# Patient Record
Sex: Male | Born: 1950
Health system: Southern US, Community
[De-identification: ages and names within clinical notes are randomized; demographics above are authoritative.]

## PROBLEM LIST (undated history)

## (undated) DIAGNOSIS — N200 Calculus of kidney: Secondary | ICD-10-CM

## (undated) DIAGNOSIS — E785 Hyperlipidemia, unspecified: Secondary | ICD-10-CM

## (undated) DIAGNOSIS — I2510 Atherosclerotic heart disease of native coronary artery without angina pectoris: Secondary | ICD-10-CM

## (undated) DIAGNOSIS — M109 Gout, unspecified: Secondary | ICD-10-CM

## (undated) DIAGNOSIS — C801 Malignant (primary) neoplasm, unspecified: Secondary | ICD-10-CM

## (undated) DIAGNOSIS — K219 Gastro-esophageal reflux disease without esophagitis: Secondary | ICD-10-CM

## (undated) DIAGNOSIS — K635 Polyp of colon: Secondary | ICD-10-CM

## (undated) DIAGNOSIS — H8102 Meniere's disease, left ear: Secondary | ICD-10-CM

## (undated) DIAGNOSIS — M1990 Unspecified osteoarthritis, unspecified site: Secondary | ICD-10-CM

## (undated) DIAGNOSIS — J9601 Acute respiratory failure with hypoxia: Secondary | ICD-10-CM

## (undated) DIAGNOSIS — I10 Essential (primary) hypertension: Secondary | ICD-10-CM

## (undated) HISTORY — DX: Calculus of kidney: N20.0

## (undated) HISTORY — PX: TONSILLECTOMY: SUR1361

## (undated) HISTORY — DX: Hyperlipidemia, unspecified: E78.5

## (undated) HISTORY — DX: Polyp of colon: K63.5

## (undated) HISTORY — DX: Meniere's disease, left ear: H81.02

## (undated) HISTORY — DX: Essential (primary) hypertension: I10

## (undated) HISTORY — DX: Gout, unspecified: M10.9

## (undated) HISTORY — DX: Atherosclerotic heart disease of native coronary artery without angina pectoris: I25.10

## (undated) HISTORY — DX: Gastro-esophageal reflux disease without esophagitis: K21.9

## (undated) HISTORY — DX: Malignant (primary) neoplasm, unspecified: C80.1

## (undated) HISTORY — DX: Unspecified osteoarthritis, unspecified site: M19.90

## (undated) HISTORY — DX: Acute respiratory failure with hypoxia: J96.01

## (undated) HISTORY — PX: ROTATOR CUFF REPAIR: SHX139

---

## 2000-09-26 ENCOUNTER — Encounter: Admission: RE | Admit: 2000-09-26 | Discharge: 2000-12-25 | Payer: Self-pay | Admitting: *Deleted

## 2003-10-18 ENCOUNTER — Encounter (INDEPENDENT_AMBULATORY_CARE_PROVIDER_SITE_OTHER): Payer: Self-pay | Admitting: Specialist

## 2003-10-18 ENCOUNTER — Ambulatory Visit (HOSPITAL_COMMUNITY): Admission: RE | Admit: 2003-10-18 | Discharge: 2003-10-18 | Payer: Self-pay | Admitting: Gastroenterology

## 2009-03-22 ENCOUNTER — Ambulatory Visit: Payer: Self-pay | Admitting: Sports Medicine

## 2009-03-22 DIAGNOSIS — M7520 Bicipital tendinitis, unspecified shoulder: Secondary | ICD-10-CM | POA: Insufficient documentation

## 2009-03-22 DIAGNOSIS — M25519 Pain in unspecified shoulder: Secondary | ICD-10-CM | POA: Insufficient documentation

## 2009-04-12 ENCOUNTER — Ambulatory Visit: Payer: Self-pay | Admitting: Sports Medicine

## 2009-04-20 ENCOUNTER — Emergency Department (HOSPITAL_COMMUNITY): Admission: EM | Admit: 2009-04-20 | Discharge: 2009-04-20 | Payer: Self-pay | Admitting: Emergency Medicine

## 2009-05-11 ENCOUNTER — Ambulatory Visit: Payer: Self-pay | Admitting: Sports Medicine

## 2010-02-20 NOTE — Assessment & Plan Note (Signed)
Summary: L SHOULDER STRAIN,MC   Vital Signs:  Patient profile:   60 year old male Height:      67 inches Weight:      165 pounds BMI:     25.94 BP sitting:   136 / 88  Vitals Entered By: Lillia Pauls CMA (March 22, 2009 9:31 AM)  CC:  left shoulder and bicep pain.  History of Present Illness: Pt has had left shoulder and bicep pain for about 3 weeks.  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  He notices that putting on shirts, reaching behind him, and certain rotations of the arm makes the pain worse.  Right now he has a constant mild ache but those motions bring on a sharp worsening of it.  Pain is located in the area of the bicipital groove and comes down into the bicep some. He has tried some advil with little relief.  Problems Prior to Update: 1)  Biceps Tendinitis, Left  (ICD-726.12)  Medications Prior to Update: 1)  None  Allergies (verified): No Known Drug Allergies  Review of Systems MS:  Complains of joint pain, muscle aches, and muscle weakness. Neuro:  Denies numbness and tingling.  Physical Exam  General:  Well-developed,well-nourished,in no acute distress; alert,appropriate and cooperative throughout examination Msk:  Shoulder: Inspection reveals no abnormalities, atrophy or asymmetry. Palpation reveals mild TTP at the bicipital groove. ROM is mostly normal but limited some with apley's scratch test on the left. Rotator cuff strength weak on the left especially external rotation, internal rotation strength normal. Speeds and Yergason's tests are both positive with significant pain at the bicipital groove and some weakness. No labral pathology noted with negative Obrien's, negative clunk and good stability. Normal scapular function observed. No painful arc and no drop arm sign. No apprehension sign No signs of impingement with negative Neer and Hawkin's tests, equivocal empty can.   U/S demonstrates  significant amount of edema within the left biceps tendon and partial tearing with minute calcifications  There is a partial tear on longitudinal scanning that looks to involve 40% of tendon thickness and has an associated fluid collection  Scanning of AC joint, Rotator cuff mm revealed all to appear normal  images saved   Neurologic:  sensation is intact int he upper extremities b/l Psych:  Cognition and judgment appear intact. Alert and cooperative with normal attention span and concentration.    Impression & Recommendations:  Problem # 1:  BICEPS TENDINITIS, LEFT (ICD-726.12)  partial tearing, large amount of swelling and edema within the tendon ice massage several times daily topical NSAID QID   - will use ketoprofen gel 20% light exercises prescribed to maintain ROM and strength return in 3 weeks to reevaluate  will need to rescan at that point  Orders: [place] LIMITED ([id number])  Problem # 2:  SHOULDER PAIN, LEFT (ICD-719.41)  prob 2/2 BT as noted above poain tolerable will allow OTC NSAIDs and topicals as above  Orders: [place] LIMITED ([id number])

## 2010-02-20 NOTE — Assessment & Plan Note (Signed)
Summary: f/u,mc   Vital Signs:  Patient profile:   60 year old male BP sitting:   129 / 87  Vitals Entered By: Lillia Pauls CMA (May 11, 2009 1:47 PM)  History of Present Illness: Nathaniel Bender is F/u of left Biciptial tendon partial tear state that pain is 80% less started lessening after NTG use fairly quickly still cautious w lifting pain only on heavier lift or on ER of arm though no real sideeffects w NTG p first few days of mild HA  Allergies: No Known Drug Allergies  Physical Exam  General:  Well-developed,well-nourished,in no acute distress; alert,appropriate and cooperative throughout examination Msk:  no deformity of left upper arm full ROM of shoulder speeds and yergason tests are not painful on mild to moderate resistance however, stronger resistance does cause some pain  ER w arm extension is still painful Additional Exam:  MSK [place] Scan is repeated today to track tendon healing now there is an area of fluid collection that surrounds BT at area of injury that is dec compared to other scans tendon is now 80% restored to norm structure with gap and tearing resolving comparison w 2 prior scnas show steady dec in fluid and steady inc in tendon fibers  images saved   Impression & Recommendations:  Problem # 1:  SHOULDER PAIN, LEFT (ICD-719.41)  pain is much less not using any meds most days  Orders: [place] LIMITED ([id number])  Problem # 2:  BICEPS TENDINITIS, LEFT (ICD-726.12)  this appears to be healign very well The high grade partial tear > 60% tendon width now appears down about 20% tendon width will cont NTG for at least 1 more mo if this is improved on RTC in 5 wks will prob be able to stop NTG  Orders: [place] LIMITED ([id number])  Complete Medication List: 1)  Nitroglycerin 0.2 Mg/hr Pt24 (Nitroglycerin) .... Use 1/4 patch once daily as directed 2)  Simvastatin 40 Mg Tabs (Simvastatin) .... [place] 1 qd 3)  Omeprazole 20 Mg Cpdr (Omeprazole) .Marland Kitchen.. 1 by mouth qd 4)   Amlodipine Besylate 5 Mg Tabs (Amlodipine besylate) .Marland Kitchen.. 1 by mouth qd Prescriptions: NITROGLYCERIN 0.2 MG/HR PT24 (NITROGLYCERIN) use 1/4 patch once daily as directed  #30 x 2   Entered by:   Lillia Pauls CMA   Authorized by:   Enid Baas MD   Signed by:   Lillia Pauls CMA on 05/11/2009   Method used:   [facility] to        [facility]  [place] [address] [facility]retail[facility]       [address]       [place], [place]  [address]       Ph: [phone] or [phone]       Fax: [phone]   RxID:   [id number]

## 2010-02-20 NOTE — Assessment & Plan Note (Signed)
Summary: f/u,mc   Vital Signs:  Patient profile:   60 year old male BP sitting:   138 / 80  Vitals Entered By: Lillia Pauls CMA (April 12, 2009 3:25 PM)  History of Present Illness: Nathaniel Bender returns follow of partial biceps tendon tear on left He has noted some mild improvement but still painful avoiding lifting anything heavy orig injury prob usign pitch fork  volt cream caused rash and so he could not use it but for 8 days after that pain has not continued to improve  Allergies: No Known Drug Allergies  Physical Exam  General:  Well-developed,well-nourished,in no acute distress; alert,appropriate and cooperative throughout examination Msk:  less pain on testing today Inspection reveals no abnormalities or assymetry; no atrophy noted; palpation is unremarkable;  ROM is full in all planes. specific strength testing of Rotator cuff mm reveals good strength throughout; no signs of impingement;  speeds and yergason's tests he can do with moderate resistance today which is better however on supination of forearm or on ER he gets pain over upper humerus/ biciptial groove .  negative painful arc and no drop arm sign.  Additional Exam:  MSK [place] There is no real change from 3 weeks ago w exception that there may be a few more fibers noted in long view tear is 2 cm distal to interval seen well on long and trans view   Impression & Recommendations:  Problem # 1:  SHOULDER PAIN, LEFT (ICD-719.41) only modest change  begin some easy motion exercises with light weight  no lifting  Problem # 2:  BICEPS TENDINITIS, LEFT (ICD-726.12) considering deg of tear will try on trial of ntg use daily as directed reck 1 mo  Complete Medication List: 1)  Nitroglycerin 0.2 Mg/hr Pt24 (Nitroglycerin) .... Use 1/4 patch once daily as directed 2)  Simvastatin 40 Mg Tabs (Simvastatin) .... [place] 1 qd 3)  Omeprazole 20 Mg Cpdr (Omeprazole) .Marland Kitchen.. 1 by mouth qd 4)  Amlodipine Besylate 5 Mg Tabs  (Amlodipine besylate) .Marland Kitchen.. 1 by mouth qd Prescriptions: NITROGLYCERIN 0.2 MG/HR PT24 (NITROGLYCERIN) use 1/4 patch once daily as directed  #30 x 1   Entered and Authorized by:   Enid Baas MD   Signed by:   Enid Baas MD on 04/12/2009   Method used:   [facility]        [facility]  [place] [address] [facility]       [address]       [place], [place]  [address]       Ph: [phone] or [phone]       Fax: [phone]   RxID:   [id number]

## 2010-06-08 NOTE — Op Note (Signed)
NAME:  Nathaniel Bender, Nathaniel Bender NO.:  [id number]   MEDICAL RECORD NO.:  [id number]          PATIENT TYPE:  AMB   LOCATION:  ENDO                         FACILITY:  [facility]   PHYSICIAN:  Graylin Shiver, M.D.   DATE OF BIRTH:  October 11, 1950   DATE OF PROCEDURE:  10/18/2003  DATE OF DISCHARGE:                                 OPERATIVE REPORT   PROCEDURE:  Colonoscopy with polypectomy.   SURGEON:   INDICATIONS FOR PROCEDURE:  Screening.   Informed consent was obtained after explanation of the risks of bleeding,  infection and perforation.   PREMEDICATION:  Fentanyl 75 mcg IV and Versed 7.5 mg IV.   DESCRIPTION OF PROCEDURE:  With the patient in the left lateral decubitus  position, a rectal examination was performed.  No masses were felt.  The  Olympus colonoscope was inserted into the rectum and advanced around the  colon to the cecum.  Cecal landmarks were identified.  The cecum and  ascending colon were normal.  The transverse colon was normal.  In the mid  descending colon there was a 5 mm sessile polyp snared and removed by snare  cautery technique.  The cautery site looked good and the polyp was  retrieved.  The sigmoid and rectum were normal.  He tolerated the procedure  well without complications.   IMPRESSION:  Small descending colon polyp.   PLAN:  The pathology will be checked.       SFG/MEDQ  D:  10/18/2003  T:  10/18/2003  Job:  [id number]   cc:   [facility] [employer]

## 2011-11-10 ENCOUNTER — Ambulatory Visit (INDEPENDENT_AMBULATORY_CARE_PROVIDER_SITE_OTHER): Payer: Managed Care, Other (non HMO) | Admitting: Family Medicine

## 2011-11-10 VITALS — BP 130/80 | HR 56 | Temp 98.0°F | Resp 17 | Ht 66.5 in | Wt 166.0 lb

## 2011-11-10 DIAGNOSIS — Z1159 Encounter for screening for other viral diseases: Secondary | ICD-10-CM

## 2011-11-10 DIAGNOSIS — Z23 Encounter for immunization: Secondary | ICD-10-CM

## 2011-11-10 DIAGNOSIS — H9319 Tinnitus, unspecified ear: Secondary | ICD-10-CM

## 2011-11-10 DIAGNOSIS — H9209 Otalgia, unspecified ear: Secondary | ICD-10-CM

## 2011-11-10 LAB — HEPATITIS C ANTIBODY: HCV Ab: NEGATIVE

## 2011-11-10 MED ORDER — PREDNISONE 20 MG PO TABS
ORAL_TABLET | ORAL | Status: DC
Start: 2011-11-10 — End: 2013-10-21

## 2011-11-10 NOTE — Progress Notes (Signed)
[facility] [address], [place] [address] [phone]  Date:  11/10/2011   Name:  Nathaniel Bender   DOB:  Aug 26, 1950   MRN:  [id number]  PCP:  Elie Confer, MD    Chief Complaint: Otalgia and Tinnitus   History of Present Illness:  Nathaniel Bender is a 61 y.o. very pleasant male patient who presents with the following:  3 or 4 days ago he noted onset of pain in his left ear which was fairly intense for a few hours.  The pain the became better.  He drove back home from vacation in [place] yesterday.  Last night he noted that he could not hear as well out of his left ear, and he heard a whistling in the left ear.  He actually thought that a friend was whistling, but it turned out to just be tinnitus.  He was then trying to practice his guitar but he could not hear well enough to do so.  No drainage from the ear.  He feels that he cannot hear sounds as clearly OR as loudly as usual.   No fever.  No cough, no runny nose, no ST.  Otherwise he feels well and is generally healthy.  He has not been swimming recently.    He would like to have his seasonal flu vaccine today, and would like to be screened for hep C as he has heard that this is now recommended for people in his age group.    Patient Active Problem List  Diagnosis  . SHOULDER PAIN, LEFT  . BICEPS TENDINITIS, LEFT    No past medical history on file.  No past surgical history on file.  History  Substance Use Topics  . Smoking status: Never Smoker   . Smokeless tobacco: Not on file  . Alcohol Use: Not on file    No family history on file.  No Known Allergies  Medication list has been reviewed and updated.  Current Outpatient Prescriptions on File Prior to Visit  Medication Sig Dispense Refill  . amLODipine-benazepril (LOTREL) 10-20 MG per capsule Take 1 capsule by mouth daily.      Marland Kitchen atorvastatin (LIPITOR) 10 MG tablet Take 10 mg by mouth daily.      Marland Kitchen omeprazole (PRILOSEC) 20 MG  capsule Take 20 mg by mouth daily.      . simvastatin (ZOCOR) 20 MG tablet Take 20 mg by mouth every evening.        Review of Systems:  As per HPI- otherwise negative.   Physical Examination: Filed Vitals:   11/10/11 1202  BP: 130/80  Pulse: 56  Temp: 98 F (36.7 C)  Resp: 17   Filed Vitals:   11/10/11 1202  Height: 5' 6.5" (1.689 m)  Weight: 166 lb (75.297 kg)   Body mass index is 26.39 kg/(m^2). Ideal Body Weight: Weight in (lb) to have BMI = 25: 156.9   GEN: WDWN, NAD, Non-toxic, A & O x 3 HEENT: Atraumatic, Normocephalic. Neck supple. No masses, No LAD.  Right TM and ear canal normal. Left ear canal is clear, no redness or debris.  The TM may have a tiny defect in the superior part, vs fluid behind the drum. Otherwise the drum is normal, shiny, no redness.  Oropharynx normal.  PEERL,EOMI.  Nasal cavity normal Ears and Nose: No external deformity. CV: RRR, No M/G/R. No JVD. No thrill. No extra heart sounds. PULM: CTA B, no wheezes, crackles, rhonchi. No retractions. No  resp. distress. No accessory muscle use. EXTR: No c/c/e NEURO Normal gait.  PSYCH: Normally interactive. Conversant. Not depressed or anxious appearing.  Calm demeanor.    Assessment and Plan: 1. Tinnitus  predniSONE (DELTASONE) 20 MG tablet  2. Need for hepatitis C screening test  Hepatitis C antibody  3. Ear pain  predniSONE (DELTASONE) 20 MG tablet   Leandre is having tinnitus of undetermined etiology.  Will try a short course of prednisone to see if it may resolve this problem.  If he is not better in the next couple of days we will have him see ENT for further evaluation and to look at his TM.  In the meantime he will let me know if he gets worse.  Also did Hep C screen and flu shot today.  He is aware that concomitant use of prednisone and the flu shot could decrease the efficacy of his vaccine, but he would like to go ahead and do the shot today for convenience sake.    Await hep C result and will  follow- up  Abbe Amsterdam, MD

## 2011-11-10 NOTE — Patient Instructions (Addendum)
We are going to try prednisone for your tinnitus (ringing in ears).  However, if you are not feeling better within the next couple of days please do give me a call and I will arrange for you to see ENT.  Let me know sooner if you get worse.

## 2011-11-11 NOTE — Progress Notes (Signed)
LMOM that Hep C was negative

## 2011-11-13 ENCOUNTER — Telehealth: Payer: Self-pay

## 2011-11-13 DIAGNOSIS — H9319 Tinnitus, unspecified ear: Secondary | ICD-10-CM

## 2011-11-13 DIAGNOSIS — H9209 Otalgia, unspecified ear: Secondary | ICD-10-CM

## 2011-11-13 NOTE — Telephone Encounter (Signed)
Pt called and wants referral because condition is not improving. He would like an ENT referral. He is available tomorrow or Friday until noon. Please call pt with referral. Is this ok Dr Patsy Lager?

## 2011-11-13 NOTE — Telephone Encounter (Signed)
Yes, please refer to ENT.  Can we please call tomorrow am and try to get him an appt for 10/24 or 10/25?  thanks

## 2011-11-14 NOTE — Telephone Encounter (Signed)
Pt CB and I gave him referral appt info. Pt agreed that he can make it to the appt. Faxed over referral info to [facility] at [facility]

## 2011-11-14 NOTE — Telephone Encounter (Signed)
Put order for referral in EPIC and called [facility] to set up appt. Was able to get appt today at 2:00 (pt arrive at 1:45) w/Dr Pollyann Kennedy. Tried to call pt at H, C and W #s and LM for him at each to [facility] ASAP about appt. [facility] [address], pt to bring ins card, med list and co-pay.

## 2011-11-20 ENCOUNTER — Encounter: Payer: Self-pay | Admitting: Family Medicine

## 2011-11-20 DIAGNOSIS — H9120 Sudden idiopathic hearing loss, unspecified ear: Secondary | ICD-10-CM

## 2011-11-20 HISTORY — DX: Sudden idiopathic hearing loss, unspecified ear: H91.20

## 2011-12-18 ENCOUNTER — Other Ambulatory Visit: Payer: Self-pay | Admitting: Otolaryngology

## 2011-12-18 DIAGNOSIS — H9190 Unspecified hearing loss, unspecified ear: Secondary | ICD-10-CM

## 2011-12-24 ENCOUNTER — Other Ambulatory Visit: Payer: Self-pay

## 2013-03-30 ENCOUNTER — Telehealth: Payer: Self-pay | Admitting: Internal Medicine

## 2013-03-30 MED ORDER — PREDNISONE 20 MG PO TABS
ORAL_TABLET | ORAL | Status: DC
Start: 2013-03-30 — End: 2013-10-21

## 2013-03-30 NOTE — Telephone Encounter (Signed)
Meds ordered this encounter  Medications  . predniSONE (DELTASONE) 20 MG tablet    Sig: 3/3/2/2/1/1 single daily dose for 6 days    Dispense:  12 tablet    Refill:  0   F/u if not well

## 2013-05-04 ENCOUNTER — Other Ambulatory Visit: Payer: Self-pay | Admitting: Dermatology

## 2013-05-04 DIAGNOSIS — D229 Melanocytic nevi, unspecified: Secondary | ICD-10-CM

## 2013-05-04 HISTORY — DX: Melanocytic nevi, unspecified: D22.9

## 2013-06-03 ENCOUNTER — Other Ambulatory Visit: Payer: Self-pay | Admitting: Dermatology

## 2013-07-21 DIAGNOSIS — C801 Malignant (primary) neoplasm, unspecified: Secondary | ICD-10-CM

## 2013-07-21 HISTORY — DX: Malignant (primary) neoplasm, unspecified: C80.1

## 2013-10-21 ENCOUNTER — Other Ambulatory Visit: Payer: Self-pay | Admitting: Internal Medicine

## 2013-10-21 MED ORDER — BENZONATATE 100 MG PO CAPS: 100.0000 mg | ORAL_CAPSULE | Freq: Two times a day (BID) | ORAL | Status: DC | PRN

## 2013-10-21 MED ORDER — PREDNISONE 20 MG PO TABS: ORAL_TABLET | ORAL | Status: DC

## 2013-10-21 NOTE — Progress Notes (Signed)
Cough/wheeze/fall reactive airways as is pattern resp well to pred in past Meds ordered this encounter  Medications  . predniSONE (DELTASONE) 20 MG tablet    Sig: 3/3/2/2/1/1 single daily dose for 6 days    Dispense:  12 tablet    Refill:  0  . benzonatate (TESSALON) 100 MG capsule    Sig: Take 1 capsule (100 mg total) by mouth 2 (two) times daily as needed for cough.    Dispense:  20 capsule    Refill:  0

## 2013-12-29 ENCOUNTER — Ambulatory Visit (INDEPENDENT_AMBULATORY_CARE_PROVIDER_SITE_OTHER): Payer: BC Managed Care – PPO | Admitting: Family Medicine

## 2013-12-29 ENCOUNTER — Encounter: Payer: Self-pay | Admitting: Family Medicine

## 2013-12-29 VITALS — BP 120/82 | HR 45 | Temp 98.2°F | Resp 16 | Ht 66.25 in | Wt 167.6 lb

## 2013-12-29 DIAGNOSIS — N521 Erectile dysfunction due to diseases classified elsewhere: Secondary | ICD-10-CM | POA: Diagnosis not present

## 2013-12-29 DIAGNOSIS — K219 Gastro-esophageal reflux disease without esophagitis: Secondary | ICD-10-CM

## 2013-12-29 DIAGNOSIS — E785 Hyperlipidemia, unspecified: Secondary | ICD-10-CM | POA: Diagnosis not present

## 2013-12-29 DIAGNOSIS — I10 Essential (primary) hypertension: Secondary | ICD-10-CM

## 2013-12-29 DIAGNOSIS — J9809 Other diseases of bronchus, not elsewhere classified: Secondary | ICD-10-CM

## 2013-12-29 DIAGNOSIS — Z23 Encounter for immunization: Secondary | ICD-10-CM

## 2013-12-29 DIAGNOSIS — R6882 Decreased libido: Secondary | ICD-10-CM | POA: Diagnosis not present

## 2013-12-29 MED ORDER — PREDNISONE 20 MG PO TABS: ORAL_TABLET | ORAL | Status: DC

## 2013-12-29 MED ORDER — SILDENAFIL CITRATE 100 MG PO TABS: 100.0000 mg | ORAL_TABLET | Freq: Every day | ORAL | Status: DC | PRN

## 2013-12-29 NOTE — Progress Notes (Addendum)
Subjective:    Patient ID: Nathaniel Bender, male    DOB: 12/24/1950, 63 y.o.   MRN: [id number]  12/29/2013  new pt; would like to discuss colonoscopy; and flu injection   HPI This 63 y.o. male presents to establish care.  Last physical: within past year. Colonoscopy:   2005; two polyps.   TDAP:  4 years ago.   Pneumovax: never Zostavax:  2013 Influenza:  Flu vaccine today. Eye exam:  New glasses yesterday; 2015; no g/c. Dental exam:  Every six months.  Recurrent bronchitis/bronchospasm.  Allergic induced; yearly.  Works with horses.  Prednisone works the best.  Coughing fits that last weeks that require multiple medications. Late fall or January.    [place] [facility]: too much sun; one skin cancer on R forearm six months ago.  Mother with skin cancer as well.    2013 acute hearing loss L:  Presented to [facility]; Prednisone rx; referred to ENT; Meniere's disease.  Recovered hearing completely 100% other than tinnitus.  Six weeks recovery.    Prostate screening:  Agreeable in future.  Libido/ED: onset in six months.  Admits to stressors; work is very busy. No marital issues or strain.    Hyperlipidemia: Patient reports good compliance with medication, good tolerance to medication, and good symptom control.  Maintained on Pravastatin 20mg  daily.  Denies HA/dizziness/focal weakness/paresthesias.  Hypertension: Patient reports good compliance with medication, good tolerance to medication, and good symptom control.  Maintained on Lisinopril 10mg  daily and Amlodipine 5mg  daily.  Denies CP/palp/SOB/leg swelling.  GERD: well controlled with Omeprazole 20mg  daily.  Denies n/v/d/c; denies bloody stools or melena.   Review of Systems  Constitutional: Negative for fever, chills, diaphoresis, activity change, appetite change and fatigue.  HENT: Positive for tinnitus. Negative for hearing loss.   Eyes: Negative for visual disturbance.  Respiratory: Negative for cough and shortness of breath.    Cardiovascular: Negative for chest pain, palpitations and leg swelling.  Endocrine: Negative for cold intolerance, heat intolerance, polydipsia, polyphagia and polyuria.  Neurological: Negative for dizziness, tremors, seizures, syncope, facial asymmetry, speech difficulty, weakness, light-headedness, numbness and headaches.    Past Medical History  Diagnosis Date  . Hyperlipidemia   . Hypertension   . GERD (gastroesophageal reflux disease)   . Gout   . Colon polyps   . Cancer 07/21/2013    Skin cancer unknown type  . Meniere's disease of left ear     s/p ENT evaluation; acute hearing loss; chronic tinnitus L now.  Recovered hearing completely.   Past Surgical History  Procedure Laterality Date  . Tonsillectomy     No Known Allergies History   Social History  . Marital Status: Married    Spouse Name: N/A  . Number of Children: N/A  . Years of Education: N/A   Occupational History  . Sales    Social History Main Topics  . Smoking status: Never Smoker   . Smokeless tobacco: Not on file  . Alcohol Use: 12.0 oz/week    10 Standard drinks or equivalent, 10 Glasses of wine per week  . Drug Use: No  . Sexual Activity: Yes   Other Topics Concern  . Not on file   Social History Narrative   Married. X 25 years.        Children: none      Lives: with wife       Education: College/Other. Exercise: Yes.      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   Family History  Problem Relation Age of Onset  . Hyperlipidemia Mother   . Hypertension Mother   . Diabetes Father   . Heart disease Father 33    CAD/CABG  . Hyperlipidemia Father   . Hypertension Father   . Hyperlipidemia Sister   . Hypertension Sister   . Heart disease Maternal Grandfather   . Mental illness Paternal Grandmother   . Stroke Paternal Grandfather         Objective:    BP 120/82 mmHg  Pulse 45  Temp(Src) 98.2 F (36.8 C) (Oral)  Resp 16  Ht 5' 6.25" (1.683 m)  Wt 167 lb 9.6 oz (76.023 kg)   BMI 26.84 kg/m2  SpO2 98% Physical Exam  Constitutional: He is oriented to person, place, and time. He appears well-developed and well-nourished. No distress.  HENT:  Head: Normocephalic and atraumatic.  Right Ear: External ear normal.  Left Ear: External ear normal.  Nose: Nose normal.  Mouth/Throat: Oropharynx is clear and moist.  Eyes: Conjunctivae and EOM are normal. Pupils are equal, round, and reactive to light.  Neck: Normal range of motion. Neck supple. Carotid bruit is not present. No thyromegaly present.  Cardiovascular: Normal rate, regular rhythm, normal heart sounds and intact distal pulses.  Exam reveals no gallop and no friction rub.   No murmur heard. Pulmonary/Chest: Effort normal and breath sounds normal. He has no wheezes. He has no rales.  Abdominal: Soft. Bowel sounds are normal. He exhibits no distension and no mass. There is no tenderness. There is no rebound and no guarding.  Lymphadenopathy:    He has no cervical adenopathy.  Neurological: He is alert and oriented to person, place, and time. No cranial nerve deficit.  Skin: Skin is warm and dry. No rash noted. He is not diaphoretic.  Psychiatric: He has a normal mood and affect. His behavior is normal.  Nursing note and vitals reviewed.  INFLUENZA VACCINE ADMINISTERED.     Assessment & Plan:   1. Essential hypertension, benign   2. Hyperlipidemia   3. Erectile dysfunction due to diseases classified elsewhere   4. Decreased libido   5. Flu vaccine need   6. Gastroesophageal reflux disease without esophagitis   7. Recurrent bronchospasm      1 HTN: Controlled; no changes to management; obtain labs at return visit. 2. Hyperlipidemia: controlled; no changes to management; obtain labs at return visit. 3.  GERD: controlled; no changes to management. 4.  ED:  New.  Secondary to stressors. Warrants TSH and testosterone level at next visit.  Rx for Viagra provided.  Side effects reviewed. 5. Decreased  libido: New.  Likely secondary to stressors; warrants testosterone level and TSH at next visit. 6.  S/p flu vaccine. 7. Bronchospasm recurrent: chronic; rx for Prednisone provided to use PRN.   Meds ordered this encounter  Medications  . PRESCRIPTION MEDICATION    Sig: Pravastatin 20 mg taking daily  . PRESCRIPTION MEDICATION    Sig: Lisinopril 10 mg taking daily  . OVER THE COUNTER MEDICATION    Sig: Aleve taking 3-5 tabs a week.  . sildenafil (VIAGRA) 100 MG tablet    Sig: Take 1 tablet (100 mg total) by mouth daily as needed for erectile dysfunction.    Dispense:  8 tablet    Refill:  5  . predniSONE (DELTASONE) 20 MG tablet    Sig: 3/3/2/2/1/1 single daily dose for 6 days    Dispense:  12 tablet    Refill:  0    Return  in about 6 months (around 06/30/2014) for complete physical examiniation.    Reginia Forts, M.D.  [facility] [address] [place], [place]  [address] [phone] phone [phone] fax

## 2013-12-29 NOTE — Progress Notes (Signed)
   Subjective:    Patient ID: Nathaniel Bender, male    DOB: 08/29/1950, 63 y.o.   MRN: [id number]  HPI    Review of Systems  Constitutional: Negative.   HENT: Negative.   Eyes: Negative.   Respiratory: Negative.   Cardiovascular: Negative.   Gastrointestinal: Negative.   Endocrine: Negative.   Genitourinary: Negative.   Musculoskeletal: Negative.   Skin: Negative.   Allergic/Immunologic: Negative.   Neurological: Negative.   Hematological: Negative.   Psychiatric/Behavioral: Negative.        Objective:   Physical Exam        Assessment & Plan:

## 2013-12-30 ENCOUNTER — Telehealth: Payer: Self-pay

## 2013-12-30 NOTE — Telephone Encounter (Signed)
PA needed for viagra for ED. Completed on covermymeds. Pending.

## 2013-12-31 NOTE — Telephone Encounter (Signed)
PA approved through 12/30/14. Notified pharm.

## 2014-01-19 ENCOUNTER — Encounter: Payer: Self-pay | Admitting: Family Medicine

## 2014-01-19 DIAGNOSIS — E785 Hyperlipidemia, unspecified: Secondary | ICD-10-CM | POA: Insufficient documentation

## 2014-01-19 DIAGNOSIS — I10 Essential (primary) hypertension: Secondary | ICD-10-CM | POA: Insufficient documentation

## 2014-03-29 ENCOUNTER — Encounter: Payer: Self-pay | Admitting: Family Medicine

## 2014-03-29 DIAGNOSIS — R6882 Decreased libido: Secondary | ICD-10-CM | POA: Insufficient documentation

## 2014-03-29 DIAGNOSIS — K219 Gastro-esophageal reflux disease without esophagitis: Secondary | ICD-10-CM | POA: Insufficient documentation

## 2014-03-29 DIAGNOSIS — N521 Erectile dysfunction due to diseases classified elsewhere: Secondary | ICD-10-CM | POA: Insufficient documentation

## 2014-04-18 LAB — HM COLONOSCOPY

## 2014-05-03 ENCOUNTER — Encounter: Payer: Self-pay | Admitting: *Deleted

## 2014-07-06 ENCOUNTER — Ambulatory Visit (INDEPENDENT_AMBULATORY_CARE_PROVIDER_SITE_OTHER): Payer: BLUE CROSS/BLUE SHIELD | Admitting: Family Medicine

## 2014-07-06 ENCOUNTER — Encounter: Payer: BC Managed Care – PPO | Admitting: Family Medicine

## 2014-07-06 ENCOUNTER — Other Ambulatory Visit: Payer: Self-pay | Admitting: Family Medicine

## 2014-07-06 VITALS — BP 123/75 | HR 58 | Temp 98.5°F | Resp 17 | Ht 66.5 in | Wt 169.0 lb

## 2014-07-06 DIAGNOSIS — E785 Hyperlipidemia, unspecified: Secondary | ICD-10-CM

## 2014-07-06 DIAGNOSIS — K219 Gastro-esophageal reflux disease without esophagitis: Secondary | ICD-10-CM

## 2014-07-06 DIAGNOSIS — R6882 Decreased libido: Secondary | ICD-10-CM | POA: Diagnosis not present

## 2014-07-06 DIAGNOSIS — N529 Male erectile dysfunction, unspecified: Secondary | ICD-10-CM | POA: Diagnosis not present

## 2014-07-06 DIAGNOSIS — Z131 Encounter for screening for diabetes mellitus: Secondary | ICD-10-CM

## 2014-07-06 DIAGNOSIS — Z125 Encounter for screening for malignant neoplasm of prostate: Secondary | ICD-10-CM

## 2014-07-06 DIAGNOSIS — Z0000 Encounter for general adult medical examination without abnormal findings: Secondary | ICD-10-CM

## 2014-07-06 DIAGNOSIS — I10 Essential (primary) hypertension: Secondary | ICD-10-CM

## 2014-07-06 LAB — POCT URINALYSIS DIPSTICK
Bilirubin, UA: NEGATIVE
Blood, UA: NEGATIVE
GLUCOSE UA: NEGATIVE
KETONES UA: NEGATIVE
Leukocytes, UA: NEGATIVE
Nitrite, UA: NEGATIVE
Protein, UA: NEGATIVE
SPEC GRAV UA: 1.015
Urobilinogen, UA: 0.2
pH, UA: 6.5

## 2014-07-06 LAB — CBC WITH DIFFERENTIAL/PLATELET
BASOS ABS: 0.1 10*3/uL (ref 0.0–0.1)
BASOS PCT: 1 % (ref 0–1)
EOS ABS: 0.2 10*3/uL (ref 0.0–0.7)
EOS PCT: 4 % (ref 0–5)
HCT: 44.9 % (ref 39.0–52.0)
Hemoglobin: 15.7 g/dL (ref 13.0–17.0)
Lymphocytes Relative: 40 % (ref 12–46)
Lymphs Abs: 2.3 10*3/uL (ref 0.7–4.0)
MCH: 30.7 pg (ref 26.0–34.0)
MCHC: 35 g/dL (ref 30.0–36.0)
MCV: 87.9 fL (ref 78.0–100.0)
MPV: 11.7 fL (ref 8.6–12.4)
Monocytes Absolute: 0.3 10*3/uL (ref 0.1–1.0)
Monocytes Relative: 6 % (ref 3–12)
Neutro Abs: 2.8 10*3/uL (ref 1.7–7.7)
Neutrophils Relative %: 49 % (ref 43–77)
PLATELETS: 156 10*3/uL (ref 150–400)
RBC: 5.11 MIL/uL (ref 4.22–5.81)
RDW: 13.5 % (ref 11.5–15.5)
WBC: 5.8 10*3/uL (ref 4.0–10.5)

## 2014-07-06 LAB — LIPID PANEL
CHOL/HDL RATIO: 5.5 ratio
Cholesterol: 220 mg/dL — ABNORMAL HIGH (ref 0–200)
HDL: 40 mg/dL (ref >=40)
LDL CALC: 125 mg/dL — AB (ref 0–99)
TRIGLYCERIDES: 275 mg/dL — AB (ref <150)
VLDL: 55 mg/dL — ABNORMAL HIGH (ref 0–40)

## 2014-07-06 LAB — COMPREHENSIVE METABOLIC PANEL
ALBUMIN: 5 g/dL (ref 3.5–5.2)
ALK PHOS: 59 U/L (ref 39–117)
ALT: 21 U/L (ref 0–53)
AST: 20 U/L (ref 0–37)
BILIRUBIN TOTAL: 0.8 mg/dL (ref 0.2–1.2)
BUN: 18 mg/dL (ref 6–23)
CO2: 28 mEq/L (ref 19–32)
CREATININE: 0.99 mg/dL (ref 0.50–1.35)
Calcium: 9.8 mg/dL (ref 8.4–10.5)
Chloride: 100 mEq/L (ref 96–112)
GLUCOSE: 97 mg/dL (ref 70–99)
Potassium: 4.2 mEq/L (ref 3.5–5.3)
Sodium: 138 mEq/L (ref 135–145)
Total Protein: 7.8 g/dL (ref 6.0–8.3)

## 2014-07-06 LAB — HEMOGLOBIN A1C
HEMOGLOBIN A1C: 5.9 % — AB (ref <5.7)
Mean Plasma Glucose: 123 mg/dL — ABNORMAL HIGH (ref <117)

## 2014-07-06 LAB — TESTOSTERONE: Testosterone: 242 ng/dL — ABNORMAL LOW (ref 300–890)

## 2014-07-06 LAB — TSH: TSH: 1.842 u[IU]/mL (ref 0.350–4.500)

## 2014-07-06 MED ORDER — SILDENAFIL CITRATE 100 MG PO TABS: 100.0000 mg | ORAL_TABLET | Freq: Every day | ORAL | Status: DC | PRN

## 2014-07-06 NOTE — Patient Instructions (Signed)

## 2014-07-06 NOTE — Progress Notes (Signed)
Subjective:    Patient ID: Nathaniel Bender, male    DOB: [birth year], 64 y.o.   MRN: [id number]  07/06/2014  Annual Exam   HPI This 64 y.o. male presents for Complete Physical Examination.  Last physical:  One year ago atleast. Colonoscopy:  2005; two polyps.  S/p repeat 2015; no polyps; [facility]; repeat in 10 years. TDAP:  4 years ago at [facility].  2010. Pneumovax:  never Zostavax:  2012 Influenza:  12/29/2013 Eye exam:  +glasses; 2015; no glaucoma or cataracts. Dental exam:  Every six months.    Review of Systems  Constitutional: Negative for fever, chills, diaphoresis, activity change, appetite change, fatigue and unexpected weight change.  HENT: Negative for congestion, dental problem, drooling, ear discharge, ear pain, facial swelling, hearing loss, mouth sores, nosebleeds, postnasal drip, rhinorrhea, sinus pressure, sneezing, sore throat, tinnitus, trouble swallowing and voice change.   Eyes: Negative for photophobia, pain, discharge, redness, itching and visual disturbance.  Respiratory: Negative for apnea, cough, choking, chest tightness, shortness of breath, wheezing and stridor.   Cardiovascular: Negative for chest pain, palpitations and leg swelling.  Gastrointestinal: Negative for nausea, vomiting, abdominal pain, diarrhea, constipation and blood in stool.  Endocrine: Negative for cold intolerance, heat intolerance, polydipsia, polyphagia and polyuria.  Genitourinary: Negative for dysuria, urgency, frequency, hematuria, flank pain, decreased urine volume, discharge, penile swelling, scrotal swelling, enuresis, difficulty urinating, genital sores, penile pain and testicular pain.  Musculoskeletal: Negative for myalgias, back pain, joint swelling, arthralgias, gait problem, neck pain and neck stiffness.  Skin: Positive for color change. Negative for pallor, rash and wound.  Allergic/Immunologic: Negative for environmental allergies, food allergies and immunocompromised state.    Neurological: Negative for dizziness, tremors, seizures, syncope, facial asymmetry, speech difficulty, weakness, light-headedness, numbness and headaches.  Hematological: Negative for adenopathy. Does not bruise/bleed easily.  Psychiatric/Behavioral: Negative for suicidal ideas, hallucinations, behavioral problems, confusion, sleep disturbance, self-injury, dysphoric mood, decreased concentration and agitation. The patient is not nervous/anxious and is not hyperactive.     Past Medical History  Diagnosis Date  . Hyperlipidemia   . Hypertension   . GERD (gastroesophageal reflux disease)   . Gout   . Colon polyps   . Cancer 07/21/2013    Skin cancer unknown type  . Meniere's disease of left ear     s/p ENT evaluation; acute hearing loss; chronic tinnitus L now.  Recovered hearing completely.  . Arthritis     Degenerative Disc Disease Cervical; s/p injection [facility].   Past Surgical History  Procedure Laterality Date  . Tonsillectomy     No Known Allergies Current Outpatient Prescriptions  Medication Sig Dispense Refill  . amLODipine-benazepril (LOTREL) 10-20 MG per capsule Take 1 capsule by mouth daily.    Marland Kitchen atorvastatin (LIPITOR) 10 MG tablet Take 10 mg by mouth daily.    Marland Kitchen omeprazole (PRILOSEC) 20 MG capsule Take 20 mg by mouth daily.    Marland Kitchen OVER THE COUNTER MEDICATION Aleve taking 3-5 tabs a week.    Marland Kitchen PRESCRIPTION MEDICATION Pravastatin 20 mg taking daily    . PRESCRIPTION MEDICATION Lisinopril 10 mg taking daily    . sildenafil (VIAGRA) 100 MG tablet Take 1 tablet (100 mg total) by mouth daily as needed for erectile dysfunction. 8 tablet 5  . simvastatin (ZOCOR) 20 MG tablet Take 20 mg by mouth every evening.     No current facility-administered medications for this visit.   History   Social History  . Marital Status: Married    Spouse Name:  N/A  . Number of Children: N/A  . Years of Education: N/A   Occupational History  . Sales    Social History Main Topics   . Smoking status: Never Smoker   . Smokeless tobacco: Not on file  . Alcohol Use: 12.0 oz/week    10 Glasses of wine, 10 Standard drinks or equivalent per week  . Drug Use: No  . Sexual Activity: Yes   Other Topics Concern  . Not on file   Social History Narrative   Married. X 25 years.        Children: none      Lives: with wife       Education: College/Other. Exercise: Yes.      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      Tobacco: never      Alcohol:  2 per day; more on weekends; wine      Exercise:  Walking 4 days per week; 2-4 miles.      Seatbelt:  100%      Guns: 12 gauge shot gun.   Family History  Problem Relation Age of Onset  . Hyperlipidemia Mother   . Hypertension Mother   . Arthritis Mother 70    DDD lumbar spine  . Diabetes Father   . Heart disease Father 65    CAD/CABG  . Hyperlipidemia Father   . Hypertension Father   . Hyperlipidemia Sister   . Hypertension Sister   . Heart disease Maternal Grandfather   . Mental illness Paternal Grandmother   . Stroke Paternal Grandfather        Objective:    BP 123/75 mmHg  Pulse 58  Temp(Src) 98.5 F (36.9 C) (Oral)  Resp 17  Ht 5' 6.5" (1.689 m)  Wt 169 lb (76.658 kg)  BMI 26.87 kg/m2  SpO2 98% Physical Exam  Constitutional: He is oriented to person, place, and time. He appears well-developed and well-nourished. No distress.  HENT:  Head: Normocephalic and atraumatic.  Right Ear: External ear normal.  Left Ear: External ear normal.  Nose: Nose normal.  Mouth/Throat: Oropharynx is clear and moist.  Eyes: Conjunctivae and EOM are normal. Pupils are equal, round, and reactive to light.  Neck: Normal range of motion. Neck supple. Carotid bruit is not present. No thyromegaly present.  Cardiovascular: Normal rate, regular rhythm, normal heart sounds and intact distal pulses.  Exam reveals no gallop and no friction rub.   No murmur heard. Pulmonary/Chest: Effort normal and breath sounds normal. He  has no wheezes. He has no rales.  Abdominal: Soft. Bowel sounds are normal. He exhibits no distension and no mass. There is no tenderness. There is no rebound and no guarding. Hernia confirmed negative in the right inguinal area and confirmed negative in the left inguinal area.  Genitourinary: Rectum normal, prostate normal, testes normal and penis normal. Prostate is not enlarged and not tender. Right testis shows no mass, no swelling and no tenderness. Left testis shows no mass, no swelling and no tenderness. Circumcised.  Musculoskeletal:       Right shoulder: Normal.       Left shoulder: Normal.       Cervical back: Normal.  Lymphadenopathy:    He has no cervical adenopathy.       Right: No inguinal adenopathy present.       Left: No inguinal adenopathy present.  Neurological: He is alert and oriented to person, place, and time. He has normal reflexes. No cranial nerve deficit.  He exhibits normal muscle tone. Coordination normal.  Skin: Skin is warm and dry. No rash noted. He is not diaphoretic.  Scattered sun related changes present on face.  Psychiatric: He has a normal mood and affect. His behavior is normal. Judgment and thought content normal.   Results for orders placed or performed in visit on 05/03/14  HM COLONOSCOPY  Result Value Ref Range   HM Colonoscopy      Impression: the entire examined colon is normal. No specimes collected.       Assessment & Plan:   1. Routine physical examination   2. Essential hypertension, benign   3. Hyperlipidemia   4. Screening for diabetes mellitus   5. Screening for prostate cancer   6. Impotence   7. Decreased libido     1.  Complete Physical Examination: anticipatory guidance -- start ASA 81mg  daily; moderate alcohol intake, weight loss.  Colonoscopy UTD.  Immunizations UTD.   2.  HTN: conrolled; obtain EKG that reveals marked sinus bradycardia.  Obtain labs.  3.  Hyperlipidemia: controlled; obtain labs; continue current  medications. 4.  Screening DMII: obtain glucose, HgbA1c. 5.  Screening prostate cancer; s/p DRE; obtain PSA. 6.  Impotence: improved with Viagra; refill provided; obtain testosterone and TSH. 7.  Decreased libido: New. Obtain TSH and testosterone level. 8.GERD: controlled; discussed long term complications of PPI use.  Meds ordered this encounter  Medications  . sildenafil (VIAGRA) 100 MG tablet    Sig: Take 1 tablet (100 mg total) by mouth daily as needed for erectile dysfunction.    Dispense:  8 tablet    Refill:  5    Return in about 6 months (around 01/05/2015) for recheck high blood pressure, high cholesterol.    Kristi Elayne Guerin, M.D. [facility] [address] [place], [place]  [address] [phone] phone [phone] fax

## 2014-07-07 ENCOUNTER — Encounter: Payer: Self-pay | Admitting: Family Medicine

## 2014-07-07 LAB — PSA: PSA: 1.59 ng/mL (ref <=4.00)

## 2014-07-08 LAB — PROLACTIN: PROLACTIN: 5.8 ng/mL (ref 2.1–17.1)

## 2014-07-08 LAB — FOLLICLE STIMULATING HORMONE: FSH: 7.9 m[IU]/mL (ref 1.4–18.1)

## 2014-08-04 ENCOUNTER — Other Ambulatory Visit: Payer: Self-pay | Admitting: Dermatology

## 2014-08-08 DIAGNOSIS — C4492 Squamous cell carcinoma of skin, unspecified: Secondary | ICD-10-CM

## 2014-08-08 HISTORY — DX: Squamous cell carcinoma of skin, unspecified: C44.92

## 2015-01-11 ENCOUNTER — Ambulatory Visit: Payer: Self-pay | Admitting: Family Medicine

## 2015-01-13 ENCOUNTER — Ambulatory Visit (INDEPENDENT_AMBULATORY_CARE_PROVIDER_SITE_OTHER): Payer: BLUE CROSS/BLUE SHIELD | Admitting: Family Medicine

## 2015-01-13 ENCOUNTER — Encounter: Payer: Self-pay | Admitting: Family Medicine

## 2015-01-13 VITALS — BP 120/82 | HR 51 | Temp 97.6°F | Resp 16 | Ht 66.75 in | Wt 169.0 lb

## 2015-01-13 DIAGNOSIS — R7303 Prediabetes: Secondary | ICD-10-CM

## 2015-01-13 DIAGNOSIS — K219 Gastro-esophageal reflux disease without esophagitis: Secondary | ICD-10-CM | POA: Diagnosis not present

## 2015-01-13 DIAGNOSIS — E291 Testicular hypofunction: Secondary | ICD-10-CM | POA: Diagnosis not present

## 2015-01-13 DIAGNOSIS — R7302 Impaired glucose tolerance (oral): Secondary | ICD-10-CM | POA: Diagnosis not present

## 2015-01-13 DIAGNOSIS — J9801 Acute bronchospasm: Secondary | ICD-10-CM | POA: Diagnosis not present

## 2015-01-13 DIAGNOSIS — Z114 Encounter for screening for human immunodeficiency virus [HIV]: Secondary | ICD-10-CM

## 2015-01-13 DIAGNOSIS — E785 Hyperlipidemia, unspecified: Secondary | ICD-10-CM | POA: Diagnosis not present

## 2015-01-13 DIAGNOSIS — I10 Essential (primary) hypertension: Secondary | ICD-10-CM

## 2015-01-13 DIAGNOSIS — R6882 Decreased libido: Secondary | ICD-10-CM

## 2015-01-13 DIAGNOSIS — N521 Erectile dysfunction due to diseases classified elsewhere: Secondary | ICD-10-CM | POA: Diagnosis not present

## 2015-01-13 DIAGNOSIS — Z23 Encounter for immunization: Secondary | ICD-10-CM | POA: Diagnosis not present

## 2015-01-13 LAB — COMPREHENSIVE METABOLIC PANEL
ALBUMIN: 4.6 g/dL (ref 3.6–5.1)
ALT: 23 U/L (ref 9–46)
AST: 22 U/L (ref 10–35)
Alkaline Phosphatase: 51 U/L (ref 40–115)
BUN: 19 mg/dL (ref 7–25)
CHLORIDE: 102 mmol/L (ref 98–110)
CO2: 27 mmol/L (ref 20–31)
CREATININE: 1.05 mg/dL (ref 0.70–1.25)
Calcium: 9.4 mg/dL (ref 8.6–10.3)
GLUCOSE: 103 mg/dL — AB (ref 65–99)
Potassium: 3.9 mmol/L (ref 3.5–5.3)
SODIUM: 138 mmol/L (ref 135–146)
Total Bilirubin: 0.8 mg/dL (ref 0.2–1.2)
Total Protein: 7 g/dL (ref 6.1–8.1)

## 2015-01-13 LAB — LUTEINIZING HORMONE: LH: 2.1 m[IU]/mL (ref 1.5–9.3)

## 2015-01-13 LAB — LIPID PANEL
Cholesterol: 200 mg/dL (ref 125–200)
HDL: 36 mg/dL — AB (ref >=40)
LDL CALC: 106 mg/dL (ref <130)
TRIGLYCERIDES: 291 mg/dL — AB (ref <150)
Total CHOL/HDL Ratio: 5.6 Ratio — ABNORMAL HIGH (ref <=5.0)
VLDL: 58 mg/dL — AB (ref <30)

## 2015-01-13 LAB — TESTOSTERONE: Testosterone: 210 ng/dL — ABNORMAL LOW (ref 300–890)

## 2015-01-13 LAB — PROLACTIN: Prolactin: 6.2 ng/mL (ref 2.1–17.1)

## 2015-01-13 LAB — HIV ANTIBODY (ROUTINE TESTING W REFLEX): HIV: NONREACTIVE

## 2015-01-13 LAB — FOLLICLE STIMULATING HORMONE: FSH: 6.6 m[IU]/mL (ref 1.4–18.1)

## 2015-01-13 MED ORDER — PREDNISONE 20 MG PO TABS: ORAL_TABLET | ORAL | Status: DC

## 2015-01-13 MED ORDER — TADALAFIL 20 MG PO TABS: 10.0000 mg | ORAL_TABLET | ORAL | Status: DC | PRN

## 2015-01-13 MED ORDER — LOSARTAN POTASSIUM 50 MG PO TABS: 50.0000 mg | ORAL_TABLET | Freq: Every day | ORAL | Status: DC

## 2015-01-13 NOTE — Progress Notes (Signed)
Subjective:    Patient ID: Nathaniel Bender, male    DOB: [birth year], 64 y.o.   MRN: [id number]  01/13/2015  Follow-up   HPI This 64 y.o. male presents for six month follow-up:  1. HTN:   Patient reports good compliance with medication, good tolerance to medication, and good symptom control.   Rarely checks BP at home; felt weird two weeks ago; BP 150s.  Repeat in 130s twenty minutes later. Dizziness at that time; passed quickly; duration 20 minutes. No skipped meals. Taking Lisinopril and Amlodipine which are not on patient's medication lists.  Suffering with cough after taking Lisinopril every night.  2.  Hyperlipidemia: taking Pravastatin therapy which is not on medication list.  Colonoscopy negative since last visit.  3.  GERD: Omeprazole daily x several years.  If does not take Omeprazole, has GI upset in afternoon.  4.  Hypogonadism:  Testosterone level low at CPE six months ago; suffering with decreased libido and ED. Decreased sex drive.  ED is persistent.  Viagra works well.  Interested in trial of Cialis. Suffers with flushing with Viagra.  No other side effects.    Basal cell carcinoma: facial.  Tafeen.  Flu vaccine today.  Requesting refill of Prednisone; usually suffers once per year with bronchospasm associated with acute illness. Used last rx in 01/2014.  Review of Systems  Constitutional: Negative for fever, chills, diaphoresis, activity change, appetite change and fatigue.  Respiratory: Negative for cough and shortness of breath.   Cardiovascular: Negative for chest pain, palpitations and leg swelling.  Gastrointestinal: Negative for nausea, vomiting, abdominal pain and diarrhea.  Endocrine: Negative for cold intolerance, heat intolerance, polydipsia, polyphagia and polyuria.  Skin: Negative for color change, rash and wound.  Neurological: Negative for dizziness, tremors, seizures, syncope, facial asymmetry, speech difficulty, weakness, light-headedness, numbness and  headaches.  Psychiatric/Behavioral: Negative for sleep disturbance and dysphoric mood. The patient is not nervous/anxious.     Past Medical History  Diagnosis Date  . Hyperlipidemia   . Hypertension   . GERD (gastroesophageal reflux disease)   . Gout   . Colon polyps   . Cancer ([place]) 07/21/2013    Skin cancer unknown type  . Meniere's disease of left ear     s/p ENT evaluation; acute hearing loss; chronic tinnitus L now.  Recovered hearing completely.  . Arthritis     Degenerative Disc Disease Cervical; s/p injection [facility].   Past Surgical History  Procedure Laterality Date  . Tonsillectomy     No Known Allergies  Social History   Social History  . Marital Status: Married    Spouse Name: N/A  . Number of Children: N/A  . Years of Education: N/A   Occupational History  . Sales    Social History Main Topics  . Smoking status: Never Smoker   . Smokeless tobacco: Not on file  . Alcohol Use: 12.0 oz/week    10 Glasses of wine, 10 Standard drinks or equivalent per week  . Drug Use: No  . Sexual Activity: Yes   Other Topics Concern  . Not on file   Social History Narrative   Married. X 25 years.        Children: none      Lives: with wife       Education: College/Other. Exercise: Yes.      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      Tobacco: never      Alcohol:  2 per day; more on  weekends; wine      Exercise:  Walking 4 days per week; 2-4 miles.      Seatbelt:  100%      Guns: 12 gauge shot gun.   Family History  Problem Relation Age of Onset  . Hyperlipidemia Mother   . Hypertension Mother   . Arthritis Mother 39    DDD lumbar spine  . Diabetes Father   . Heart disease Father 14    CAD/CABG  . Hyperlipidemia Father   . Hypertension Father   . Hyperlipidemia Sister   . Hypertension Sister   . Heart disease Maternal Grandfather   . Mental illness Paternal Grandmother   . Stroke Paternal Grandfather        Objective:    BP 120/82  mmHg  Pulse 51  Temp(Src) 97.6 F (36.4 C) (Oral)  Resp 16  Ht 5' 6.75" (1.695 m)  Wt 169 lb (76.658 kg)  BMI 26.68 kg/m2  SpO2 98% Physical Exam  Constitutional: He is oriented to person, place, and time. He appears well-developed and well-nourished. No distress.  HENT:  Head: Normocephalic and atraumatic.  Right Ear: External ear normal.  Left Ear: External ear normal.  Nose: Nose normal.  Mouth/Throat: Oropharynx is clear and moist.  Eyes: Conjunctivae and EOM are normal. Pupils are equal, round, and reactive to light.  Neck: Normal range of motion. Neck supple. Carotid bruit is not present. No thyromegaly present.  Cardiovascular: Normal rate, regular rhythm, normal heart sounds and intact distal pulses.  Exam reveals no gallop and no friction rub.   No murmur heard. Pulmonary/Chest: Effort normal and breath sounds normal. He has no wheezes. He has no rales.  Abdominal: Soft. Bowel sounds are normal. He exhibits no distension and no mass. There is no tenderness. There is no rebound and no guarding.  Lymphadenopathy:    He has no cervical adenopathy.  Neurological: He is alert and oriented to person, place, and time. No cranial nerve deficit.  Skin: Skin is warm and dry. No rash noted. He is not diaphoretic.  Psychiatric: He has a normal mood and affect. His behavior is normal.  Nursing note and vitals reviewed.  Results for orders placed or performed in visit on 07/06/14  CBC with Differential/Platelet  Result Value Ref Range   WBC 5.8 4.0 - 10.5 K/uL   RBC 5.11 4.22 - 5.81 MIL/uL   Hemoglobin 15.7 13.0 - 17.0 g/dL   HCT 44.9 39.0 - 52.0 %   MCV 87.9 78.0 - 100.0 fL   MCH 30.7 26.0 - 34.0 pg   MCHC 35.0 30.0 - 36.0 g/dL   RDW 13.5 11.5 - 15.5 %   Platelets 156 150 - 400 K/uL   MPV 11.7 8.6 - 12.4 fL   Neutrophils Relative % 49 43 - 77 %   Neutro Abs 2.8 1.7 - 7.7 K/uL   Lymphocytes Relative 40 12 - 46 %   Lymphs Abs 2.3 0.7 - 4.0 K/uL   Monocytes Relative 6 3 - 12 %    Monocytes Absolute 0.3 0.1 - 1.0 K/uL   Eosinophils Relative 4 0 - 5 %   Eosinophils Absolute 0.2 0.0 - 0.7 K/uL   Basophils Relative 1 0 - 1 %   Basophils Absolute 0.1 0.0 - 0.1 K/uL   Smear Review Criteria for review not met   Comprehensive metabolic panel  Result Value Ref Range   Sodium 138 135 - 145 mEq/L   Potassium 4.2 3.5 - 5.3 mEq/L  Chloride 100 96 - 112 mEq/L   CO2 28 19 - 32 mEq/L   Glucose, Bld 97 70 - 99 mg/dL   BUN 18 6 - 23 mg/dL   Creat 0.99 0.50 - 1.35 mg/dL   Total Bilirubin 0.8 0.2 - 1.2 mg/dL   Alkaline Phosphatase 59 39 - 117 U/L   AST 20 0 - 37 U/L   ALT 21 0 - 53 U/L   Total Protein 7.8 6.0 - 8.3 g/dL   Albumin 5.0 3.5 - 5.2 g/dL   Calcium 9.8 8.4 - 10.5 mg/dL  Hemoglobin A1c  Result Value Ref Range   Hgb A1c MFr Bld 5.9 (H) <5.7 %   Mean Plasma Glucose 123 (H) <117 mg/dL  Lipid panel  Result Value Ref Range   Cholesterol 220 (H) 0 - 200 mg/dL   Triglycerides 275 (H) <150 mg/dL   HDL 40 >=40 mg/dL   Total CHOL/HDL Ratio 5.5 Ratio   VLDL 55 (H) 0 - 40 mg/dL   LDL Cholesterol 125 (H) 0 - 99 mg/dL  TSH  Result Value Ref Range   TSH 1.842 0.350 - 4.500 uIU/mL  PSA  Result Value Ref Range   PSA 1.59 <=4.00 ng/mL  Testosterone  Result Value Ref Range   Testosterone 242 (L) 300 - 890 ng/dL  POCT urinalysis dipstick  Result Value Ref Range   Color, UA yellow    Clarity, UA clear    Glucose, UA neg    Bilirubin, UA neg    Ketones, UA neg    Spec Grav, UA 1.015    Blood, UA neg    pH, UA 6.5    Protein, UA neg    Urobilinogen, UA 0.2    Nitrite, UA neg    Leukocytes, UA Negative Negative       Assessment & Plan:   1. Hyperlipidemia   2. Essential hypertension, benign   3. Gastroesophageal reflux disease without esophagitis   4. Erectile dysfunction due to diseases classified elsewhere   5. Decreased libido   6. Screening for HIV (human immunodeficiency virus)   7. Hypogonadism in male   8. Need for influenza vaccination    1.  Hyperlipidemia: controlled; obtain labs; refills provided. 2.  HTN; controlled; obtain labs; change Lisinopril to Losartan '50mg'$  daily. 3.  GERD: controlled; no changes to therapy. 4.  ED: controlled; rx for Cialis provided. 5.  Screening HIV; obtain HIV per CDC guidelines. 6.  Hypogonadism: New; repeat today with FSH, LH, prolactin. 7. S/p flu vaccine. 8. Glucose intolerance:  New.  Repeat labs today; dietary modification recommended with weight loss and exercise. 9.  Bronchospasm: Chronic; intermittent; trigger is URI; refill of Prednisone provided.   Orders Placed This Encounter  Procedures  . Flu Vaccine QUAD 36+ mos IM  . Comprehensive metabolic panel    Order Specific Question:  Has the patient fasted?    Answer:  Yes  . Lipid panel    Order Specific Question:  Has the patient fasted?    Answer:  Yes  . HIV antibody  . Testosterone  . Follicle Stimulating Hormone  . Luteinizing hormone  . Prolactin   Meds ordered this encounter  Medications  . losartan (COZAAR) 50 MG tablet    Sig: Take 1 tablet (50 mg total) by mouth daily.    Dispense:  90 tablet    Refill:  3  . tadalafil (CIALIS) 20 MG tablet    Sig: Take 0.5-1 tablets (10-20 mg total) by mouth  every other day as needed for erectile dysfunction.    Dispense:  24 tablet    Refill:  3  . predniSONE (DELTASONE) 20 MG tablet    Sig: Three tablets daily x 2 days then two tablets daily x 5 days then one tablet daily x 5 days    Dispense:  21 tablet    Refill:  0    Return in about 6 months (around 07/14/2015) for complete physical examiniation.    Annick Dimaio Elayne Guerin, M.D. [facility] [address] [place], [place]  [address] [phone] phone [phone] fax

## 2015-01-13 NOTE — Patient Instructions (Signed)

## 2015-01-18 ENCOUNTER — Telehealth: Payer: Self-pay

## 2015-01-18 NOTE — Telephone Encounter (Signed)
PA completed for Cialis and form faxed back to [facility] w/confirmation. Pending.

## 2015-01-23 NOTE — Telephone Encounter (Signed)
PA approved through 01/20/16. Notified [facility].

## 2015-01-31 ENCOUNTER — Encounter: Payer: Self-pay | Admitting: Family Medicine

## 2015-02-10 ENCOUNTER — Telehealth: Payer: Self-pay

## 2015-02-10 DIAGNOSIS — N521 Erectile dysfunction due to diseases classified elsewhere: Secondary | ICD-10-CM

## 2015-02-10 NOTE — Telephone Encounter (Signed)
Patient never received his prescription for Cialis (tadalafil (CIALIS) 20 MG tablet) that was sent through the mail order service on 01/13/15. He received his other prescriptions, but not that one.  He would like the prescription resubmitted to the [facility] in [place] instead of the mail order.  CB#: [phone]

## 2015-02-13 MED ORDER — TADALAFIL 20 MG PO TABS: 10.0000 mg | ORAL_TABLET | ORAL | Status: DC | PRN

## 2015-02-13 NOTE — Telephone Encounter (Signed)
Rx sent to [facility] in [place] 

## 2015-05-04 ENCOUNTER — Other Ambulatory Visit: Payer: Self-pay

## 2015-05-04 MED ORDER — LOSARTAN POTASSIUM 50 MG PO TABS: 50.0000 mg | ORAL_TABLET | Freq: Every day | ORAL | Status: DC

## 2015-05-04 MED ORDER — PRAVASTATIN SODIUM 20 MG PO TABS: 20.0000 mg | ORAL_TABLET | Freq: Every day | ORAL | Status: DC

## 2015-05-04 MED ORDER — OMEPRAZOLE 20 MG PO CPDR: 20.0000 mg | DELAYED_RELEASE_CAPSULE | Freq: Every day | ORAL | Status: DC

## 2015-05-04 MED ORDER — AMLODIPINE BESYLATE 5 MG PO TABS: 5.0000 mg | ORAL_TABLET | Freq: Every day | ORAL | Status: DC

## 2015-05-04 NOTE — Telephone Encounter (Signed)
Refills approved.

## 2015-05-04 NOTE — Telephone Encounter (Signed)
[facility] sent RF reqs for losartan (I re-sent remaining RFs from Dec Rx), and also omeprazole, amlodipine and pravastatin. Dr Tamala Julian, I don't see that you have Rxd these three for pt before. You did see pt for check up in Dec. Pended for review.

## 2015-06-30 ENCOUNTER — Ambulatory Visit: Payer: BLUE CROSS/BLUE SHIELD | Admitting: Family Medicine

## 2015-08-01 ENCOUNTER — Encounter: Payer: Self-pay | Admitting: Family Medicine

## 2015-08-01 ENCOUNTER — Ambulatory Visit (INDEPENDENT_AMBULATORY_CARE_PROVIDER_SITE_OTHER): Payer: PPO | Admitting: Family Medicine

## 2015-08-01 VITALS — BP 128/80 | HR 54 | Temp 97.7°F | Resp 16 | Ht 66.5 in | Wt 170.6 lb

## 2015-08-01 DIAGNOSIS — Z125 Encounter for screening for malignant neoplasm of prostate: Secondary | ICD-10-CM

## 2015-08-01 DIAGNOSIS — K219 Gastro-esophageal reflux disease without esophagitis: Secondary | ICD-10-CM | POA: Diagnosis not present

## 2015-08-01 DIAGNOSIS — Z0000 Encounter for general adult medical examination without abnormal findings: Secondary | ICD-10-CM

## 2015-08-01 DIAGNOSIS — E291 Testicular hypofunction: Secondary | ICD-10-CM

## 2015-08-01 DIAGNOSIS — R7302 Impaired glucose tolerance (oral): Secondary | ICD-10-CM

## 2015-08-01 DIAGNOSIS — N521 Erectile dysfunction due to diseases classified elsewhere: Secondary | ICD-10-CM

## 2015-08-01 DIAGNOSIS — I10 Essential (primary) hypertension: Secondary | ICD-10-CM

## 2015-08-01 DIAGNOSIS — R6882 Decreased libido: Secondary | ICD-10-CM | POA: Diagnosis not present

## 2015-08-01 DIAGNOSIS — E785 Hyperlipidemia, unspecified: Secondary | ICD-10-CM

## 2015-08-01 LAB — CBC WITH DIFFERENTIAL/PLATELET
BASOS PCT: 2 %
Basophils Absolute: 82 cells/uL (ref 0–200)
Eosinophils Absolute: 246 cells/uL (ref 15–500)
Eosinophils Relative: 6 %
HEMATOCRIT: 46.5 % (ref 38.5–50.0)
HEMOGLOBIN: 15.6 g/dL (ref 13.2–17.1)
LYMPHS ABS: 1558 {cells}/uL (ref 850–3900)
Lymphocytes Relative: 38 %
MCH: 30.4 pg (ref 27.0–33.0)
MCHC: 33.5 g/dL (ref 32.0–36.0)
MCV: 90.6 fL (ref 80.0–100.0)
MONO ABS: 369 {cells}/uL (ref 200–950)
MPV: 11.6 fL (ref 7.5–12.5)
Monocytes Relative: 9 %
NEUTROS ABS: 1845 {cells}/uL (ref 1500–7800)
Neutrophils Relative %: 45 %
Platelets: 149 10*3/uL (ref 140–400)
RBC: 5.13 MIL/uL (ref 4.20–5.80)
RDW: 13.8 % (ref 11.0–15.0)
WBC: 4.1 10*3/uL (ref 3.8–10.8)

## 2015-08-01 LAB — POCT URINALYSIS DIP (MANUAL ENTRY)
BILIRUBIN UA: NEGATIVE
BILIRUBIN UA: NEGATIVE
Glucose, UA: NEGATIVE
Leukocytes, UA: NEGATIVE
Nitrite, UA: NEGATIVE
PH UA: 6.5
Protein Ur, POC: NEGATIVE
RBC UA: NEGATIVE
Spec Grav, UA: 1.015
Urobilinogen, UA: 0.2

## 2015-08-01 LAB — POC MICROSCOPIC URINALYSIS (UMFC): Mucus: ABSENT

## 2015-08-01 NOTE — Progress Notes (Signed)
Subjective:    Patient ID: Nathaniel Bender, male    DOB: November 29, 1950, 65 y.o.   MRN: [id number]  08/01/2015  Annual Exam   HPI This 65 y.o. male presents for Complete Physical Examination.  Last physical:  07-06-14 Colonoscopy:  04-18-14 TDAP:  2010 Pneumovax: never Zostavax:  2012; sister with horrible shingles. Influenza:  12/2014 Eye exam:  +glasses; Marica Otter; g/c/md Dental exam: every six months  HTN: Patient reports good compliance with medication, good tolerance to medication, and good symptom control.    Hyperlipidemia: Patient reports good compliance with medication, good tolerance to medication, and good symptom control.    Foot fracture: healed now.  GERD: Patient reports good compliance with medication, good tolerance to medication, and good symptom control.    Review of Systems  Constitutional: Negative for fever, chills, diaphoresis, activity change, appetite change, fatigue and unexpected weight change.  HENT: Negative for congestion, dental problem, drooling, ear discharge, ear pain, facial swelling, hearing loss, mouth sores, nosebleeds, postnasal drip, rhinorrhea, sinus pressure, sneezing, sore throat, tinnitus, trouble swallowing and voice change.   Eyes: Negative for photophobia, pain, discharge, redness, itching and visual disturbance.  Respiratory: Negative for apnea, cough, choking, chest tightness, shortness of breath, wheezing and stridor.   Cardiovascular: Negative for chest pain, palpitations and leg swelling.  Gastrointestinal: Negative for nausea, vomiting, abdominal pain, diarrhea, constipation and blood in stool.  Endocrine: Negative for cold intolerance, heat intolerance, polydipsia, polyphagia and polyuria.  Genitourinary: Negative for dysuria, urgency, frequency, hematuria, flank pain, decreased urine volume, discharge, penile swelling, scrotal swelling, enuresis, difficulty urinating, genital sores, penile pain and testicular pain.    Musculoskeletal: Negative for myalgias, back pain, joint swelling, arthralgias, gait problem, neck pain and neck stiffness.  Skin: Negative for color change, pallor, rash and wound.  Allergic/Immunologic: Negative for environmental allergies, food allergies and immunocompromised state.  Neurological: Negative for dizziness, tremors, seizures, syncope, facial asymmetry, speech difficulty, weakness, light-headedness, numbness and headaches.  Hematological: Negative for adenopathy. Does not bruise/bleed easily.  Psychiatric/Behavioral: Negative for suicidal ideas, hallucinations, behavioral problems, confusion, sleep disturbance, self-injury, dysphoric mood, decreased concentration and agitation. The patient is not nervous/anxious and is not hyperactive.     Past Medical History:  Diagnosis Date  . Arthritis    Degenerative Disc Disease Cervical; s/p injection [facility].  . Cancer ([place]) 07/21/2013   Skin cancer unknown type  . Colon polyps   . GERD (gastroesophageal reflux disease)   . Gout   . Hyperlipidemia   . Hypertension   . Meniere's disease of left ear    s/p ENT evaluation; acute hearing loss; chronic tinnitus L now.  Recovered hearing completely.   Past Surgical History:  Procedure Laterality Date  . TONSILLECTOMY     Not on File Current Outpatient Prescriptions  Medication Sig Dispense Refill  . amLODipine (NORVASC) 5 MG tablet Take 1 tablet (5 mg total) by mouth daily. 90 tablet 3  . losartan (COZAAR) 50 MG tablet Take 1 tablet (50 mg total) by mouth daily. 90 tablet 2  . omeprazole (PRILOSEC) 20 MG capsule Take 1 capsule (20 mg total) by mouth daily. 90 capsule 3  . OVER THE COUNTER MEDICATION Aleve taking 3-5 tabs a week.    . pravastatin (PRAVACHOL) 20 MG tablet Take 1 tablet (20 mg total) by mouth daily. 90 tablet 3  . sildenafil (VIAGRA) 100 MG tablet Take 1 tablet (100 mg total) by mouth daily as needed for erectile dysfunction. 8 tablet 5  . tadalafil (CIALIS)  20 MG tablet Take 0.5-1 tablets (10-20 mg total) by mouth every other day as needed for erectile dysfunction. 24 tablet 3   No current facility-administered medications for this visit.    Social History   Social History  . Marital status: Married    Spouse name: N/A  . Number of children: N/A  . Years of education: N/A   Occupational History  . Sales    Social History Main Topics  . Smoking status: Never Smoker  . Smokeless tobacco: Not on file  . Alcohol use 12.0 oz/week    10 Glasses of wine, 10 Standard drinks or equivalent per week  . Drug use: No  . Sexual activity: Yes   Other Topics Concern  . Not on file   Social History Narrative   Marital:  Married. X 26 years.        Children: none      Lives: with wife       Education: College/Other. Exercise: Yes.      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      Tobacco: never      Alcohol:  2-3 per day; more on weekends; wine.      Exercise:  Walking 4 days per week; 2-4 miles.      Seatbelt:  100%      Guns: 12 gauge shot gun.      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     Family History  Problem Relation Age of Onset  . Hyperlipidemia Mother   . Hypertension Mother   . Arthritis Mother 73    DDD lumbar spine  . Diabetes Father   . Heart disease Father 8    CAD/CABG  . Hyperlipidemia Father   . Hypertension Father   . Hyperlipidemia Sister   . Hypertension Sister   . Heart disease Maternal Grandfather   . Mental illness Paternal Grandmother   . Stroke Paternal Grandfather        Objective:    BP 128/80   Pulse (!) 54   Temp 97.7 F (36.5 C) (Oral)   Resp 16   Ht 5' 6.5" (1.689 m)   Wt 170 lb 9.6 oz (77.4 kg)   SpO2 97%   BMI 27.12 kg/m  Physical Exam  Constitutional: He is oriented to person, place, and time. He appears well-developed and well-nourished. No distress.  HENT:  Head: Normocephalic and  atraumatic.  Right Ear: External ear normal.  Left Ear: External ear normal.  Nose: Nose normal.  Mouth/Throat: Oropharynx is clear and moist.  Eyes: Conjunctivae and EOM are normal. Pupils are equal, round, and reactive to light.  Neck: Normal range of motion. Neck supple. Carotid bruit is not present. No thyromegaly present.  Cardiovascular: Normal rate, regular rhythm, normal heart sounds and intact distal pulses.  Exam reveals no gallop and no friction rub.   No murmur heard. Pulmonary/Chest: Effort normal and breath sounds normal. He has no wheezes. He has no rales.  Abdominal: Soft. Bowel sounds are normal. He exhibits no distension and no mass. There is no tenderness. There is no rebound and no guarding. Hernia confirmed negative in the right inguinal area and confirmed negative in the left inguinal area.  Genitourinary: Rectum normal, testes normal and penis normal. Rectal exam shows no mass. Prostate is enlarged. Circumcised.  Musculoskeletal:       Right shoulder:  Normal.       Left shoulder: Normal.       Cervical back: Normal.  Lymphadenopathy:    He has no cervical adenopathy.       Right: No inguinal adenopathy present.       Left: No inguinal adenopathy present.  Neurological: He is alert and oriented to person, place, and time. He has normal reflexes. No cranial nerve deficit. He exhibits normal muscle tone. Coordination normal.  Skin: Skin is warm and dry. No rash noted. He is not diaphoretic.  Psychiatric: He has a normal mood and affect. His behavior is normal. Judgment and thought content normal.        Assessment & Plan:   1. Routine physical examination   2. Glucose intolerance (impaired glucose tolerance)   3. Hypogonadism in male   4. Decreased libido   5. Erectile dysfunction due to diseases classified elsewhere   6. Gastroesophageal reflux disease without esophagitis   7. Essential hypertension, benign   8. Hyperlipidemia   9. Screening for prostate  cancer     Orders Placed This Encounter  Procedures  . CBC with Differential/Platelet  . Comprehensive metabolic panel    Order Specific Question:   Has the patient fasted?    Answer:   Yes  . Hemoglobin A1c  . Lipid panel    Order Specific Question:   Has the patient fasted?    Answer:   Yes  . PSA  . POCT urinalysis dipstick  . POCT Microscopic Urinalysis ([facility])   No orders of the defined types were placed in this encounter.   Return in about 6 months (around 02/01/2016) for recheck high blood pressure, high cholesterol.    Kalayah Leske Elayne Guerin, M.D. [facility] [address] [place], [place]  [address] [phone] phone [phone] fax

## 2015-08-01 NOTE — Patient Instructions (Addendum)
   IF you received an x-ray today, you will receive an invoice from [facility]. Please contact [facility] at [phone] with questions or concerns regarding your invoice.   IF you received labwork today, you will receive an invoice from [facility]. Please contact [facility] at [phone] with questions or concerns regarding your invoice.   Our billing staff will not be able to assist you with questions regarding bills from these companies.  You will be contacted with the lab results as soon as they are available. The fastest way to get your results is to activate your My Chart account. Instructions are located on the last page of this paperwork. If you have not heard from us regarding the results in 2 weeks, please contact this office.    Keeping you healthy  Get these tests  Blood pressure- Have your blood pressure checked once a year by your healthcare provider.  Normal blood pressure is 120/80  Weight- Have your body mass index (BMI) calculated to screen for obesity.  BMI is a measure of body fat based on height and weight. You can also calculate your own BMI at www.nhlbisuport.com/bmi/.  Cholesterol- Have your cholesterol checked every year.  Diabetes- Have your blood sugar checked regularly if you have high blood pressure, high cholesterol, have a family history of diabetes or if you are overweight.  Screening for Colon Cancer- Colonoscopy starting at age 50.  Screening may begin sooner depending on your family history and other health conditions. Follow up colonoscopy as directed by your Gastroenterologist.  Screening for Prostate Cancer- Both blood work (PSA) and a rectal exam help screen for Prostate Cancer.  Screening begins at age 40 with African-American men and at age 50 with Caucasian men.  Screening may begin sooner depending on your family history.  Take these medicines  Aspirin- One aspirin daily can help prevent Heart  disease and Stroke.  Flu shot- Every fall.  Tetanus- Every 10 years.  Zostavax- Once after the age of 60 to prevent Shingles.  Pneumonia shot- Once after the age of 65; if you are younger than 65, ask your healthcare provider if you need a Pneumonia shot.  Take these steps  Don't smoke- If you do smoke, talk to your doctor about quitting.  For tips on how to quit, go to www.smokefree.gov or call 1-800-QUIT-NOW.  Be physically active- Exercise 5 days a week for at least 30 minutes.  If you are not already physically active start slow and gradually work up to 30 minutes of moderate physical activity.  Examples of moderate activity include walking briskly, mowing the yard, dancing, swimming, bicycling, etc.  Eat a healthy diet- Eat a variety of healthy food such as fruits, vegetables, low fat milk, low fat cheese, yogurt, lean meant, poultry, fish, beans, tofu, etc. For more information go to www.thenutritionsource.org  Drink alcohol in moderation- Limit alcohol intake to less than two drinks a day. Never drink and drive.  Dentist- Brush and floss twice daily; visit your dentist twice a year.  Depression- Your emotional health is as important as your physical health. If you're feeling down, or losing interest in things you would normally enjoy please talk to your healthcare provider.  Eye exam- Visit your eye doctor every year.  Safe sex- If you may be exposed to a sexually transmitted infection, use a condom.  Seat belts- Seat belts can save your life; always wear one.  Smoke/Carbon Monoxide detectors- These detectors need to be installed on   the appropriate level of your home.  Replace batteries at least once a year.  Skin cancer- When out in the sun, cover up and use sunscreen 15 SPF or higher.  Violence- If anyone is threatening you, please tell your healthcare provider.  Living Will/ Health care power of attorney- Speak with your healthcare provider and family. 

## 2015-08-02 LAB — LIPID PANEL
CHOLESTEROL: 192 mg/dL (ref 125–200)
HDL: 37 mg/dL — ABNORMAL LOW (ref >=40)
LDL CALC: 111 mg/dL (ref <130)
TRIGLYCERIDES: 221 mg/dL — AB (ref <150)
Total CHOL/HDL Ratio: 5.2 Ratio — ABNORMAL HIGH (ref <=5.0)
VLDL: 44 mg/dL — ABNORMAL HIGH (ref <30)

## 2015-08-02 LAB — COMPREHENSIVE METABOLIC PANEL
ALBUMIN: 4.6 g/dL (ref 3.6–5.1)
ALK PHOS: 55 U/L (ref 40–115)
ALT: 20 U/L (ref 9–46)
AST: 20 U/L (ref 10–35)
BILIRUBIN TOTAL: 0.7 mg/dL (ref 0.2–1.2)
BUN: 17 mg/dL (ref 7–25)
CALCIUM: 9.3 mg/dL (ref 8.6–10.3)
CO2: 22 mmol/L (ref 20–31)
Chloride: 106 mmol/L (ref 98–110)
Creat: 1.03 mg/dL (ref 0.70–1.25)
GLUCOSE: 103 mg/dL — AB (ref 65–99)
Potassium: 4.1 mmol/L (ref 3.5–5.3)
Sodium: 140 mmol/L (ref 135–146)
Total Protein: 7.1 g/dL (ref 6.1–8.1)

## 2015-08-02 LAB — HEMOGLOBIN A1C
Hgb A1c MFr Bld: 5.7 % — ABNORMAL HIGH (ref <5.7)
Mean Plasma Glucose: 117 mg/dL

## 2015-08-02 LAB — PSA: PSA: 1.33 ng/mL (ref <=4.00)

## 2015-08-20 MED ORDER — OMEPRAZOLE 20 MG PO CPDR: 20.0000 mg | DELAYED_RELEASE_CAPSULE | Freq: Every day | ORAL | 3 refills | Status: DC

## 2015-08-20 MED ORDER — PRAVASTATIN SODIUM 20 MG PO TABS: 20.0000 mg | ORAL_TABLET | Freq: Every day | ORAL | 3 refills | Status: DC

## 2015-08-20 MED ORDER — LOSARTAN POTASSIUM 50 MG PO TABS: 50.0000 mg | ORAL_TABLET | Freq: Every day | ORAL | 3 refills | Status: DC

## 2015-08-20 MED ORDER — AMLODIPINE BESYLATE 5 MG PO TABS: 5.0000 mg | ORAL_TABLET | Freq: Every day | ORAL | 3 refills | Status: DC

## 2015-09-28 ENCOUNTER — Encounter: Payer: Self-pay | Admitting: Family Medicine

## 2016-01-02 ENCOUNTER — Other Ambulatory Visit: Payer: Self-pay | Admitting: Dermatology

## 2016-03-17 ENCOUNTER — Other Ambulatory Visit: Payer: Self-pay | Admitting: Family Medicine

## 2016-03-17 DIAGNOSIS — N521 Erectile dysfunction due to diseases classified elsewhere: Secondary | ICD-10-CM

## 2016-03-18 NOTE — Telephone Encounter (Signed)
Please contact patient to schedule follow-up visit with Dr. Tamala Julian re: HTN

## 2016-03-27 ENCOUNTER — Telehealth: Payer: Self-pay | Admitting: Family Medicine

## 2016-03-27 DIAGNOSIS — N521 Erectile dysfunction due to diseases classified elsewhere: Secondary | ICD-10-CM

## 2016-03-27 NOTE — Telephone Encounter (Signed)
Pt was refused a refill on Cialis so he has set up an apt with Dr Tamala Julian. Her earliest apt was 3/14 so he has scheduled this but would like to know if he can get a temp supply to cover between now and the visit.

## 2016-03-28 ENCOUNTER — Telehealth: Payer: Self-pay | Admitting: Family Medicine

## 2016-03-28 NOTE — Telephone Encounter (Signed)
Please advise 

## 2016-03-28 NOTE — Telephone Encounter (Signed)
Pt is needing a refill on prednizone -he states that it has been about two years since he had the medication but that he does have an appt with dr Tamala Julian next Wednesday and that him and her have an aggreement about this medication and that he did not want to go into detail  Best number [phone]

## 2016-03-29 MED ORDER — TADALAFIL 20 MG PO TABS: 10.0000 mg | ORAL_TABLET | ORAL | 0 refills | Status: DC | PRN

## 2016-03-29 MED ORDER — PREDNISONE 20 MG PO TABS: ORAL_TABLET | ORAL | 0 refills | Status: DC

## 2016-03-29 NOTE — Telephone Encounter (Signed)
Pt advised.

## 2016-03-29 NOTE — Telephone Encounter (Signed)
I approved Prednisone and sent to [facility] in [place]. Please advise pt.

## 2016-03-29 NOTE — Telephone Encounter (Signed)
Call --- Cialis rx sent to [facility].

## 2016-04-03 ENCOUNTER — Ambulatory Visit (INDEPENDENT_AMBULATORY_CARE_PROVIDER_SITE_OTHER): Payer: 59 | Admitting: Family Medicine

## 2016-04-03 ENCOUNTER — Encounter: Payer: Self-pay | Admitting: Family Medicine

## 2016-04-03 VITALS — BP 138/77 | HR 57 | Temp 98.0°F | Ht 66.5 in | Wt 174.4 lb

## 2016-04-03 DIAGNOSIS — E7800 Pure hypercholesterolemia, unspecified: Secondary | ICD-10-CM | POA: Diagnosis not present

## 2016-04-03 DIAGNOSIS — E291 Testicular hypofunction: Secondary | ICD-10-CM

## 2016-04-03 DIAGNOSIS — I10 Essential (primary) hypertension: Secondary | ICD-10-CM

## 2016-04-03 DIAGNOSIS — J9809 Other diseases of bronchus, not elsewhere classified: Secondary | ICD-10-CM | POA: Diagnosis not present

## 2016-04-03 DIAGNOSIS — Z6827 Body mass index (BMI) 27.0-27.9, adult: Secondary | ICD-10-CM

## 2016-04-03 DIAGNOSIS — N521 Erectile dysfunction due to diseases classified elsewhere: Secondary | ICD-10-CM

## 2016-04-03 DIAGNOSIS — K219 Gastro-esophageal reflux disease without esophagitis: Secondary | ICD-10-CM

## 2016-04-03 DIAGNOSIS — R7302 Impaired glucose tolerance (oral): Secondary | ICD-10-CM | POA: Diagnosis not present

## 2016-04-03 MED ORDER — LOSARTAN POTASSIUM 50 MG PO TABS: 50.0000 mg | ORAL_TABLET | Freq: Every day | ORAL | 1 refills | Status: DC

## 2016-04-03 MED ORDER — PRAVASTATIN SODIUM 20 MG PO TABS: 20.0000 mg | ORAL_TABLET | Freq: Every day | ORAL | 1 refills | Status: DC

## 2016-04-03 MED ORDER — AMLODIPINE BESYLATE 5 MG PO TABS: 5.0000 mg | ORAL_TABLET | Freq: Every day | ORAL | 1 refills | Status: DC

## 2016-04-03 MED ORDER — TADALAFIL 20 MG PO TABS: 10.0000 mg | ORAL_TABLET | ORAL | 0 refills | Status: DC | PRN

## 2016-04-03 MED ORDER — OMEPRAZOLE 20 MG PO CPDR: 20.0000 mg | DELAYED_RELEASE_CAPSULE | Freq: Every day | ORAL | 1 refills | Status: DC

## 2016-04-03 MED ORDER — PREDNISONE 20 MG PO TABS: ORAL_TABLET | ORAL | 0 refills | Status: DC

## 2016-04-03 NOTE — Patient Instructions (Addendum)
   IF you received an x-ray today, you will receive an invoice from [facility]. Please contact [facility] at [phone] with questions or concerns regarding your invoice.   IF you received labwork today, you will receive an invoice from [facility]. Please contact [facility] at [phone] with questions or concerns regarding your invoice.   Our billing staff will not be able to assist you with questions regarding bills from these companies.  You will be contacted with the lab results as soon as they are available. The fastest way to get your results is to activate your My Chart account. Instructions are located on the last page of this paperwork. If you have not heard from us regarding the results in 2 weeks, please contact this office.     DASH Eating Plan DASH stands for "Dietary Approaches to Stop Hypertension." The DASH eating plan is a healthy eating plan that has been shown to reduce high blood pressure (hypertension). It may also reduce your risk for type 2 diabetes, heart disease, and stroke. The DASH eating plan may also help with weight loss. What are tips for following this plan? General guidelines   Avoid eating more than 2,300 mg (milligrams) of salt (sodium) a day. If you have hypertension, you may need to reduce your sodium intake to 1,500 mg a day.  Limit alcohol intake to no more than 1 drink a day for nonpregnant women and 2 drinks a day for men. One drink equals 12 oz of beer, 5 oz of wine, or 1 oz of hard liquor.  Work with your health care provider to maintain a healthy body weight or to lose weight. Ask what an ideal weight is for you.  Get at least 30 minutes of exercise that causes your heart to beat faster (aerobic exercise) most days of the week. Activities may include walking, swimming, or biking.  Work with your health care provider or diet and nutrition specialist (dietitian) to adjust your eating plan to your individual calorie  needs. Reading food labels   Check food labels for the amount of sodium per serving. Choose foods with less than 5 percent of the Daily Value of sodium. Generally, foods with less than 300 mg of sodium per serving fit into this eating plan.  To find whole grains, look for the word "whole" as the first word in the ingredient list. Shopping   Buy products labeled as "low-sodium" or "no salt added."  Buy fresh foods. Avoid canned foods and premade or frozen meals. Cooking   Avoid adding salt when cooking. Use salt-free seasonings or herbs instead of table salt or sea salt. Check with your health care provider or pharmacist before using salt substitutes.  Do not fry foods. Cook foods using healthy methods such as baking, boiling, grilling, and broiling instead.  Cook with heart-healthy oils, such as olive, canola, soybean, or sunflower oil. Meal planning    Eat a balanced diet that includes:  5 or more servings of fruits and vegetables each day. At each meal, try to fill half of your plate with fruits and vegetables.  Up to 6-8 servings of whole grains each day.  Less than 6 oz of lean meat, poultry, or fish each day. A 3-oz serving of meat is about the same size as a deck of cards. One egg equals 1 oz.  2 servings of low-fat dairy each day.  A serving of nuts, seeds, or beans 5 times each week.  Heart-healthy fats. Healthy fats called   Omega-3 fatty acids are found in foods such as flaxseeds and coldwater fish, like sardines, salmon, and mackerel.  Limit how much you eat of the following:  Canned or prepackaged foods.  Food that is high in trans fat, such as fried foods.  Food that is high in saturated fat, such as fatty meat.  Sweets, desserts, sugary drinks, and other foods with added sugar.  Full-fat dairy products.  Do not salt foods before eating.  Try to eat at least 2 vegetarian meals each week.  Eat more home-cooked food and less restaurant, buffet, and fast  food.  When eating at a restaurant, ask that your food be prepared with less salt or no salt, if possible. What foods are recommended? The items listed may not be a complete list. Talk with your dietitian about what dietary choices are best for you. Grains  Whole-grain or whole-wheat bread. Whole-grain or whole-wheat pasta. Brown rice. Oatmeal. Quinoa. Bulgur. Whole-grain and low-sodium cereals. Pita bread. Low-fat, low-sodium crackers. Whole-wheat flour tortillas. Vegetables  Fresh or frozen vegetables (raw, steamed, roasted, or grilled). Low-sodium or reduced-sodium tomato and vegetable juice. Low-sodium or reduced-sodium tomato sauce and tomato paste. Low-sodium or reduced-sodium canned vegetables. Fruits  All fresh, dried, or frozen fruit. Canned fruit in natural juice (without added sugar). Meat and other protein foods  Skinless chicken or turkey. Ground chicken or turkey. Pork with fat trimmed off. Fish and seafood. Egg whites. Dried beans, peas, or lentils. Unsalted nuts, nut butters, and seeds. Unsalted canned beans. Lean cuts of beef with fat trimmed off. Low-sodium, lean deli meat. Dairy  Low-fat (1%) or fat-free (skim) milk. Fat-free, low-fat, or reduced-fat cheeses. Nonfat, low-sodium ricotta or cottage cheese. Low-fat or nonfat yogurt. Low-fat, low-sodium cheese. Fats and oils  Soft margarine without trans fats. Vegetable oil. Low-fat, reduced-fat, or light mayonnaise and salad dressings (reduced-sodium). Canola, safflower, olive, soybean, and sunflower oils. Avocado. Seasoning and other foods  Herbs. Spices. Seasoning mixes without salt. Unsalted popcorn and pretzels. Fat-free sweets. What foods are not recommended? The items listed may not be a complete list. Talk with your dietitian about what dietary choices are best for you. Grains  Baked goods made with fat, such as croissants, muffins, or some breads. Dry pasta or rice meal packs. Vegetables  Creamed or fried vegetables.  Vegetables in a cheese sauce. Regular canned vegetables (not low-sodium or reduced-sodium). Regular canned tomato sauce and paste (not low-sodium or reduced-sodium). Regular tomato and vegetable juice (not low-sodium or reduced-sodium). Pickles. Olives. Fruits  Canned fruit in a light or heavy syrup. Fried fruit. Fruit in cream or butter sauce. Meat and other protein foods  Fatty cuts of meat. Ribs. Fried meat. Bacon. Sausage. Bologna and other processed lunch meats. Salami. Fatback. Hotdogs. Bratwurst. Salted nuts and seeds. Canned beans with added salt. Canned or smoked fish. Whole eggs or egg yolks. Chicken or turkey with skin. Dairy  Whole or 2% milk, cream, and half-and-half. Whole or full-fat cream cheese. Whole-fat or sweetened yogurt. Full-fat cheese. Nondairy creamers. Whipped toppings. Processed cheese and cheese spreads. Fats and oils  Butter. Stick margarine. Lard. Shortening. Ghee. Bacon fat. Tropical oils, such as coconut, palm kernel, or palm oil. Seasoning and other foods  Salted popcorn and pretzels. Onion salt, garlic salt, seasoned salt, table salt, and sea salt. Worcestershire sauce. Tartar sauce. Barbecue sauce. Teriyaki sauce. Soy sauce, including reduced-sodium. Steak sauce. Canned and packaged gravies. Fish sauce. Oyster sauce. Cocktail sauce. Horseradish that you find on the shelf. Ketchup. Mustard. Meat flavorings   and tenderizers. Bouillon cubes. Hot sauce and Tabasco sauce. Premade or packaged marinades. Premade or packaged taco seasonings. Relishes. Regular salad dressings. Where to find more information:  National Heart, Lung, and Blood Institute: www.nhlbi.nih.gov  American Heart Association: www.heart.org Summary  The DASH eating plan is a healthy eating plan that has been shown to reduce high blood pressure (hypertension). It may also reduce your risk for type 2 diabetes, heart disease, and stroke.  With the DASH eating plan, you should limit salt (sodium) intake  to 2,300 mg a day. If you have hypertension, you may need to reduce your sodium intake to 1,500 mg a day.  When on the DASH eating plan, aim to eat more fresh fruits and vegetables, whole grains, lean proteins, low-fat dairy, and heart-healthy fats.  Work with your health care provider or diet and nutrition specialist (dietitian) to adjust your eating plan to your individual calorie needs. This information is not intended to replace advice given to you by your health care provider. Make sure you discuss any questions you have with your health care provider. Document Released: 12/27/2010 Document Revised: 01/01/2016 Document Reviewed: 01/01/2016 Elsevier Interactive Patient Education  2017 [employer]  
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
## 2016-05-18 DIAGNOSIS — J9809 Other diseases of bronchus, not elsewhere classified: Secondary | ICD-10-CM | POA: Insufficient documentation

## 2016-06-04 ENCOUNTER — Encounter: Payer: Self-pay | Admitting: Family Medicine

## 2016-06-04 ENCOUNTER — Ambulatory Visit (INDEPENDENT_AMBULATORY_CARE_PROVIDER_SITE_OTHER): Payer: 59 | Admitting: Family Medicine

## 2016-06-04 VITALS — BP 130/84 | HR 53 | Temp 97.3°F | Resp 16 | Ht 66.5 in | Wt 173.0 lb

## 2016-06-04 DIAGNOSIS — S238XXA Sprain of other specified parts of thorax, initial encounter: Secondary | ICD-10-CM | POA: Diagnosis not present

## 2016-06-04 DIAGNOSIS — I10 Essential (primary) hypertension: Secondary | ICD-10-CM | POA: Diagnosis not present

## 2016-06-04 DIAGNOSIS — R0789 Other chest pain: Secondary | ICD-10-CM | POA: Diagnosis not present

## 2016-06-04 DIAGNOSIS — E7800 Pure hypercholesterolemia, unspecified: Secondary | ICD-10-CM

## 2016-06-04 DIAGNOSIS — Z23 Encounter for immunization: Secondary | ICD-10-CM | POA: Diagnosis not present

## 2016-06-04 MED ORDER — ZOSTER VAC RECOMB ADJUVANTED 50 MCG/0.5ML IM SUSR: 0.5000 mL | Freq: Once | INTRAMUSCULAR | 1 refills | Status: AC
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
## 2016-06-04 NOTE — Patient Instructions (Addendum)
     IF you received an x-ray today, you will receive an invoice from [facility]. Please contact [facility] at [phone] with questions or concerns regarding your invoice.   IF you received labwork today, you will receive an invoice from [facility]. Please contact [facility] at [phone] with questions or concerns regarding your invoice.   Our billing staff will not be able to assist you with questions regarding bills from these companies.  You will be contacted with the lab results as soon as they are available. The fastest way to get your results is to activate your My Chart account. Instructions are located on the last page of this paperwork. If you have not heard from us regarding the results in 2 weeks, please contact this office.     Chest Wall Pain Chest wall pain is pain in or around the bones and muscles of your chest. Sometimes, an injury causes this pain. Sometimes, the cause may not be known. This pain may take several weeks or longer to get better. Follow these instructions at home: Pay attention to any changes in your symptoms. Take these actions to help with your pain:  Rest as told by your health care provider.  Avoid activities that cause pain. These include any activities that use your chest muscles or your abdominal and side muscles to lift heavy items.  If directed, apply ice to the painful area: ? Put ice in a plastic bag. ? Place a towel between your skin and the bag. ? Leave the ice on for 20 minutes, 2-3 times per day.  Take over-the-counter and prescription medicines only as told by your health care provider.  Do not use tobacco products, including cigarettes, chewing tobacco, and e-cigarettes. If you need help quitting, ask your health care provider.  Keep all follow-up visits as told by your health care provider. This is important.  Contact a health care provider if:  You have a fever.  Your chest pain becomes worse.  You have  new symptoms. Get help right away if:  You have nausea or vomiting.  You feel sweaty or light-headed.  You have a cough with phlegm (sputum) or you cough up blood.  You develop shortness of breath. This information is not intended to replace advice given to you by your health care provider. Make sure you discuss any questions you have with your health care provider. Document Released: 01/07/2005 Document Revised: 05/18/2015 Document Reviewed: 04/04/2014 Elsevier Interactive Patient Education  2017 [employer]vier Inc.  

## 2016-09-23 ENCOUNTER — Telehealth: Payer: Self-pay | Admitting: Family Medicine

## 2016-09-23 NOTE — Telephone Encounter (Signed)
Call --- patient is due for CPE and for six month follow-up of high blood pressure and high cholesterol; please schedule OV with me in upcoming month.  I refilled medications for one month only; I received refill request from pharmacy.  Needs to be FASTING FOR VISIT.

## 2016-10-07 ENCOUNTER — Other Ambulatory Visit: Payer: Self-pay | Admitting: *Deleted

## 2016-10-07 MED ORDER — LOSARTAN POTASSIUM 50 MG PO TABS: 50.0000 mg | ORAL_TABLET | Freq: Every day | ORAL | 0 refills | Status: DC

## 2016-10-07 MED ORDER — AMLODIPINE BESYLATE 5 MG PO TABS: 5.0000 mg | ORAL_TABLET | Freq: Every day | ORAL | 0 refills | Status: DC

## 2016-10-07 MED ORDER — OMEPRAZOLE 20 MG PO CPDR: 20.0000 mg | DELAYED_RELEASE_CAPSULE | Freq: Every day | ORAL | 1 refills | Status: DC

## 2016-10-07 MED ORDER — PRAVASTATIN SODIUM 20 MG PO TABS: 20.0000 mg | ORAL_TABLET | Freq: Every day | ORAL | 0 refills | Status: DC

## 2016-10-07 NOTE — Telephone Encounter (Signed)
Please advise 

## 2016-10-07 NOTE — Telephone Encounter (Signed)
Have patient KEEP 12/16/16 for CPE.  Please CANCEL his appointment on 10/16/16 and  11/25/16.  Please advise patient of these changes.  I have sent in additional refills for patient to have to get him to 12/16/16 visit.
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
## 2016-10-16 ENCOUNTER — Ambulatory Visit: Payer: 59 | Admitting: Family Medicine

## 2016-10-21 ENCOUNTER — Encounter: Payer: 59 | Admitting: Family Medicine

## 2016-11-25 ENCOUNTER — Ambulatory Visit: Payer: 59 | Admitting: Family Medicine

## 2016-12-16 ENCOUNTER — Encounter: Payer: Self-pay | Admitting: Family Medicine

## 2016-12-16 ENCOUNTER — Other Ambulatory Visit: Payer: Self-pay

## 2016-12-16 ENCOUNTER — Ambulatory Visit (INDEPENDENT_AMBULATORY_CARE_PROVIDER_SITE_OTHER): Payer: 59 | Admitting: Family Medicine

## 2016-12-16 VITALS — BP 118/58 | HR 51 | Temp 97.9°F | Wt 170.0 lb

## 2016-12-16 DIAGNOSIS — Z23 Encounter for immunization: Secondary | ICD-10-CM | POA: Diagnosis not present

## 2016-12-16 DIAGNOSIS — E7800 Pure hypercholesterolemia, unspecified: Secondary | ICD-10-CM

## 2016-12-16 DIAGNOSIS — Z125 Encounter for screening for malignant neoplasm of prostate: Secondary | ICD-10-CM | POA: Diagnosis not present

## 2016-12-16 DIAGNOSIS — Z131 Encounter for screening for diabetes mellitus: Secondary | ICD-10-CM

## 2016-12-16 DIAGNOSIS — Z0000 Encounter for general adult medical examination without abnormal findings: Secondary | ICD-10-CM

## 2016-12-16 DIAGNOSIS — N521 Erectile dysfunction due to diseases classified elsewhere: Secondary | ICD-10-CM

## 2016-12-16 DIAGNOSIS — I10 Essential (primary) hypertension: Secondary | ICD-10-CM | POA: Diagnosis not present

## 2016-12-16 DIAGNOSIS — R252 Cramp and spasm: Secondary | ICD-10-CM

## 2016-12-16 LAB — POCT URINALYSIS DIP (MANUAL ENTRY)
Bilirubin, UA: NEGATIVE
Glucose, UA: NEGATIVE mg/dL
Ketones, POC UA: NEGATIVE mg/dL
Leukocytes, UA: NEGATIVE
NITRITE UA: NEGATIVE
PH UA: 6 (ref 5.0–8.0)
PROTEIN UA: NEGATIVE mg/dL
RBC UA: NEGATIVE
SPEC GRAV UA: 1.02 (ref 1.010–1.025)
UROBILINOGEN UA: 0.2 U/dL

## 2016-12-16 MED ORDER — TADALAFIL 20 MG PO TABS: 10.0000 mg | ORAL_TABLET | ORAL | 1 refills | Status: DC | PRN

## 2016-12-16 MED ORDER — LOSARTAN POTASSIUM 50 MG PO TABS: 50.0000 mg | ORAL_TABLET | Freq: Every day | ORAL | 1 refills | Status: DC

## 2016-12-16 MED ORDER — ZOSTER VAC RECOMB ADJUVANTED 50 MCG/0.5ML IM SUSR: 0.5000 mL | Freq: Once | INTRAMUSCULAR | 1 refills | Status: AC

## 2016-12-16 MED ORDER — PRAVASTATIN SODIUM 20 MG PO TABS: 20.0000 mg | ORAL_TABLET | Freq: Every day | ORAL | 1 refills | Status: DC

## 2016-12-16 MED ORDER — OMEPRAZOLE 20 MG PO CPDR: 20.0000 mg | DELAYED_RELEASE_CAPSULE | Freq: Every day | ORAL | 3 refills | Status: DC

## 2016-12-16 MED ORDER — AMLODIPINE BESYLATE 5 MG PO TABS: 5.0000 mg | ORAL_TABLET | Freq: Every day | ORAL | 1 refills | Status: DC

## 2016-12-16 NOTE — Patient Instructions (Addendum)
   IF you received an x-ray today, you will receive an invoice from [facility]. Please contact [facility] at [phone] with questions or concerns regarding your invoice.   IF you received labwork today, you will receive an invoice from [facility]. Please contact [facility] at [phone] with questions or concerns regarding your invoice.   Our billing staff will not be able to assist you with questions regarding bills from these companies.  You will be contacted with the lab results as soon as they are available. The fastest way to get your results is to activate your My Chart account. Instructions are located on the last page of this paperwork. If you have not heard from us regarding the results in 2 weeks, please contact this office.     Preventive Care 40-64 Years, Male Preventive care refers to lifestyle choices and visits with your health care provider that can promote health and wellness. What does preventive care include?  A yearly physical exam. This is also called an annual well check.  Dental exams once or twice a year.  Routine eye exams. Ask your health care provider how often you should have your eyes checked.  Personal lifestyle choices, including: ? Daily care of your teeth and gums. ? Regular physical activity. ? Eating a healthy diet. ? Avoiding tobacco and drug use. ? Limiting alcohol use. ? Practicing safe sex. ? Taking low-dose aspirin every day starting at age 50. What happens during an annual well check? The services and screenings done by your health care provider during your annual well check will depend on your age, overall health, lifestyle risk factors, and family history of disease. Counseling Your health care provider may ask you questions about your:  Alcohol use.  Tobacco use.  Drug use.  Emotional well-being.  Home and relationship well-being.  Sexual activity.  Eating habits.  Work and work  environment.  Screening You may have the following tests or measurements:  Height, weight, and BMI.  Blood pressure.  Lipid and cholesterol levels. These may be checked every 5 years, or more frequently if you are over 50 years old.  Skin check.  Lung cancer screening. You may have this screening every year starting at age 55 if you have a 30-pack-year history of smoking and currently smoke or have quit within the past 15 years.  Fecal occult blood test (FOBT) of the stool. You may have this test every year starting at age 50.  Flexible sigmoidoscopy or colonoscopy. You may have a sigmoidoscopy every 5 years or a colonoscopy every 10 years starting at age 50.  Prostate cancer screening. Recommendations will vary depending on your family history and other risks.  Hepatitis C blood test.  Hepatitis B blood test.  Sexually transmitted disease (STD) testing.  Diabetes screening. This is done by checking your blood sugar (glucose) after you have not eaten for a while (fasting). You may have this done every 1-3 years.  Discuss your test results, treatment options, and if necessary, the need for more tests with your health care provider. Vaccines Your health care provider may recommend certain vaccines, such as:  Influenza vaccine. This is recommended every year.  Tetanus, diphtheria, and acellular pertussis (Tdap, Td) vaccine. You may need a Td booster every 10 years.  Varicella vaccine. You may need this if you have not been vaccinated.  Zoster vaccine. You may need this after age 60.  Measles, mumps, and rubella (MMR) vaccine. You may need at least one dose of   MMR if you were born in 1957 or later. You may also need a second dose.  Pneumococcal 13-valent conjugate (PCV13) vaccine. You may need this if you have certain conditions and have not been vaccinated.  Pneumococcal polysaccharide (PPSV23) vaccine. You may need one or two doses if you smoke cigarettes or if you have  certain conditions.  Meningococcal vaccine. You may need this if you have certain conditions.  Hepatitis A vaccine. You may need this if you have certain conditions or if you travel or work in places where you may be exposed to hepatitis A.  Hepatitis B vaccine. You may need this if you have certain conditions or if you travel or work in places where you may be exposed to hepatitis B.  Haemophilus influenzae type b (Hib) vaccine. You may need this if you have certain risk factors.  Talk to your health care provider about which screenings and vaccines you need and how often you need them. This information is not intended to replace advice given to you by your health care provider. Make sure you discuss any questions you have with your health care provider. Document Released: 02/03/2015 Document Revised: 09/27/2015 Document Reviewed: 11/08/2014 Elsevier Interactive Patient Education  2017 [employer].
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
## 2016-12-17 LAB — COMPREHENSIVE METABOLIC PANEL
A/G RATIO: 2 (ref 1.2–2.2)
ALT: 21 IU/L (ref 0–44)
AST: 21 IU/L (ref 0–40)
Albumin: 4.7 g/dL (ref 3.6–4.8)
Alkaline Phosphatase: 64 IU/L (ref 39–117)
BILIRUBIN TOTAL: 0.3 mg/dL (ref 0.0–1.2)
BUN/Creatinine Ratio: 23 (ref 10–24)
BUN: 24 mg/dL (ref 8–27)
CHLORIDE: 104 mmol/L (ref 96–106)
CO2: 21 mmol/L (ref 20–29)
Calcium: 9.5 mg/dL (ref 8.6–10.2)
Creatinine, Ser: 1.03 mg/dL (ref 0.76–1.27)
GFR calc Af Amer: 88 mL/min/{1.73_m2} (ref >59)
GFR calc non Af Amer: 76 mL/min/{1.73_m2} (ref >59)
GLUCOSE: 110 mg/dL — AB (ref 65–99)
Globulin, Total: 2.4 g/dL (ref 1.5–4.5)
POTASSIUM: 4.2 mmol/L (ref 3.5–5.2)
Sodium: 142 mmol/L (ref 134–144)
Total Protein: 7.1 g/dL (ref 6.0–8.5)

## 2016-12-17 LAB — CBC WITH DIFFERENTIAL/PLATELET
BASOS ABS: 0.1 10*3/uL (ref 0.0–0.2)
BASOS: 2 %
EOS (ABSOLUTE): 0.3 10*3/uL (ref 0.0–0.4)
Eos: 7 %
Hematocrit: 45.3 % (ref 37.5–51.0)
Hemoglobin: 15.5 g/dL (ref 13.0–17.7)
IMMATURE GRANULOCYTES: 0 %
Immature Grans (Abs): 0 10*3/uL (ref 0.0–0.1)
Lymphocytes Absolute: 2 10*3/uL (ref 0.7–3.1)
Lymphs: 42 %
MCH: 30.6 pg (ref 26.6–33.0)
MCHC: 34.2 g/dL (ref 31.5–35.7)
MCV: 89 fL (ref 79–97)
MONOS ABS: 0.4 10*3/uL (ref 0.1–0.9)
Monocytes: 10 %
NEUTROS ABS: 1.8 10*3/uL (ref 1.4–7.0)
NEUTROS PCT: 39 %
PLATELETS: 136 10*3/uL — AB (ref 150–379)
RBC: 5.07 x10E6/uL (ref 4.14–5.80)
RDW: 14.1 % (ref 12.3–15.4)
WBC: 4.6 10*3/uL (ref 3.4–10.8)

## 2016-12-17 LAB — HEMOGLOBIN A1C
ESTIMATED AVERAGE GLUCOSE: 123 mg/dL
HEMOGLOBIN A1C: 5.9 % — AB (ref 4.8–5.6)

## 2016-12-17 LAB — LIPID PANEL
CHOLESTEROL TOTAL: 207 mg/dL — AB (ref 100–199)
Chol/HDL Ratio: 4.8 ratio (ref 0.0–5.0)
HDL: 43 mg/dL (ref >39)
LDL Calculated: 126 mg/dL — ABNORMAL HIGH (ref 0–99)
Triglycerides: 190 mg/dL — ABNORMAL HIGH (ref 0–149)
VLDL Cholesterol Cal: 38 mg/dL (ref 5–40)

## 2016-12-17 LAB — PSA: Prostate Specific Ag, Serum: 1.3 ng/mL (ref 0.0–4.0)

## 2016-12-19 ENCOUNTER — Encounter: Payer: Self-pay | Admitting: Family Medicine

## 2017-01-01 ENCOUNTER — Other Ambulatory Visit: Payer: Self-pay | Admitting: Dermatology

## 2017-01-09 ENCOUNTER — Telehealth: Payer: Self-pay | Admitting: Family Medicine

## 2017-01-09 NOTE — Telephone Encounter (Signed)
Copied from [place] [id number]. Topic: Quick Communication - Rx Refill/Question >> Jan 09, 2017  5:01 PM Patrice Paradise wrote: Has the patient contacted their pharmacy? Yes.   omeprazole (PRILOSEC) 20 MG capsule (PATIENT IS OUT OF HIS MED, BUT WAS INFORMED IT MAY TAKE UP TO 3 BUSINESS DAY).  (Agent: If no, request that the patient contact the pharmacy for the refill.) Preferred Pharmacy (with phone number or street name):  [id number] - [place], [place] - [address] [place] [address] Phone: [phone] Fax: [phone]   Agent: Please be advised that RX refills may take up to 3 business days. We ask that you follow-up with your pharmacy.

## 2017-01-10 NOTE — Telephone Encounter (Signed)
Called patient about prescription request. / Patient is asking for prednisone or a Z-pak to [facility] [place]. / Patient states he has already discussed this with Dr. Tamala Julian, and she allowed him to make the decision if he had an issue with his throat to give her a call and she would send it in.   / Patient did not go into detail as he states Dr. Tamala Julian is very aware.

## 2017-01-16 NOTE — Telephone Encounter (Signed)
Pt is requesting Prednisone.  States this had been discussed with provider and he was to request med if he felt it was needed.  Would like it sent to [facility] [place].  Please advise.  Thank you.

## 2017-01-17 MED ORDER — PREDNISONE 20 MG PO TABS: 20.0000 mg | ORAL_TABLET | Freq: Two times a day (BID) | ORAL | 1 refills | Status: DC

## 2017-01-17 MED ORDER — OMEPRAZOLE 20 MG PO CPDR: 20.0000 mg | DELAYED_RELEASE_CAPSULE | Freq: Every day | ORAL | 2 refills | Status: DC

## 2017-01-17 NOTE — Telephone Encounter (Signed)
Call --- 1. I sent in refill of Prednisone to [facility] [place].  2. I also sent in refill of Omeprazole to [facility] [place] for 30 day supply as  I sent in 90 day supply to [facility] on 12/16/16.  Has patient received the 90 day supply from [facility]?

## 2017-01-17 NOTE — Telephone Encounter (Signed)
Just spoke with pt and he stated he is good to go. And Happy New Year to [place] all.

## 2017-04-29 ENCOUNTER — Ambulatory Visit (INDEPENDENT_AMBULATORY_CARE_PROVIDER_SITE_OTHER): Payer: Medicare Other | Admitting: Urgent Care

## 2017-04-29 ENCOUNTER — Ambulatory Visit (INDEPENDENT_AMBULATORY_CARE_PROVIDER_SITE_OTHER): Payer: Medicare Other

## 2017-04-29 ENCOUNTER — Encounter: Payer: Self-pay | Admitting: Urgent Care

## 2017-04-29 VITALS — BP 122/82 | HR 63 | Temp 97.9°F | Resp 16 | Ht 66.5 in | Wt 171.4 lb

## 2017-04-29 DIAGNOSIS — R0602 Shortness of breath: Secondary | ICD-10-CM

## 2017-04-29 DIAGNOSIS — J9801 Acute bronchospasm: Secondary | ICD-10-CM

## 2017-04-29 DIAGNOSIS — J209 Acute bronchitis, unspecified: Secondary | ICD-10-CM | POA: Diagnosis not present

## 2017-04-29 DIAGNOSIS — R059 Cough, unspecified: Secondary | ICD-10-CM

## 2017-04-29 DIAGNOSIS — R05 Cough: Secondary | ICD-10-CM

## 2017-04-29 MED ORDER — AZITHROMYCIN 250 MG PO TABS: ORAL_TABLET | ORAL | 0 refills | Status: DC

## 2017-04-29 MED ORDER — MONTELUKAST SODIUM 10 MG PO TABS: 10.0000 mg | ORAL_TABLET | Freq: Every day | ORAL | 3 refills | Status: DC

## 2017-04-29 MED ORDER — ALBUTEROL SULFATE HFA 108 (90 BASE) MCG/ACT IN AERS: 2.0000 | INHALATION_SPRAY | Freq: Four times a day (QID) | RESPIRATORY_TRACT | 1 refills | Status: DC | PRN

## 2017-04-29 MED ORDER — CETIRIZINE HCL 10 MG PO TABS: 10.0000 mg | ORAL_TABLET | Freq: Every day | ORAL | 11 refills | Status: DC

## 2017-04-29 MED ORDER — HYDROCODONE-HOMATROPINE 5-1.5 MG/5ML PO SYRP: 5.0000 mL | ORAL_SOLUTION | Freq: Every evening | ORAL | 0 refills | Status: DC | PRN

## 2017-04-29 NOTE — Patient Instructions (Addendum)
Acute Bronchitis, Adult Acute bronchitis is sudden (acute) swelling of the air tubes (bronchi) in the lungs. Acute bronchitis causes these tubes to fill with mucus, which can make it hard to breathe. It can also cause coughing or wheezing. In adults, acute bronchitis usually goes away within 2 weeks. A cough caused by bronchitis may last up to 3 weeks. Smoking, allergies, and asthma can make the condition worse. Repeated episodes of bronchitis may cause further lung problems, such as chronic obstructive pulmonary disease (COPD). What are the causes? This condition can be caused by germs and by substances that irritate the lungs, including:  Cold and flu viruses. This condition is most often caused by the same virus that causes a cold.  Bacteria.  Exposure to tobacco smoke, dust, fumes, and air pollution.  What increases the risk? This condition is more likely to develop in people who:  Have close contact with someone with acute bronchitis.  Are exposed to lung irritants, such as tobacco smoke, dust, fumes, and vapors.  Have a weak immune system.  Have a respiratory condition such as asthma.  What are the signs or symptoms? Symptoms of this condition include:  A cough.  Coughing up clear, yellow, or green mucus.  Wheezing.  Chest congestion.  Shortness of breath.  A fever.  Body aches.  Chills.  A sore throat.  How is this diagnosed? This condition is usually diagnosed with a physical exam. During the exam, your health care provider may order tests, such as chest X-rays, to rule out other conditions. He or she may also:  Test a sample of your mucus for bacterial infection.  Check the level of oxygen in your blood. This is done to check for pneumonia.  Do a chest X-ray or lung function testing to rule out pneumonia and other conditions.  Perform blood tests.  Your health care provider will also ask about your symptoms and medical history. How is this  treated? Most cases of acute bronchitis clear up over time without treatment. Your health care provider may recommend:  Drinking more fluids. Drinking more makes your mucus thinner, which may make it easier to breathe.  Taking a medicine for a fever or cough.  Taking an antibiotic medicine.  Using an inhaler to help improve shortness of breath and to control a cough.  Using a cool mist vaporizer or humidifier to make it easier to breathe.  Follow these instructions at home: Medicines  Take over-the-counter and prescription medicines only as told by your health care provider.  If you were prescribed an antibiotic, take it as told by your health care provider. Do not stop taking the antibiotic even if you start to feel better. General instructions  Get plenty of rest.  Drink enough fluids to keep your urine clear or pale yellow.  Avoid smoking and secondhand smoke. Exposure to cigarette smoke or irritating chemicals will make bronchitis worse. If you smoke and you need help quitting, ask your health care provider. Quitting smoking will help your lungs heal faster.  Use an inhaler, cool mist vaporizer, or humidifier as told by your health care provider.  Keep all follow-up visits as told by your health care provider. This is important. How is this prevented? To lower your risk of getting this condition again:  Wash your hands often with soap and water. If soap and water are not available, use hand sanitizer.  Avoid contact with people who have cold symptoms.  Try not to touch your hands to your   mouth, nose, or eyes.  Make sure to get the flu shot every year.  Contact a health care provider if:  Your symptoms do not improve in 2 weeks of treatment. Get help right away if:  You cough up blood.  You have chest pain.  You have severe shortness of breath.  You become dehydrated.  You faint or keep feeling like you are going to faint.  You keep vomiting.  You have a  severe headache.  Your fever or chills gets worse. This information is not intended to replace advice given to you by your health care provider. Make sure you discuss any questions you have with your health care provider. Document Released: 02/15/2004 Document Revised: 08/02/2015 Document Reviewed: 06/28/2015 Elsevier Interactive Patient Education  2018 [employer].      IF you received an x-ray today, you will receive an invoice from [facility]. Please contact [facility] at [phone] with questions or concerns regarding your invoice.   IF you received labwork today, you will receive an invoice from [facility]. Please contact [facility] at [phone] with questions or concerns regarding your invoice.   Our billing staff will not be able to assist you with questions regarding bills from these companies.  You will be contacted with the lab results as soon as they are available. The fastest way to get your results is to activate your My Chart account. Instructions are located on the last page of this paperwork. If you have not heard from [place] regarding the results in 2 weeks, please contact this office.

## 2017-04-29 NOTE — Progress Notes (Signed)
    MRN: [id number] DOB: [birth year]  Subjective:   Nathaniel Bender is a 67 y.o. male presenting for 3 day history of recurrent dry cough that has elicited sore throat, shob. Patient has a history of bronchitis during spring season. Has been provided a script by his PCP, Dr. Tamala Julian, for prednisone for this. Has used this well in the past but has not helped this time. Denies fever, sinus pain, sinus congestion, chest pain, wheezing, n/v, abdominal pain. Denies smoking cigarettes. He does not take an antihistamine during the spring.  Jamaal has a current medication list which includes the following prescription(s): amlodipine, aspirin ec, losartan, omeprazole, OVER THE COUNTER MEDICATION, pravastatin, prednisone, sildenafil, and tadalafil. Also has No Known Allergies.  Chett  has a past medical history of Arthritis, Cancer ([place]) (07/21/2013), Colon polyps, GERD (gastroesophageal reflux disease), Gout, Hyperlipidemia, Hypertension, and Meniere's disease of left ear. Also  has a past surgical history that includes Tonsillectomy.  Objective:   Vitals: BP 122/82   Pulse 63   Temp 97.9 F (36.6 C) (Oral)   Resp 16   Ht 5' 6.5" (1.689 m)   Wt 171 lb 6.4 oz (77.7 kg)   SpO2 95%   BMI 27.25 kg/m   Physical Exam  Constitutional: He is oriented to person, place, and time. He appears well-developed and well-nourished.  HENT:  Mouth/Throat: Oropharynx is clear and moist.  Eyes: No scleral icterus.  Neck: Normal range of motion. Neck supple.  Cardiovascular: Normal rate, regular rhythm and intact distal pulses. Exam reveals no gallop and no friction rub.  No murmur heard. Pulmonary/Chest: No respiratory distress. He has no wheezes. He has no rales.  Lymphadenopathy:    He has no cervical adenopathy.  Neurological: He is alert and oriented to person, place, and time.  Skin: Skin is warm and dry.  Psychiatric: He has a normal mood and affect.    Dg Chest 2 View  Result Date: 04/29/2017 CLINICAL  DATA:  Cough, shortness of breath and throat pain for 3 days, on prednisone. EXAM: CHEST - 2 VIEW COMPARISON:  None. FINDINGS: Cardiomediastinal silhouette is normal. No pleural effusions or focal consolidations. Mild bronchitic changes. Trachea projects midline and there is no pneumothorax. Soft tissue planes and included osseous structures are non-suspicious. Mild degenerative change of the thoracic spine. IMPRESSION: Mild bronchitic changes without focal consolidation. Electronically Signed   By: Elon Alas M.D.   On: 04/29/2017 16:44    Assessment and Plan :   Acute bronchitis, unspecified organism  Cough - Plan: DG Chest 2 View  Shortness of breath - Plan: DG Chest 2 View  Bronchospasm - Plan: DG Chest 2 View  Will start albuterol inhaler, Singulair, Zyrtec. Offered cough syrup for cough suppression. Patient will fill script for azithromycin if no improvement in 3-4 days. Return-to-clinic precautions discussed, patient verbalized understanding.   Jaynee Eagles, PA-C Primary Care at [facility] [phone] 04/29/2017  4:24 PM

## 2017-06-02 ENCOUNTER — Other Ambulatory Visit: Payer: Self-pay

## 2017-06-02 ENCOUNTER — Encounter: Payer: Self-pay | Admitting: Family Medicine

## 2017-06-02 ENCOUNTER — Ambulatory Visit (INDEPENDENT_AMBULATORY_CARE_PROVIDER_SITE_OTHER): Payer: Medicare Other | Admitting: Family Medicine

## 2017-06-02 VITALS — BP 130/70 | HR 65 | Temp 98.0°F | Resp 16 | Ht 67.72 in | Wt 170.0 lb

## 2017-06-02 DIAGNOSIS — Z23 Encounter for immunization: Secondary | ICD-10-CM | POA: Diagnosis not present

## 2017-06-02 DIAGNOSIS — R7302 Impaired glucose tolerance (oral): Secondary | ICD-10-CM | POA: Diagnosis not present

## 2017-06-02 DIAGNOSIS — E7800 Pure hypercholesterolemia, unspecified: Secondary | ICD-10-CM

## 2017-06-02 DIAGNOSIS — E291 Testicular hypofunction: Secondary | ICD-10-CM | POA: Diagnosis not present

## 2017-06-02 DIAGNOSIS — I10 Essential (primary) hypertension: Secondary | ICD-10-CM | POA: Diagnosis not present

## 2017-06-02 DIAGNOSIS — K219 Gastro-esophageal reflux disease without esophagitis: Secondary | ICD-10-CM

## 2017-06-02 DIAGNOSIS — N521 Erectile dysfunction due to diseases classified elsewhere: Secondary | ICD-10-CM

## 2017-06-02 LAB — GLUCOSE, POCT (MANUAL RESULT ENTRY): POC GLUCOSE: 93 mg/dL (ref 70–99)

## 2017-06-02 MED ORDER — AMLODIPINE BESYLATE 5 MG PO TABS: 5.0000 mg | ORAL_TABLET | Freq: Every day | ORAL | 1 refills | Status: DC

## 2017-06-02 MED ORDER — PRAVASTATIN SODIUM 20 MG PO TABS: 20.0000 mg | ORAL_TABLET | Freq: Every day | ORAL | 1 refills | Status: DC

## 2017-06-02 MED ORDER — LOSARTAN POTASSIUM 50 MG PO TABS: 50.0000 mg | ORAL_TABLET | Freq: Every day | ORAL | 1 refills | Status: DC

## 2017-06-02 MED ORDER — ZOSTER VAC RECOMB ADJUVANTED 50 MCG/0.5ML IM SUSR: 0.5000 mL | Freq: Once | INTRAMUSCULAR | 1 refills | Status: AC

## 2017-06-02 MED ORDER — CETIRIZINE HCL 10 MG PO TABS: 10.0000 mg | ORAL_TABLET | Freq: Every day | ORAL | 3 refills | Status: DC

## 2017-06-02 MED ORDER — PREDNISONE 20 MG PO TABS: 20.0000 mg | ORAL_TABLET | Freq: Two times a day (BID) | ORAL | 1 refills | Status: DC

## 2017-06-02 MED ORDER — OMEPRAZOLE 20 MG PO CPDR: 20.0000 mg | DELAYED_RELEASE_CAPSULE | Freq: Every day | ORAL | 3 refills | Status: DC

## 2017-06-02 MED ORDER — FLUTICASONE PROPIONATE 50 MCG/ACT NA SUSP: 2.0000 | Freq: Every day | NASAL | 3 refills | Status: DC

## 2017-06-02 NOTE — Patient Instructions (Addendum)
   IF you received an x-ray today, you will receive an invoice from [facility]. Please contact [facility] at [phone] with questions or concerns regarding your invoice.   IF you received labwork today, you will receive an invoice from [facility]. Please contact [facility] at [phone] with questions or concerns regarding your invoice.   Our billing staff will not be able to assist you with questions regarding bills from these companies.  You will be contacted with the lab results as soon as they are available. The fastest way to get your results is to activate your My Chart account. Instructions are located on the last page of this paperwork. If you have not heard from us regarding the results in 2 weeks, please contact this office.      Managing Your Hypertension Hypertension is commonly called high blood pressure. This is when the force of your blood pressing against the walls of your arteries is too strong. Arteries are blood vessels that carry blood from your heart throughout your body. Hypertension forces the heart to work harder to pump blood, and may cause the arteries to become narrow or stiff. Having untreated or uncontrolled hypertension can cause heart attack, stroke, kidney disease, and other problems. What are blood pressure readings? A blood pressure reading consists of a higher number over a lower number. Ideally, your blood pressure should be below 120/80. The first ("top") number is called the systolic pressure. It is a measure of the pressure in your arteries as your heart beats. The second ("bottom") number is called the diastolic pressure. It is a measure of the pressure in your arteries as the heart relaxes. What does my blood pressure reading mean? Blood pressure is classified into four stages. Based on your blood pressure reading, your health care provider may use the following stages to determine what type of treatment you need, if any. Systolic  pressure and diastolic pressure are measured in a unit called mm Hg. Normal  Systolic pressure: below 120.  Diastolic pressure: below 80. Elevated  Systolic pressure: 120-129.  Diastolic pressure: below 80. Hypertension stage 1  Systolic pressure: 130-139.  Diastolic pressure: 80-89. Hypertension stage 2  Systolic pressure: 140 or above.  Diastolic pressure: 90 or above. What health risks are associated with hypertension? Managing your hypertension is an important responsibility. Uncontrolled hypertension can lead to:  A heart attack.  A stroke.  A weakened blood vessel (aneurysm).  Heart failure.  Kidney damage.  Eye damage.  Metabolic syndrome.  Memory and concentration problems.  What changes can I make to manage my hypertension? Hypertension can be managed by making lifestyle changes and possibly by taking medicines. Your health care provider will help you make a plan to bring your blood pressure within a normal range. Eating and drinking  Eat a diet that is high in fiber and potassium, and low in salt (sodium), added sugar, and fat. An example eating plan is called the DASH (Dietary Approaches to Stop Hypertension) diet. To eat this way: ? Eat plenty of fresh fruits and vegetables. Try to fill half of your plate at each meal with fruits and vegetables. ? Eat whole grains, such as whole wheat pasta, brown rice, or whole grain bread. Fill about one quarter of your plate with whole grains. ? Eat low-fat diary products. ? Avoid fatty cuts of meat, processed or cured meats, and poultry with skin. Fill about one quarter of your plate with lean proteins such as fish, chicken without skin, beans, eggs,   and tofu. ? Avoid premade and processed foods. These tend to be higher in sodium, added sugar, and fat.  Reduce your daily sodium intake. Most people with hypertension should eat less than 1,500 mg of sodium a day.  Limit alcohol intake to no more than 1 drink a day  for nonpregnant women and 2 drinks a day for men. One drink equals 12 oz of beer, 5 oz of wine, or 1 oz of hard liquor. Lifestyle  Work with your health care provider to maintain a healthy body weight, or to lose weight. Ask what an ideal weight is for you.  Get at least 30 minutes of exercise that causes your heart to beat faster (aerobic exercise) most days of the week. Activities may include walking, swimming, or biking.  Include exercise to strengthen your muscles (resistance exercise), such as weight lifting, as part of your weekly exercise routine. Try to do these types of exercises for 30 minutes at least 3 days a week.  Do not use any products that contain nicotine or tobacco, such as cigarettes and e-cigarettes. If you need help quitting, ask your health care provider.  Control any long-term (chronic) conditions you have, such as high cholesterol or diabetes. Monitoring  Monitor your blood pressure at home as told by your health care provider. Your personal target blood pressure may vary depending on your medical conditions, your age, and other factors.  Have your blood pressure checked regularly, as often as told by your health care provider. Working with your health care provider  Review all the medicines you take with your health care provider because there may be side effects or interactions.  Talk with your health care provider about your diet, exercise habits, and other lifestyle factors that may be contributing to hypertension.  Visit your health care provider regularly. Your health care provider can help you create and adjust your plan for managing hypertension. Will I need medicine to control my blood pressure? Your health care provider may prescribe medicine if lifestyle changes are not enough to get your blood pressure under control, and if:  Your systolic blood pressure is 130 or higher.  Your diastolic blood pressure is 80 or higher.  Take medicines only as told  by your health care provider. Follow the directions carefully. Blood pressure medicines must be taken as prescribed. The medicine does not work as well when you skip doses. Skipping doses also puts you at risk for problems. Contact a health care provider if:  You think you are having a reaction to medicines you have taken.  You have repeated (recurrent) headaches.  You feel dizzy.  You have swelling in your ankles.  You have trouble with your vision. Get help right away if:  You develop a severe headache or confusion.  You have unusual weakness or numbness, or you feel faint.  You have severe pain in your chest or abdomen.  You vomit repeatedly.  You have trouble breathing. Summary  Hypertension is when the force of blood pumping through your arteries is too strong. If this condition is not controlled, it may put you at risk for serious complications.  Your personal target blood pressure may vary depending on your medical conditions, your age, and other factors. For most people, a normal blood pressure is less than 120/80.  Hypertension is managed by lifestyle changes, medicines, or both. Lifestyle changes include weight loss, eating a healthy, low-sodium diet, exercising more, and limiting alcohol. This information is not intended to replace advice   given to you by your health care provider. Make sure you discuss any questions you have with your health care provider. Document Released: 10/02/2011 Document Revised: 12/06/2015 Document Reviewed: 12/06/2015 Elsevier Interactive Patient Education  2018 Elsevier Inc.  

## 2017-06-02 NOTE — Progress Notes (Signed)
Subjective:    Patient ID: Nathaniel Bender, male    DOB: 03-12-1950, 67 y.o.   MRN: [id number]  06/02/2017  Chronic Conditions (6 month follow-up )    HPI This 67 y.o. male presents for six month follow-up for hypertension, hypercholesterolemia, glucose intolerance, ED.  No changes to management made at last visit.  No concerns this morning.  Patient reports good compliance with medication, good tolerance to medication, and good symptom control.   Not checking blood pressure regularly at home.  Continues to walk daily.  BP Readings from Last 3 Encounters:  06/02/17 130/70  04/29/17 122/82  12/16/16 (!) 118/58   Wt Readings from Last 3 Encounters:  06/02/17 170 lb (77.1 kg)  04/29/17 171 lb 6.4 oz (77.7 kg)  12/16/16 170 lb (77.1 kg)   Immunization History  Administered Date(s) Administered  . Influenza Split 11/10/2011, 10/21/2012, 01/20/2016  . Influenza,inj,Quad PF,6+ Mos 12/29/2013, 01/13/2015, 12/16/2016  . Pneumococcal Conjugate-13 06/02/2017  . Td 01/22/2008  . Zoster 01/21/2010    Review of Systems  Constitutional: Negative for activity change, appetite change, chills, diaphoresis, fatigue, fever and unexpected weight change.  HENT: Negative for congestion, dental problem, drooling, ear discharge, ear pain, facial swelling, hearing loss, mouth sores, nosebleeds, postnasal drip, rhinorrhea, sinus pressure, sneezing, sore throat, tinnitus, trouble swallowing and voice change.   Eyes: Negative for photophobia, pain, discharge, redness, itching and visual disturbance.  Respiratory: Negative for apnea, cough, choking, chest tightness, shortness of breath, wheezing and stridor.   Cardiovascular: Negative for chest pain, palpitations and leg swelling.  Gastrointestinal: Negative for abdominal pain, blood in stool, constipation, diarrhea, nausea and vomiting.  Endocrine: Negative for cold intolerance, heat intolerance, polydipsia, polyphagia and polyuria.  Genitourinary:  Negative for decreased urine volume, difficulty urinating, discharge, dysuria, enuresis, flank pain, frequency, genital sores, hematuria, penile pain, penile swelling, scrotal swelling, testicular pain and urgency.  Musculoskeletal: Negative for arthralgias, back pain, gait problem, joint swelling, myalgias, neck pain and neck stiffness.  Skin: Negative for color change, pallor, rash and wound.  Allergic/Immunologic: Negative for environmental allergies, food allergies and immunocompromised state.  Neurological: Negative for dizziness, tremors, seizures, syncope, facial asymmetry, speech difficulty, weakness, light-headedness, numbness and headaches.  Hematological: Negative for adenopathy. Does not bruise/bleed easily.  Psychiatric/Behavioral: Negative for agitation, behavioral problems, confusion, decreased concentration, dysphoric mood, hallucinations, self-injury, sleep disturbance and suicidal ideas. The patient is not nervous/anxious and is not hyperactive.     Past Medical History:  Diagnosis Date  . Arthritis    Degenerative Disc Disease Cervical; s/p injection [facility].  . Cancer ([place]) 07/21/2013   Skin cancer unknown type  . Colon polyps   . GERD (gastroesophageal reflux disease)   . Gout   . Hyperlipidemia   . Hypertension   . Meniere's disease of left ear    s/p ENT evaluation; acute hearing loss; chronic tinnitus L now.  Recovered hearing completely.   Past Surgical History:  Procedure Laterality Date  . TONSILLECTOMY     No Known Allergies Current Outpatient Medications on File Prior to Visit  Medication Sig Dispense Refill  . albuterol (PROVENTIL HFA;VENTOLIN HFA) 108 (90 Base) MCG/ACT inhaler Inhale 2 puffs into the lungs every 6 (six) hours as needed. 1 Inhaler 1  . aspirin EC 81 MG tablet Take 81 mg by mouth daily.    Marland Kitchen azithromycin (ZITHROMAX) 250 MG tablet Start with 2 tablets today, then 1 daily thereafter. 6 tablet 0  . OVER THE COUNTER MEDICATION Aleve  taking 3-5 tabs  a week.    . tadalafil (CIALIS) 20 MG tablet Take 0.5-1 tablets (10-20 mg total) by mouth every other day as needed for erectile dysfunction. 24 tablet 1   No current facility-administered medications on file prior to visit.    Social History   Socioeconomic History  . Marital status: Married    Spouse name: Not on file  . Number of children: Not on file  . Years of education: Not on file  . Highest education level: Not on file  Occupational History  . Occupation: Press photographer  Social Needs  . Financial resource strain: Not on file  . Food insecurity:    Worry: Not on file    Inability: Not on file  . Transportation needs:    Medical: Not on file    Non-medical: Not on file  Tobacco Use  . Smoking status: Never Smoker  . Smokeless tobacco: Former Network engineer and Sexual Activity  . Alcohol use: Yes    Alcohol/week: 12.0 oz    Types: 10 Glasses of wine, 10 Standard drinks or equivalent per week  . Drug use: No  . Sexual activity: Yes  Lifestyle  . Physical activity:    Days per week: Not on file    Minutes per session: Not on file  . Stress: Not on file  Relationships  . Social connections:    Talks on phone: Not on file    Gets together: Not on file    Attends religious service: Not on file    Active member of club or organization: Not on file    Attends meetings of clubs or organizations: Not on file    Relationship status: Not on file  . Intimate partner violence:    Fear of current or ex partner: Not on file    Emotionally abused: Not on file    Physically abused: Not on file    Forced sexual activity: Not on file  Other Topics Concern  . Not on file  Social History Narrative   Marital:  Married. X 26 years.        Children: none      Lives: with wife       Education: College/Other. Exercise: Yes.      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      Tobacco: never       Alcohol:  2-3 per day; more on weekends; wine.      Exercise:  Walking 2 days per week; 2-4 miles.      Seatbelt:  100%      Guns: 12 gauge shot gun.      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     Family History  Problem Relation Age of Onset  . Hyperlipidemia Mother   . Hypertension Mother   . Arthritis Mother 48       DDD lumbar spine  . Diabetes Father   . Heart disease Father 56       CAD/CABG  . Hyperlipidemia Father   . Hypertension Father   . Hyperlipidemia Sister   . Hypertension Sister   . Heart disease Maternal Grandfather   . Mental illness Paternal Grandmother   . Stroke Paternal Grandfather        Objective:    BP 130/70   Pulse 65   Temp 98 F (36.7 C) (Oral)  Resp 16   Ht 5' 7.72" (1.72 m)   Wt 170 lb (77.1 kg)   SpO2 98%   BMI 26.06 kg/m  Physical Exam  Constitutional: He is oriented to person, place, and time. He appears well-developed and well-nourished. No distress.  HENT:  Head: Normocephalic and atraumatic.  Right Ear: External ear normal.  Left Ear: External ear normal.  Nose: Nose normal.  Mouth/Throat: Oropharynx is clear and moist.  Eyes: Pupils are equal, round, and reactive to light. Conjunctivae and EOM are normal.  Neck: Normal range of motion. Neck supple. Carotid bruit is not present. No thyromegaly present.  Cardiovascular: Normal rate, regular rhythm, normal heart sounds and intact distal pulses. Exam reveals no gallop and no friction rub.  No murmur heard. Pulmonary/Chest: Effort normal and breath sounds normal. He has no wheezes. He has no rales.  Abdominal: Soft. Bowel sounds are normal. He exhibits no distension and no mass. There is no tenderness. There is no rebound and no guarding.  Musculoskeletal:       Right shoulder: Normal.       Left shoulder: Normal.       Cervical back: Normal.  Lymphadenopathy:    He has no cervical adenopathy.  Neurological: He is alert and oriented to  person, place, and time. He has normal reflexes. No cranial nerve deficit. He exhibits normal muscle tone. Coordination normal.  Skin: Skin is warm and dry. No rash noted. He is not diaphoretic.  Psychiatric: He has a normal mood and affect. His behavior is normal. Judgment and thought content normal.  Nursing note and vitals reviewed.  No results found. Depression screen [facility] 2/9 06/02/2017 04/29/2017 12/16/2016 06/04/2016 04/03/2016  Decreased Interest 0 0 0 0 0  Down, Depressed, Hopeless 0 0 0 0 0  PHQ - 2 Score 0 0 0 0 0   Fall Risk  06/02/2017 04/29/2017 12/16/2016 06/04/2016 04/03/2016  Falls in the past year? No No No No No  Insert right knee      Assessment & Plan:   1. Essential hypertension, benign   2. Gastroesophageal reflux disease without esophagitis   3. Glucose intolerance (impaired glucose tolerance)   4. Hypogonadism in male   5. Erectile dysfunction due to diseases classified elsewhere   6. Pure hypercholesterolemia   7. Need for prophylactic vaccination against Streptococcus pneumoniae (pneumococcus)   8. Need for shingles vaccine     Controlled chronic medical conditions.  Obtain labs for chronic disease management.  Refills provided.  Orders Placed This Encounter  Procedures  . Pneumococcal conjugate vaccine 13-valent IM  . CBC with Differential/Platelet  . Comprehensive metabolic panel    Order Specific Question:   Has the patient fasted?    Answer:   No  . Lipid panel    Order Specific Question:   Has the patient fasted?    Answer:   No  . Hemoglobin A1c  . POCT glucose (manual entry)   Meds ordered this encounter  Medications  . Zoster Vaccine Adjuvanted [facility]) injection    Sig: Inject 0.5 mLs into the muscle once for 1 dose.    Dispense:  0.5 mL    Refill:  1  . amLODipine (NORVASC) 5 MG tablet    Sig: Take 1 tablet (5 mg total) by mouth daily.    Dispense:  90 tablet    Refill:  1  . cetirizine (ZYRTEC) 10 MG tablet    Sig: Take 1 tablet (10  mg total) by mouth daily.  Dispense:  90 tablet    Refill:  3  . losartan (COZAAR) 50 MG tablet    Sig: Take 1 tablet (50 mg total) by mouth daily.    Dispense:  90 tablet    Refill:  1  . omeprazole (PRILOSEC) 20 MG capsule    Sig: Take 1 capsule (20 mg total) by mouth daily.    Dispense:  90 capsule    Refill:  3  . pravastatin (PRAVACHOL) 20 MG tablet    Sig: Take 1 tablet (20 mg total) by mouth daily.    Dispense:  90 tablet    Refill:  1  . predniSONE (DELTASONE) 20 MG tablet    Sig: Take 1 tablet (20 mg total) by mouth 2 (two) times daily with a meal.    Dispense:  30 tablet    Refill:  1  . fluticasone (FLONASE) 50 MCG/ACT nasal spray    Sig: Place 2 sprays into both nostrils daily.    Dispense:  48 g    Refill:  3    Return in about 6 months (around 12/03/2017) for complete physical examiniation Nathaniel Bender.   Sebastyan Snodgrass Elayne Guerin, M.D. Primary Care at [facility] previously [facility] [address] [place], [place]  [address] [phone] phone [phone] fax

## 2017-06-03 LAB — CBC WITH DIFFERENTIAL/PLATELET
BASOS: 1 %
Basophils Absolute: 0 10*3/uL (ref 0.0–0.2)
EOS (ABSOLUTE): 0.2 10*3/uL (ref 0.0–0.4)
EOS: 4 %
HEMATOCRIT: 45.2 % (ref 37.5–51.0)
Hemoglobin: 15.1 g/dL (ref 13.0–17.7)
IMMATURE GRANS (ABS): 0 10*3/uL (ref 0.0–0.1)
IMMATURE GRANULOCYTES: 0 %
LYMPHS: 43 %
Lymphocytes Absolute: 2.5 10*3/uL (ref 0.7–3.1)
MCH: 29.7 pg (ref 26.6–33.0)
MCHC: 33.4 g/dL (ref 31.5–35.7)
MCV: 89 fL (ref 79–97)
MONOCYTES: 9 %
Monocytes Absolute: 0.5 10*3/uL (ref 0.1–0.9)
NEUTROS PCT: 43 %
Neutrophils Absolute: 2.5 10*3/uL (ref 1.4–7.0)
Platelets: 156 10*3/uL (ref 150–379)
RBC: 5.08 x10E6/uL (ref 4.14–5.80)
RDW: 13.6 % (ref 12.3–15.4)
WBC: 5.7 10*3/uL (ref 3.4–10.8)

## 2017-06-03 LAB — COMPREHENSIVE METABOLIC PANEL
A/G RATIO: 2 (ref 1.2–2.2)
ALT: 22 IU/L (ref 0–44)
AST: 23 IU/L (ref 0–40)
Albumin: 4.9 g/dL — ABNORMAL HIGH (ref 3.6–4.8)
Alkaline Phosphatase: 62 IU/L (ref 39–117)
BUN/Creatinine Ratio: 15 (ref 10–24)
BUN: 17 mg/dL (ref 8–27)
Bilirubin Total: 0.6 mg/dL (ref 0.0–1.2)
CALCIUM: 9.5 mg/dL (ref 8.6–10.2)
CO2: 22 mmol/L (ref 20–29)
CREATININE: 1.17 mg/dL (ref 0.76–1.27)
Chloride: 101 mmol/L (ref 96–106)
GFR, EST AFRICAN AMERICAN: 75 mL/min/{1.73_m2} (ref >59)
GFR, EST NON AFRICAN AMERICAN: 65 mL/min/{1.73_m2} (ref >59)
GLOBULIN, TOTAL: 2.4 g/dL (ref 1.5–4.5)
Glucose: 106 mg/dL — ABNORMAL HIGH (ref 65–99)
POTASSIUM: 4.3 mmol/L (ref 3.5–5.2)
SODIUM: 141 mmol/L (ref 134–144)
TOTAL PROTEIN: 7.3 g/dL (ref 6.0–8.5)

## 2017-06-03 LAB — LIPID PANEL
CHOL/HDL RATIO: 5.2 ratio — AB (ref 0.0–5.0)
Cholesterol, Total: 201 mg/dL — ABNORMAL HIGH (ref 100–199)
HDL: 39 mg/dL — AB (ref >39)
LDL CALC: 116 mg/dL — AB (ref 0–99)
Triglycerides: 228 mg/dL — ABNORMAL HIGH (ref 0–149)
VLDL Cholesterol Cal: 46 mg/dL — ABNORMAL HIGH (ref 5–40)

## 2017-06-03 LAB — HEMOGLOBIN A1C
Est. average glucose Bld gHb Est-mCnc: 123 mg/dL
Hgb A1c MFr Bld: 5.9 % — ABNORMAL HIGH (ref 4.8–5.6)

## 2017-06-17 ENCOUNTER — Encounter: Payer: Self-pay | Admitting: Family Medicine

## 2017-07-17 ENCOUNTER — Telehealth: Payer: Self-pay | Admitting: Family Medicine

## 2017-07-17 NOTE — Telephone Encounter (Signed)
Copied from [place] [id number]. Topic: Quick Communication - Rx Refill/Question >> Jul 17, 2017  1:50 PM Margot Ables wrote: Medication: sildenafil (VIAGRA) 100 MG tablet - pt has not gotten refill since 2016 - mail order usually requires 3 month supply Has the patient contacted their pharmacy? No - expired Preferred Pharmacy (with phone number or street name): [place], [place] [employer] [phone] (Phone) [phone] (Fax)

## 2017-07-18 MED ORDER — SILDENAFIL CITRATE 100 MG PO TABS: 100.0000 mg | ORAL_TABLET | Freq: Every day | ORAL | 1 refills | Status: DC | PRN

## 2017-07-18 NOTE — Telephone Encounter (Signed)
Pt medication request sent to Dr. Tamala Julian Viagra

## 2017-07-31 DIAGNOSIS — N132 Hydronephrosis with renal and ureteral calculous obstruction: Secondary | ICD-10-CM | POA: Diagnosis not present

## 2017-07-31 DIAGNOSIS — N400 Enlarged prostate without lower urinary tract symptoms: Secondary | ICD-10-CM | POA: Diagnosis not present

## 2017-07-31 DIAGNOSIS — R112 Nausea with vomiting, unspecified: Secondary | ICD-10-CM | POA: Diagnosis not present

## 2017-07-31 DIAGNOSIS — N200 Calculus of kidney: Secondary | ICD-10-CM | POA: Diagnosis not present

## 2017-07-31 DIAGNOSIS — R1032 Left lower quadrant pain: Secondary | ICD-10-CM | POA: Diagnosis not present

## 2017-07-31 DIAGNOSIS — M545 Low back pain: Secondary | ICD-10-CM | POA: Diagnosis not present

## 2017-07-31 DIAGNOSIS — N23 Unspecified renal colic: Secondary | ICD-10-CM | POA: Diagnosis not present

## 2017-08-18 DIAGNOSIS — M9902 Segmental and somatic dysfunction of thoracic region: Secondary | ICD-10-CM | POA: Diagnosis not present

## 2017-08-18 DIAGNOSIS — M9903 Segmental and somatic dysfunction of lumbar region: Secondary | ICD-10-CM | POA: Diagnosis not present

## 2017-08-18 DIAGNOSIS — M62830 Muscle spasm of back: Secondary | ICD-10-CM | POA: Diagnosis not present

## 2017-08-18 DIAGNOSIS — M9901 Segmental and somatic dysfunction of cervical region: Secondary | ICD-10-CM | POA: Diagnosis not present

## 2017-08-23 ENCOUNTER — Other Ambulatory Visit: Payer: Self-pay | Admitting: Family Medicine

## 2017-08-23 ENCOUNTER — Encounter: Payer: Self-pay | Admitting: Family Medicine

## 2017-08-23 MED ORDER — PRAVASTATIN SODIUM 40 MG PO TABS: 40.0000 mg | ORAL_TABLET | Freq: Every day | ORAL | 3 refills | Status: DC

## 2017-09-01 ENCOUNTER — Other Ambulatory Visit: Payer: Self-pay | Admitting: Family Medicine

## 2017-09-01 NOTE — Telephone Encounter (Signed)
Pt returned call, he would like to only have a 30 day supply sent to his local pharmacy.    Pharmacy:  [id number] - [place], [place] - [address] [phone] (Phone) [phone] (Fax)

## 2017-09-01 NOTE — Telephone Encounter (Signed)
Left message on voicemail to confirm that the pt wanted to use local pharmacy and not mail order pharmacy. Amlodipine refill last sent to [facility] on 06/02/17 # 90 with 1 refill.

## 2017-09-05 NOTE — Telephone Encounter (Signed)
Pt requesting refill of Amlodipine.  Advised that refill should be available at [facility].  Pt states it autoships and should be there by end of month but he is out of med.  Call to [facility].  Pt's insurance requires that he call to have refill sent- it will not autoship.  Will send refill as rush- should be there within three business days.  Call to pt and informed.  Pt agreeable and does not request interim Rx sent to [facility] at this time.

## 2017-09-10 ENCOUNTER — Other Ambulatory Visit: Payer: Self-pay | Admitting: Physician Assistant

## 2017-10-24 ENCOUNTER — Other Ambulatory Visit: Payer: Self-pay | Admitting: Family Medicine

## 2017-12-04 ENCOUNTER — Encounter: Payer: Medicare Other | Admitting: Family Medicine

## 2017-12-10 ENCOUNTER — Other Ambulatory Visit: Payer: Self-pay | Admitting: Family Medicine

## 2017-12-10 NOTE — Telephone Encounter (Signed)
Copied from [place] [id number]. Topic: Quick Communication - Rx Refill/Question >> Dec 10, 2017 10:12 AM Burchel, Abbi R wrote: Medication: amLODipine (NORVASC) 5 MG tablet, losartan (COZAAR) 50 MG tablet  Pt had a CPE scheduled, but was asked to reschedule by provider and now needs refills to get to scheduled appt. Pt is completely out of his medications. Please advise.   Has the patient contacted their pharmacy? Yes.   (Agent: If no, request that the patient contact the pharmacy for the refill.) (Agent: If yes, when and what did the pharmacy advise?)  Preferred Pharmacy: [place], [facility] [address] [place] [address] [place] [address] Phone: [phone] Fax: [phone]    Pt was advised that RX refills may take up to 3 business days. We ask that you follow-up with your pharmacy.

## 2017-12-11 MED ORDER — AMLODIPINE BESYLATE 5 MG PO TABS: 5.0000 mg | ORAL_TABLET | Freq: Every day | ORAL | 0 refills | Status: DC

## 2017-12-11 MED ORDER — LOSARTAN POTASSIUM 50 MG PO TABS: 50.0000 mg | ORAL_TABLET | Freq: Every day | ORAL | 0 refills | Status: DC

## 2017-12-11 NOTE — Telephone Encounter (Signed)
Courtesy refill Appt with Dr. Pamella Pert 01/01/18

## 2018-01-01 ENCOUNTER — Other Ambulatory Visit: Payer: Self-pay

## 2018-01-01 ENCOUNTER — Encounter: Payer: Self-pay | Admitting: Family Medicine

## 2018-01-01 ENCOUNTER — Ambulatory Visit (INDEPENDENT_AMBULATORY_CARE_PROVIDER_SITE_OTHER): Payer: Medicare Other | Admitting: Family Medicine

## 2018-01-01 VITALS — BP 112/74 | HR 49 | Temp 98.6°F | Ht 67.0 in | Wt 168.4 lb

## 2018-01-01 DIAGNOSIS — I10 Essential (primary) hypertension: Secondary | ICD-10-CM

## 2018-01-01 DIAGNOSIS — Z0000 Encounter for general adult medical examination without abnormal findings: Secondary | ICD-10-CM

## 2018-01-01 DIAGNOSIS — N521 Erectile dysfunction due to diseases classified elsewhere: Secondary | ICD-10-CM

## 2018-01-01 DIAGNOSIS — Z23 Encounter for immunization: Secondary | ICD-10-CM | POA: Diagnosis not present

## 2018-01-01 MED ORDER — TADALAFIL 20 MG PO TABS: 10.0000 mg | ORAL_TABLET | ORAL | 5 refills | Status: DC | PRN

## 2018-01-01 NOTE — Patient Instructions (Addendum)
   If you have lab work done today you will be contacted with your lab results within the next 2 weeks.  If you have not heard from us then please contact us. The fastest way to get your results is to register for My Chart.   IF you received an x-ray today, you will receive an invoice from [facility]. Please contact [facility] at [phone] with questions or concerns regarding your invoice.   IF you received labwork today, you will receive an invoice from [facility]. Please contact [facility] at [phone] with questions or concerns regarding your invoice.   Our billing staff will not be able to assist you with questions regarding bills from these companies.  You will be contacted with the lab results as soon as they are available. The fastest way to get your results is to activate your My Chart account. Instructions are located on the last page of this paperwork. If you have not heard from us regarding the results in 2 weeks, please contact this office.     Preventive Care 65 Years and Older, Male Preventive care refers to lifestyle choices and visits with your health care provider that can promote health and wellness. What does preventive care include?  A yearly physical exam. This is also called an annual well check.  Dental exams once or twice a year.  Routine eye exams. Ask your health care provider how often you should have your eyes checked.  Personal lifestyle choices, including: ? Daily care of your teeth and gums. ? Regular physical activity. ? Eating a healthy diet. ? Avoiding tobacco and drug use. ? Limiting alcohol use. ? Practicing safe sex. ? Taking low doses of aspirin every day. ? Taking vitamin and mineral supplements as recommended by your health care provider. What happens during an annual well check? The services and screenings done by your health care provider during your annual well check will depend on your age, overall health,  lifestyle risk factors, and family history of disease. Counseling Your health care provider may ask you questions about your:  Alcohol use.  Tobacco use.  Drug use.  Emotional well-being.  Home and relationship well-being.  Sexual activity.  Eating habits.  History of falls.  Memory and ability to understand (cognition).  Work and work environment.  Screening You may have the following tests or measurements:  Height, weight, and BMI.  Blood pressure.  Lipid and cholesterol levels. These may be checked every 5 years, or more frequently if you are over 50 years old.  Skin check.  Lung cancer screening. You may have this screening every year starting at age 55 if you have a 30-pack-year history of smoking and currently smoke or have quit within the past 15 years.  Fecal occult blood test (FOBT) of the stool. You may have this test every year starting at age 50.  Flexible sigmoidoscopy or colonoscopy. You may have a sigmoidoscopy every 5 years or a colonoscopy every 10 years starting at age 50.  Prostate cancer screening. Recommendations will vary depending on your family history and other risks.  Hepatitis C blood test.  Hepatitis B blood test.  Sexually transmitted disease (STD) testing.  Diabetes screening. This is done by checking your blood sugar (glucose) after you have not eaten for a while (fasting). You may have this done every 1-3 years.  Abdominal aortic aneurysm (AAA) screening. You may need this if you are a current or former smoker.  Osteoporosis. You may be screened   starting at age 87 if you are at high risk.  Talk with your health care provider about your test results, treatment options, and if necessary, the need for more tests. Vaccines Your health care provider may recommend certain vaccines, such as:  Influenza vaccine. This is recommended every year.  Tetanus, diphtheria, and acellular pertussis (Tdap, Td) vaccine. You may need a Td  booster every 10 years.  Varicella vaccine. You may need this if you have not been vaccinated.  Zoster vaccine. You may need this after age 29.  Measles, mumps, and rubella (MMR) vaccine. You may need at least one dose of MMR if you were born in 1957 or later. You may also need a second dose.  Pneumococcal 13-valent conjugate (PCV13) vaccine. One dose is recommended after age 6.  Pneumococcal polysaccharide (PPSV23) vaccine. One dose is recommended after age 51.  Meningococcal vaccine. You may need this if you have certain conditions.  Hepatitis A vaccine. You may need this if you have certain conditions or if you travel or work in places where you may be exposed to hepatitis A.  Hepatitis B vaccine. You may need this if you have certain conditions or if you travel or work in places where you may be exposed to hepatitis B.  Haemophilus influenzae type b (Hib) vaccine. You may need this if you have certain risk factors.  Talk to your health care provider about which screenings and vaccines you need and how often you need them. This information is not intended to replace advice given to you by your health care provider. Make sure you discuss any questions you have with your health care provider. Document Released: 02/03/2015 Document Revised: 09/27/2015 Document Reviewed: 11/08/2014 Elsevier Interactive Patient Education  [employer].

## 2018-01-01 NOTE — Progress Notes (Signed)
Presents today for Nathaniel Bender-Initial.   Date of last exam: nov 2018  Interpreter used for this Bender? no  Patient Care Team: Patient, No Pcp Per as PCP - General (General Practice)     Other items to address today: none   Cancer Screening: Cervical: n/a Breast: n/a Colon: yes , colonoscopy in 2016 Prostate: yes   Other Screening: Last screening for diabetes: may 2019 Last lipid screening: may 2019  Lab Results  Component Value Date   LDLCALC 116 (H) 06/02/2017    ADVANCE DIRECTIVES: Discussed: no    Immunization status:  Immunization History  Administered Date(s) Administered  . Influenza Split 11/10/2011, 10/21/2012, 01/20/2016  . Influenza,inj,Quad PF,6+ Mos 12/29/2013, 01/13/2015, 12/16/2016  . Pneumococcal Conjugate-13 06/02/2017  . Td 01/22/2008  . Zoster 01/21/2010     Health Maintenance Due  Topic Date Due  . INFLUENZA VACCINE  08/21/2017     Functional Status Survey: Is the patient deaf or have difficulty hearing?: Yes(has tinitis) Does the patient have difficulty seeing, even when wearing glasses/contacts?: Yes(night vision ) Does the patient have difficulty concentrating, remembering, or making decisions?: No Does the patient have difficulty walking or climbing stairs?: No Does the patient have difficulty dressing or bathing?: No Does the patient have difficulty doing errands alone such as visiting a doctor's office or shopping?: No      Office Bender from 01/01/2018 in [facility] at [facility]  AUDIT-C Score  4      6CIT Screen 01/01/2018  What Year? 0 points  What month? 0 points  What time? 0 points  Count back from 20 0 points  Months in reverse 0 points  Repeat phrase 0 points  Total Score 0     Patient Active Problem List   Diagnosis Date Noted  . Recurrent bronchospasm 05/18/2016  . Glucose intolerance (impaired glucose tolerance) 01/13/2015  . Hypogonadism in male 01/13/2015  .  Gastroesophageal reflux disease without esophagitis 03/29/2014  . Erectile dysfunction due to diseases classified elsewhere 03/29/2014  . Essential hypertension, benign 01/19/2014  . Hyperlipidemia 01/19/2014  . Sudden idiopathic hearing loss 11/20/2011     Past Medical History:  Diagnosis Date  . Arthritis    Degenerative Disc Disease Cervical; s/p injection [facility].  . Cancer ([place]) 07/21/2013   Skin cancer unknown type  . Colon polyps   . GERD (gastroesophageal reflux disease)   . Gout   . Hyperlipidemia   . Hypertension   . Meniere's disease of left ear    s/p ENT evaluation; acute hearing loss; chronic tinnitus L now.  Recovered hearing completely.     Past Surgical History:  Procedure Laterality Date  . TONSILLECTOMY       Family History  Problem Relation Age of Onset  . Hyperlipidemia Mother   . Hypertension Mother   . Arthritis Mother 17       DDD lumbar spine  . Diabetes Father   . Heart disease Father 10       CAD/CABG  . Hyperlipidemia Father   . Hypertension Father   . Hyperlipidemia Sister   . Hypertension Sister   . Heart disease Maternal Grandfather   . Mental illness Paternal Grandmother   . Stroke Paternal Grandfather      Social History   Socioeconomic History  . Marital status: Married    Spouse name: Not on file  . Number of children: Not on file  . Years of education: Not on file  .  Highest education level: Not on file  Occupational History  . Occupation: Press photographer  Social Needs  . Financial resource strain: Not on file  . Food insecurity:    Worry: Not on file    Inability: Not on file  . Transportation needs:    Medical: Not on file    Non-medical: Not on file  Tobacco Use  . Smoking status: Never Smoker  . Smokeless tobacco: Former Network engineer and Sexual Activity  . Alcohol use: Yes    Alcohol/week: 20.0 standard drinks    Types: 10 Glasses of wine, 10 Standard drinks or equivalent per week  . Drug use: No  .  Sexual activity: Yes  Lifestyle  . Physical activity:    Days per week: Not on file    Minutes per session: Not on file  . Stress: Not on file  Relationships  . Social connections:    Talks on phone: Not on file    Gets together: Not on file    Attends religious service: Not on file    Active member of club or organization: Not on file    Attends meetings of clubs or organizations: Not on file    Relationship status: Not on file  . Intimate partner violence:    Fear of current or ex partner: Not on file    Emotionally abused: Not on file    Physically abused: Not on file    Forced sexual activity: Not on file  Other Topics Concern  . Not on file  Social History Narrative   Marital:  Married. X 26 years.        Children: none      Lives: with wife       Education: College/Other. Exercise: Yes.      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      Tobacco: never      Alcohol:  2-3 per day; more on weekends; wine.      Exercise:  Walking 2 days per week; 2-4 miles.      Seatbelt:  100%      Guns: 12 gauge shot gun.      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       No Known Allergies   Prior to Admission medications   Medication Sig Start Date End Date Taking? Authorizing Provider  amLODipine (NORVASC) 5 MG tablet Take 1 tablet (5 mg total) by mouth daily. 12/11/17  Yes Rutherford Guys, MD  aspirin EC 81 MG tablet Take 81 mg by mouth daily.   Yes [provider]  fluticasone (FLONASE) 50 MCG/ACT nasal spray Place 2 sprays into both nostrils daily. 06/02/17  Yes Wardell Honour, MD  losartan (COZAAR) 50 MG tablet Take 1 tablet (50 mg total) by mouth daily. 12/11/17  Yes Rutherford Guys, MD  omeprazole (PRILOSEC) 20 MG capsule Take 1 capsule (20 mg total) by mouth daily. 06/02/17  Yes Wardell Honour, MD  OVER THE COUNTER MEDICATION Aleve taking 3-5 tabs a  week.   Yes [provider]  pravastatin (PRAVACHOL) 40 MG tablet Take 1 tablet (40 mg total) by mouth daily. 08/23/17  Yes Wardell Honour, MD  tadalafil (CIALIS) 20 MG tablet Take 0.5-1 tablets (10-20 mg total) by mouth every other day as needed for erectile dysfunction. 12/16/16  Yes Wardell Honour, MD  Depression screen [facility] 2/9 01/01/2018 06/02/2017 04/29/2017 12/16/2016 06/04/2016  Decreased Interest 0 0 0 0 0  Down, Depressed, Hopeless 0 0 0 0 0  PHQ - 2 Score 0 0 0 0 0     Fall Risk  01/01/2018 06/02/2017 04/29/2017 12/16/2016 06/04/2016  Falls in the past year? 0 No No No No   Review of Systems  Constitutional: Negative for chills and fever.  Respiratory: Negative for cough and shortness of breath.   Cardiovascular: Negative for chest pain, palpitations and leg swelling.  Gastrointestinal: Negative for abdominal pain, nausea and vomiting.  All other systems reviewed and are negative.   PHYSICAL EXAM: BP 112/74 (BP Location: Left Arm, Patient Position: Sitting, Cuff Size: Normal)   Pulse (!) 49   Temp 98.6 F (37 C) (Oral)   Ht 5' 7" (1.702 m)   Wt 168 lb 6.4 oz (76.4 kg)   SpO2 97%   BMI 26.38 kg/m   Wt Readings from Last 3 Encounters:  01/01/18 168 lb 6.4 oz (76.4 kg)  06/02/17 170 lb (77.1 kg)  04/29/17 171 lb 6.4 oz (77.7 kg)     Visual Acuity Screening   Right eye Left eye Both eyes  Without correction: 20/20 20/20 20/20  With correction:       Physical Exam  Constitutional: He is oriented to person, place, and time and well-developed, well-nourished, and in no distress.  HENT:  Head: Normocephalic and atraumatic.  Right Ear: Hearing, tympanic membrane, external ear and ear canal normal.  Left Ear: Hearing, tympanic membrane, external ear and ear canal normal.  Mouth/Throat: Oropharynx is clear and moist. No oropharyngeal exudate.  Eyes: Pupils are equal, round, and reactive to light. Conjunctivae and EOM are normal.  Neck: Neck supple. No  thyromegaly present.  Cardiovascular: Normal rate, regular rhythm, normal heart sounds and intact distal pulses. Exam reveals no gallop and no friction rub.  No murmur heard. Pulmonary/Chest: Effort normal and breath sounds normal. He has no wheezes. He has no rales.  Abdominal: Soft. Bowel sounds are normal. He exhibits no distension and no mass. There is no abdominal tenderness.  Musculoskeletal: Normal range of motion.        General: No edema.  Lymphadenopathy:    He has no cervical adenopathy.  Neurological: He is alert and oriented to person, place, and time. He has normal reflexes. No cranial nerve deficit. Gait normal.  Skin: Skin is warm and dry.  Psychiatric: Mood and affect normal.    Education/Counseling provided regarding diet and exercise, prevention of chronic diseases, smoking/tobacco cessation if appropriate, and reviewed "Covered Medicare Preventive Services"  ASSESSMENT/PLAN:  1. Welcome to Medicare preventive Bender No concerns per history or exam. Routine HCM labs ordered. HCM reviewed/discussed. Anticipatory guidance regarding healthy weight, lifestyle and choices given.   2. Essential hypertension, benign - Lipid panel - TSH - CMP14+EGFR  3. Erectile dysfunction due to diseases classified elsewhere - tadalafil (ADCIRCA/CIALIS) 20 MG tablet; Take 0.5-1 tablets (10-20 mg total) by mouth every other day as needed for erectile dysfunction.  Other orders - Flu vaccine HIGH DOSE PF (Fluzone High dose)  Return in about 6 months (around 07/03/2018).

## 2018-01-02 LAB — CMP14+EGFR
ALT: 19 IU/L (ref 0–44)
AST: 19 IU/L (ref 0–40)
Albumin/Globulin Ratio: 1.8 (ref 1.2–2.2)
Albumin: 4.7 g/dL (ref 3.6–4.8)
Alkaline Phosphatase: 60 IU/L (ref 39–117)
BUN/Creatinine Ratio: 17 (ref 10–24)
BUN: 18 mg/dL (ref 8–27)
Bilirubin Total: 0.6 mg/dL (ref 0.0–1.2)
CO2: 23 mmol/L (ref 20–29)
Calcium: 9.5 mg/dL (ref 8.6–10.2)
Chloride: 104 mmol/L (ref 96–106)
Creatinine, Ser: 1.03 mg/dL (ref 0.76–1.27)
GFR calc Af Amer: 88 mL/min/{1.73_m2} (ref >59)
GFR calc non Af Amer: 76 mL/min/{1.73_m2} (ref >59)
Globulin, Total: 2.6 g/dL (ref 1.5–4.5)
Glucose: 96 mg/dL (ref 65–99)
Potassium: 4.3 mmol/L (ref 3.5–5.2)
Sodium: 146 mmol/L — ABNORMAL HIGH (ref 134–144)
Total Protein: 7.3 g/dL (ref 6.0–8.5)

## 2018-01-02 LAB — LIPID PANEL
Chol/HDL Ratio: 4.6 ratio (ref 0.0–5.0)
Cholesterol, Total: 205 mg/dL — ABNORMAL HIGH (ref 100–199)
HDL: 45 mg/dL (ref >39)
LDL Calculated: 120 mg/dL — ABNORMAL HIGH (ref 0–99)
Triglycerides: 200 mg/dL — ABNORMAL HIGH (ref 0–149)
VLDL Cholesterol Cal: 40 mg/dL (ref 5–40)

## 2018-01-02 LAB — TSH: TSH: 2.07 u[IU]/mL (ref 0.450–4.500)

## 2018-01-11 MED ORDER — ATORVASTATIN CALCIUM 40 MG PO TABS: 40.0000 mg | ORAL_TABLET | Freq: Every day | ORAL | 3 refills | Status: DC

## 2018-01-11 NOTE — Addendum Note (Signed)
Addended by: Rutherford Guys on: 01/11/2018 10:06 PM   Modules accepted: Orders

## 2018-01-25 ENCOUNTER — Ambulatory Visit (HOSPITAL_COMMUNITY)
Admission: EM | Admit: 2018-01-25 | Discharge: 2018-01-25 | Disposition: A | Discharge status: Home / Self Care | Payer: Medicare Other | Attending: Emergency Medicine | Admitting: Emergency Medicine

## 2018-01-25 ENCOUNTER — Ambulatory Visit (INDEPENDENT_AMBULATORY_CARE_PROVIDER_SITE_OTHER): Payer: Medicare Other

## 2018-01-25 ENCOUNTER — Encounter (HOSPITAL_COMMUNITY): Payer: Self-pay | Admitting: Emergency Medicine

## 2018-01-25 DIAGNOSIS — B9789 Other viral agents as the cause of diseases classified elsewhere: Secondary | ICD-10-CM | POA: Diagnosis not present

## 2018-01-25 DIAGNOSIS — R05 Cough: Secondary | ICD-10-CM | POA: Diagnosis not present

## 2018-01-25 DIAGNOSIS — J069 Acute upper respiratory infection, unspecified: Secondary | ICD-10-CM | POA: Insufficient documentation

## 2018-01-25 DIAGNOSIS — I771 Stricture of artery: Secondary | ICD-10-CM | POA: Insufficient documentation

## 2018-01-25 MED ORDER — BENZONATATE 100 MG PO CAPS: 100.0000 mg | ORAL_CAPSULE | Freq: Three times a day (TID) | ORAL | 0 refills | Status: DC

## 2018-01-25 NOTE — ED Triage Notes (Signed)
Pt states hes had a cough x2 weeks that hasn't really gotten better, saw his PCP last week and had a course of steriods.

## 2018-01-25 NOTE — ED Provider Notes (Signed)
[facility]   [id number] 01/25/18 Arrival Time: 77  Cc: COUGH  SUBJECTIVE:  Nathaniel Bender is a 68 y.o. male who presents with cough x 2 weeks.  Admits positive sick exposure to wife with similar symptoms.  Describes cough as intermittent and productive with green sputum.  Has tried left over prednsione with temporary relief.  Symptoms are made worse with at night.   Reports previous symptoms in the past. Complains of fatigue, mild rhinorrhea, mild nasal congestion, sore throat, and PND.  Denies fever, chills, sinus pain, SOB, wheezing, chest pain, nausea, changes in bowel or bladder habits.    Denies hx of asthma or tobacco use  ROS: As per HPI.  Past Medical History:  Diagnosis Date  . Arthritis    Degenerative Disc Disease Cervical; s/p injection [facility].  . Cancer ([place]) 07/21/2013   Skin cancer unknown type  . Colon polyps   . GERD (gastroesophageal reflux disease)   . Gout   . Hyperlipidemia   . Hypertension   . Meniere's disease of left ear    s/p ENT evaluation; acute hearing loss; chronic tinnitus L now.  Recovered hearing completely.   Past Surgical History:  Procedure Laterality Date  . TONSILLECTOMY     No Known Allergies No current facility-administered medications on file prior to encounter.    Current Outpatient Medications on File Prior to Encounter  Medication Sig Dispense Refill  . amLODipine (NORVASC) 5 MG tablet Take 1 tablet (5 mg total) by mouth daily. 90 tablet 0  . aspirin EC 81 MG tablet Take 81 mg by mouth daily.    Marland Kitchen atorvastatin (LIPITOR) 40 MG tablet Take 1 tablet (40 mg total) by mouth daily. 90 tablet 3  . fluticasone (FLONASE) 50 MCG/ACT nasal spray Place 2 sprays into both nostrils daily. 48 g 3  . losartan (COZAAR) 50 MG tablet Take 1 tablet (50 mg total) by mouth daily. 90 tablet 0  . omeprazole (PRILOSEC) 20 MG capsule Take 1 capsule (20 mg total) by mouth daily. 90 capsule 3  . OVER THE COUNTER MEDICATION Aleve taking  3-5 tabs a week.    . tadalafil (ADCIRCA/CIALIS) 20 MG tablet Take 0.5-1 tablets (10-20 mg total) by mouth every other day as needed for erectile dysfunction. 24 tablet 5    Social History   Socioeconomic History  . Marital status: Married    Spouse name: Not on file  . Number of children: Not on file  . Years of education: Not on file  . Highest education level: Not on file  Occupational History  . Occupation: Press photographer  Social Needs  . Financial resource strain: Not on file  . Food insecurity:    Worry: Not on file    Inability: Not on file  . Transportation needs:    Medical: Not on file    Non-medical: Not on file  Tobacco Use  . Smoking status: Never Smoker  . Smokeless tobacco: Former Network engineer and Sexual Activity  . Alcohol use: Yes    Alcohol/week: 20.0 standard drinks    Types: 10 Glasses of wine, 10 Standard drinks or equivalent per week  . Drug use: No  . Sexual activity: Yes  Lifestyle  . Physical activity:    Days per week: Not on file    Minutes per session: Not on file  . Stress: Not on file  Relationships  . Social connections:    Talks on phone: Not on file    Gets together:  Not on file    Attends religious service: Not on file    Active member of club or organization: Not on file    Attends meetings of clubs or organizations: Not on file    Relationship status: Not on file  . Intimate partner violence:    Fear of current or ex partner: Not on file    Emotionally abused: Not on file    Physically abused: Not on file    Forced sexual activity: Not on file  Other Topics Concern  . Not on file  Social History Narrative   Marital:  Married. X 26 years.        Children: none      Lives: with wife       Education: College/Other. Exercise: Yes.      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      Tobacco: never      Alcohol:  2-3 per day; more on weekends; wine.       Exercise:  Walking 2 days per week; 2-4 miles.      Seatbelt:  100%      Guns: 12 gauge shot gun.      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     Family History  Problem Relation Age of Onset  . Hyperlipidemia Mother   . Hypertension Mother   . Arthritis Mother 33       DDD lumbar spine  . Diabetes Father   . Heart disease Father 80       CAD/CABG  . Hyperlipidemia Father   . Hypertension Father   . Hyperlipidemia Sister   . Hypertension Sister   . Heart disease Maternal Grandfather   . Mental illness Paternal Grandmother   . Stroke Paternal Grandfather      OBJECTIVE:  Vitals:   01/25/18 1213 01/25/18 1215  BP:  (!) 147/85  Pulse: (!) 50   Resp: 18   Temp: 98.1 F (36.7 C)      General appearance: Alert, appears mildly fatigued, but nontoxic; speaking in full sentences without difficulty HEENT:NCAT; Ears: EACs clear, TMs pearly gray; Eyes: PERRL.  EOM grossly intact. Nose: nares patent without rhinorrhea; Throat: tonsils nonerythematous or enlarged, uvula midline  Neck: supple without LAD Lungs: clear to auscultation bilaterally without adventitious breath sounds; normal respiratory effort; mild cough present Heart: regular rate and rhythm.  Radial pulses 2+ symmetrical bilaterally Skin: warm and dry Psychological: alert and cooperative; normal mood and affect  DIAGNOSTIC STUDIES:  Dg Chest 2 View  Result Date: 01/25/2018 CLINICAL DATA:  Per pt: sick for two weeks, cough, head AND chest congestion, no fever, sputum is dark green. History of Bronchitis. No history of cardiac disease. Non smoker. HBP controlled with medication. Patient is not a diabetic EXAM: CHEST - 2 VIEW COMPARISON:  04/29/2017 FINDINGS: Tortuous thoracic aorta. Heart size within normal limits. Thoracic spondylosis. The lungs appear clear. No blunting of the costophrenic angles. IMPRESSION: 1. No active cardiopulmonary disease is radiographically apparent. 2. Tortuous  thoracic aorta. 3. Thoracic spondylosis. Electronically Signed   By: Van Clines M.D.   On: 01/25/2018 13:37     ASSESSMENT & PLAN:  1. Viral URI with cough   2. Tortuous aorta (HCC)     Meds ordered this encounter  Medications  . benzonatate (TESSALON) 100 MG capsule    Sig: Take 1  capsule (100 mg total) by mouth every 8 (eight) hours.    Dispense:  21 capsule    Refill:  0    Order Specific Question:   Supervising Provider    Answer:   Raylene Everts [[id number]]    Orders Placed This Encounter  Procedures  . DG Chest 2 View    Standing Status:   Standing    Number of Occurrences:   1    Order Specific Question:   Reason for Exam (SYMPTOM  OR DIAGNOSIS REQUIRED)    Answer:   productive cough x 2 weeks    Chest x-ray did not show pneumonia or bronchitis Symptoms most likely viral Get plenty of rest and push fluids Prescribed tessolone perles as needed for cough You may use OTC zyrtec and/or flonase as needed for congestion and runny nose  Follow up with PCP for recheck and/or if symptoms persists Return or go to ER if you have any new or worsening symptoms such as fever, chills, fatigue, shortness of breath, wheezing, chest pain, nausea, changes in bowel or bladder habits, etc...  Radiologist also mentions "tortuous aorta" on chest x-ray.  This was not mentioned in your last x-ray.  It may be a normal variant.  Please follow up with PCP for further evaluation and management  Reviewed expectations re: course of current medical issues. Questions answered. Outlined signs and symptoms indicating need for more acute intervention. Patient verbalized understanding. After Visit Summary given.          Lestine Box, PA-C 01/25/18 1525

## 2018-01-25 NOTE — Discharge Instructions (Signed)
Chest x-ray did not show pneumonia or bronchitis Symptoms most likely viral Get plenty of rest and push fluids Prescribed tessolone perles as needed for cough You may use OTC zyrtec and/or flonase as needed for congestion and runny nose  Follow up with PCP for recheck and/or if symptoms persists Return or go to ER if you have any new or worsening symptoms such as fever, chills, fatigue, shortness of breath, wheezing, chest pain, nausea, changes in bowel or bladder habits, etc...  Radiologist also mentions "tortuous aorta" on chest x-ray.  This was not mentioned in your last x-ray.  It may be a normal variant.  Please follow up with PCP for further evaluation and managemen

## 2018-02-11 ENCOUNTER — Other Ambulatory Visit: Payer: Self-pay | Admitting: Family Medicine

## 2018-02-11 NOTE — Telephone Encounter (Signed)
Requested Prescriptions  Pending Prescriptions Disp Refills  . amLODipine (NORVASC) 5 MG tablet [Pharmacy Med Name: AMLODIPINE  5MG   TAB] 90 tablet 1    Sig: TAKE 1 TABLET BY MOUTH  DAILY     Cardiovascular:  Calcium Channel Blockers Failed - 02/11/2018  5:30 AM      Failed - Last BP in normal range    BP Readings from Last 1 Encounters:  01/25/18 (!) 147/85         Passed - Valid encounter within last 6 months    Recent Outpatient Visits          1 month ago Welcome to [employer] preventive visit   Primary Care at [place], Lilia Argue, MD   8 months ago Essential hypertension, benign   Primary Care at [facility], Renette Butters, MD   9 months ago Acute bronchitis, unspecified organism   Primary Care at [place], [place]   1 year ago Routine physical examination   Primary Care at [facility], Renette Butters, MD   1 year ago Chest wall pain   Primary Care at [facility], Renette Butters, MD      Future Appointments            In 4 months Rutherford Guys, MD Primary Care at [place], [facility]         . losartan ([place]) 50 MG tablet [Pharmacy Med Name: LOSARTAN  50MG   TAB] 90 tablet 1    Sig: TAKE 1 TABLET BY MOUTH  DAILY     Cardiovascular:  Angiotensin Receptor Blockers Failed - 02/11/2018  5:30 AM      Failed - Last BP in normal range    BP Readings from Last 1 Encounters:  01/25/18 (!) 147/85         Passed - Cr in normal range and within 180 days    Creat  Date Value Ref Range Status  08/01/2015 1.03 0.70 - 1.25 mg/dL Final    Comment:      For patients > or = 66 years of age: The upper reference limit for Creatinine is approximately 13% higher for people identified as African-American.      Creatinine, Ser  Date Value Ref Range Status  01/01/2018 1.03 0.76 - 1.27 mg/dL Final         Passed - K in normal range and within 180 days    Potassium  Date Value Ref Range Status  01/01/2018 4.3 3.5 - 5.2 mmol/L Final         Passed - Patient is not pregnant     Passed - Valid encounter within last 6 months    Recent Outpatient Visits          1 month ago Welcome to [employer] preventive visit   Primary Care at [facility], Lilia Argue, MD   8 months ago Essential hypertension, benign   Primary Care at [facility], Renette Butters, MD   9 months ago Acute bronchitis, unspecified organism   Primary Care at [place], [place]   1 year ago Routine physical examination   Primary Care at [facility], Renette Butters, MD   1 year ago Chest wall pain   Primary Care at [facility], Renette Butters, MD      Future Appointments            In 4 months Rutherford Guys, MD Primary Care at [place], [facility]

## 2018-03-04 ENCOUNTER — Ambulatory Visit: Payer: Medicare Other | Admitting: Sports Medicine

## 2018-03-04 ENCOUNTER — Ambulatory Visit: Payer: Self-pay

## 2018-03-04 ENCOUNTER — Encounter: Payer: Self-pay | Admitting: Sports Medicine

## 2018-03-04 VITALS — BP 141/91 | Ht 67.0 in | Wt 163.0 lb

## 2018-03-04 DIAGNOSIS — M25511 Pain in right shoulder: Secondary | ICD-10-CM | POA: Diagnosis not present

## 2018-03-04 NOTE — Progress Notes (Signed)
HPI  CC: Right shoulder pain  Nathaniel Bender is a 68 year old male presents for right shoulder pain.  He states he was working on a fence yesterday when he reached out and pulled a piece of fencing.  He states he felt a sharp pain in his shoulder.  He states that since that time he has had pain with overhead activity.  He states he has pain at nighttime he lies affected side.  He states he is been taken Motrin 40 mg nightly, which he states does give him some relief in the pain.  He states it has gotten better since original onset.  He has no history of trauma to the area in the past.  He states he has not been numbness and tingling down his arm.  He has no associated grip strength weakness.  Past Injuries: None Past Surgeries: None Smoking: Former smoker, does not currently smoke. Family Hx: Noncontributory  ROS: Per HPI; in addition no fever, no rash, no additional weakness, no additional numbness, no additional paresthesias, and no additional falls/injury.   All past medical history, medications, and allergies reviewed myself at today's visit.  Objective: BP (!) 141/91   Ht 5\' 7"  (1.702 m)   Wt 163 lb (73.9 kg)   BMI 25.53 kg/m  Gen: Right-Hand Dominant. NAD, well groomed, a/o x3, normal affect.  CV: Well-perfused. Warm.  Resp: Non-labored.  Neuro: Sensation intact throughout. No gross coordination deficits.  Gait: Nonpathologic posture, unremarkable stride without signs of limp or balance issues.  Right shoulder exam: No erythema, warmth, swelling noted.  Tenderness palpation over the lateral shoulder.  Tenderness palpation of the bicipital groove.  Full range of motion in flexion, abduction, internal rotation, and external rotation.  Pain with greater than 60 degrees in abduction and forward flexion.  Strength 5/5 throughout testing.  Positive empty can test.  Positive speeds test.  Positive Hawkin's test.  Negative belly press off test, negative crossover test, negative O'Brien's  test.  ULTRASOUND: Shoulder, right Diagnostic complete ultrasound imaging obtained of patient's right shoulder.  - No obvious evidence of bony deformity or osteophyte development appreciated.  - Long head of the biceps tendon: No evidence of tendon thickening, calcification, subluxation, or tearing in short or long axis views. No edema or bullseye sign.  - Subscapularis tendon: complete visualization across the width of the insertion point yielded no evidence of tendon thickening, calcification, or tears in the long axis view.  - Supraspinatus tendon: complete visualization across the width of the insertion point yielded  evidence of hypoechoic changes and partial tear in the long axis view.  This was seen in short axis as well.  There was some surrounding associated fluid, with evidence of bursal inflammation. - Infraspinatus and teres minor tendons: visualization across the width of the insertion points yielded no evidence of tendon thickening, calcification, or tears in the long axis view.  - AC Joint: AC joint without evidence of joint separation or osteophyte development.  There was a positive geyser sign. IMPRESSION: findings consistent with partial tear of the supraspinatus tendon.  Assessment and Plan: Right shoulder pain, with partial tear of supraspinatus seen on ultrasound.  We discussed treatment options at today's visit.  He seems to be improving on conservative measures.  I recommend he continues oral NSAIDs as needed for pain relief.  I will give him range of motion shoulder exercises to work on at today's visit.  If he has any worsening pain, loss of strength, or new injury, I will  consider getting an MRI of the shoulder to further evaluate the cuff.  If he has no improvement in a month, I will consider getting him started in formal physical therapy.  Lewanda Rife, MD [facility] Fellow 03/04/2018 11:28 AM  I was the preceptor for this visit and available for  immediate consultation I personally reviewed ultrasound findings Shellia Cleverly, DO

## 2018-04-02 ENCOUNTER — Ambulatory Visit: Payer: Medicare Other | Admitting: Sports Medicine

## 2018-04-02 ENCOUNTER — Other Ambulatory Visit: Payer: Self-pay

## 2018-04-02 ENCOUNTER — Encounter: Payer: Self-pay | Admitting: Sports Medicine

## 2018-04-02 VITALS — BP 134/78 | Ht 66.0 in | Wt 165.0 lb

## 2018-04-02 DIAGNOSIS — M25511 Pain in right shoulder: Secondary | ICD-10-CM

## 2018-04-02 MED ORDER — MELOXICAM 15 MG PO TABS: 15.0000 mg | ORAL_TABLET | Freq: Every day | ORAL | 1 refills | Status: DC

## 2018-04-02 MED ORDER — NITROGLYCERIN 0.2 MG/HR TD PT24: MEDICATED_PATCH | TRANSDERMAL | 1 refills | Status: DC

## 2018-04-02 NOTE — Progress Notes (Signed)
  Nathaniel Bender - 68 y.o. male MRN [id number]  Date of birth: 1951-01-01    SUBJECTIVE:      Chief Complaint: Right shoulder pain  HPI:  68 year old male presents for follow-up for right shoulder pain.  4 weeks ago he was evaluated with ultrasound and found to have a partial-thickness supraspinatus tear.  He was instructed on home exercises. Today he states that he continues to have pain although it is not as sharp or immediate as previous.  Pain continues to be with flexion.  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  He notes that ice helps his pain.  He is also been taking Mobic which is been helpful.  He denies any new injuries.  No numbness or tingling.  No swelling, erythema, or bruising.   ROS:     See HPI  PERTINENT  PMH / PSH FH / / SH:  Past Medical, Surgical, Social, and Family History Reviewed & Updated in the EMR.    OBJECTIVE: BP 134/78   Ht 5\' 6"  (1.676 m)   Wt 165 lb (74.8 kg)   BMI 26.63 kg/m   Physical Exam:  Vital signs are reviewed.  GEN: Alert and oriented, NAD Pulm: Breathing unlabored PSY: normal mood, congruent affect  MSK: Right shoulder:  No obvious deformity or asymmetry. No bruising. No swelling Tenderness at the bicipital groove Full ROM in flexion, abduction, internal/external rotation.  Painful arc of motion in flexion from approximately 70 degrees to 130 degrees NV intact distally Special Tests:  - Impingement: Mild pain with Hawkins.  Negative Neers.  - Supraspinatus: Pain with empty can.  4+/5 strength - Infraspinatus/Teres: 5/5 strength with ER - Subscapularis: negative belly press. 5/5 strength with IR - Biceps tendon: Positive speeds.  - Labrum: Negative Obriens.  - AC Joint: Negative cross arm - Negative apprehension test  MSK [place]: [place] right shoulder  BT: Small split tear in slight thickening at the proximal most portions of  the biceps tendon Supraspinatus tendon:  Partial thickness tear of the supraspinatus, consistent with previous ultrasound.  No retraction. Subscapularis tendon: normal appearance without tears or degenerative changes Infraspinatus tendon:  normal appearance without tears or degenerative changes Teres Minor tendon:  normal appearance without tears or degenerative changes GH joint: normal appearing posterior labrum AC joint:  Mild degenerative changes, no geyser sign  Summary and Additional findings-stable partial-thickness tear of the supraspinatus without retraction  Ultrasound and interpretation by Coralyn Helling, DO and Wolfgang Phoenix. Fields, MD     ASSESSMENT & PLAN:  1.  Right shoulder pain- stable partial-thickness tear of the supraspinatus tendon without retraction. - We will start on nitroglycerin patches - Mobic as needed for pain control -Follow-up in 6 weeks  I observed and examined the patient with Dr. Okey Dupre and agree with assessment and plan.  Note reviewed and modified by me. Ila Mcgill, MD

## 2018-04-02 NOTE — Patient Instructions (Signed)
Today we will start you on nitroglycerine patches. See below protocol. Meloxicam once daily as needed for pain Follow up in 6 weeks.   Nitroglycerin Protocol   Apply 1/4 nitroglycerin patch to affected area daily.  Change position of patch within the affected area every 24 hours.  You may experience a headache during the first 1-2 weeks of using the patch, these should subside.  If you experience headaches after beginning nitroglycerin patch treatment, you may take your preferred over the counter pain reliever.  Another side effect of the nitroglycerin patch is skin irritation or rash related to patch adhesive.  Please notify our office if you develop more severe headaches or rash, and stop the patch.  Tendon healing with nitroglycerin patch may require 12 to 24 weeks depending on the extent of injury.  Men should not use if taking Viagra, Cialis, or Levitra.   Do not use if you have migraines or rosacea.

## 2018-06-20 ENCOUNTER — Other Ambulatory Visit: Payer: Self-pay | Admitting: Sports Medicine

## 2018-07-03 ENCOUNTER — Other Ambulatory Visit: Payer: Self-pay

## 2018-07-03 ENCOUNTER — Encounter: Payer: Self-pay | Admitting: Family Medicine

## 2018-07-03 ENCOUNTER — Ambulatory Visit (INDEPENDENT_AMBULATORY_CARE_PROVIDER_SITE_OTHER): Payer: Medicare Other | Admitting: Family Medicine

## 2018-07-03 VITALS — BP 138/84 | HR 46 | Temp 97.8°F | Ht 66.0 in | Wt 167.4 lb

## 2018-07-03 DIAGNOSIS — R42 Dizziness and giddiness: Secondary | ICD-10-CM

## 2018-07-03 DIAGNOSIS — I10 Essential (primary) hypertension: Secondary | ICD-10-CM

## 2018-07-03 DIAGNOSIS — R7303 Prediabetes: Secondary | ICD-10-CM

## 2018-07-03 DIAGNOSIS — E7800 Pure hypercholesterolemia, unspecified: Secondary | ICD-10-CM

## 2018-07-03 DIAGNOSIS — R001 Bradycardia, unspecified: Secondary | ICD-10-CM

## 2018-07-03 LAB — POCT CBC
Granulocyte percent: 46.3 %G (ref 37–80)
HCT, POC: 41.5 % — AB (ref 29–41)
Hemoglobin: 14.5 g/dL (ref 11–14.6)
Lymph, poc: 1.9 (ref 0.6–3.4)
MCH, POC: 31 pg (ref 27–31.2)
MCHC: 34.9 g/dL (ref 31.8–35.4)
MCV: 88.8 fL (ref 76–111)
MID (cbc): 0.5 (ref 0–0.9)
MPV: 8.5 fL (ref 0–99.8)
POC Granulocyte: 2.1 (ref 2–6.9)
POC LYMPH PERCENT: 42.6 %L (ref 10–50)
POC MID %: 11.1 %M (ref 0–12)
Platelet Count, POC: 134 10*3/uL — AB (ref 142–424)
RBC: 4.68 M/uL — AB (ref 4.69–6.13)
RDW, POC: 12.9 %
WBC: 4.5 10*3/uL — AB (ref 4.6–10.2)

## 2018-07-03 LAB — LIPID PANEL

## 2018-07-03 LAB — GLUCOSE, POCT (MANUAL RESULT ENTRY): POC Glucose: 82 mg/dl (ref 70–99)

## 2018-07-03 NOTE — Progress Notes (Signed)
6/12/202010:11 AM  Kayleen Memos Dec 31, 1950, 68 y.o., male [id number]  Chief Complaint  Patient presents with  . Dizziness    has low heart rate at [facility] on, here to follow up on chronic condition    HPI:   Patient is a 68 y.o. male with past medical history significant for HTN, GERD, back pain, HLP, prdiabetes who presents today for dizziness  Having intermittent periods of mild lightheadedness x 6 months Currently experiencing Not worse with movement or exertion Episodes lasts less than a minute Denies any associated sx: chest pain, SOB, palpitations, diaphoresis, vision changes, nausea, fatigue Reports other family members with low HR Has never seen cards Walks 3 miles a day, takes care of 8 acres and 2 horses Currently fasting  Fall Risk  07/03/2018 01/01/2018 06/02/2017 04/29/2017 12/16/2016  Falls in the past year? 1 0 No No No  Number falls in past yr: 0 - - - -  Injury with Fall? 0 - - - -     Depression screen [facility] 2/9 07/03/2018 01/01/2018 06/02/2017  Decreased Interest 0 0 0  Down, Depressed, Hopeless 0 0 0  PHQ - 2 Score 0 0 0    No Known Allergies  Prior to Admission medications   Medication Sig Start Date End Date Taking? Authorizing Provider  amLODipine (NORVASC) 5 MG tablet TAKE 1 TABLET BY MOUTH  DAILY 02/11/18  Yes Rutherford Guys, MD  aspirin EC 81 MG tablet Take 81 mg by mouth daily.   Yes [provider]  atorvastatin (LIPITOR) 40 MG tablet Take 1 tablet (40 mg total) by mouth daily. 01/11/18  Yes Rutherford Guys, MD  losartan (COZAAR) 50 MG tablet TAKE 1 TABLET BY MOUTH  DAILY 02/11/18  Yes Rutherford Guys, MD  meloxicam (MOBIC) 15 MG tablet TAKE 1 TABLET BY MOUTH EVERY DAY 06/22/18  Yes Stefanie Libel, MD  omeprazole (PRILOSEC) 20 MG capsule Take 1 capsule (20 mg total) by mouth daily. 06/02/17  Yes Wardell Honour, MD  OVER THE COUNTER MEDICATION Aleve taking 3-5 tabs a week.   Yes [provider]  tadalafil (ADCIRCA/CIALIS) 20 MG tablet  Take 0.5-1 tablets (10-20 mg total) by mouth every other day as needed for erectile dysfunction. 01/01/18  Yes Rutherford Guys, MD    Past Medical History:  Diagnosis Date  . Arthritis    Degenerative Disc Disease Cervical; s/p injection [facility].  . Cancer ([place]) 07/21/2013   Skin cancer unknown type  . Colon polyps   . GERD (gastroesophageal reflux disease)   . Gout   . Hyperlipidemia   . Hypertension   . Meniere's disease of left ear    s/p ENT evaluation; acute hearing loss; chronic tinnitus L now.  Recovered hearing completely.    Past Surgical History:  Procedure Laterality Date  . TONSILLECTOMY      Social History   Tobacco Use  . Smoking status: Never Smoker  . Smokeless tobacco: Former Network engineer Use Topics  . Alcohol use: Yes    Alcohol/week: 20.0 standard drinks    Types: 10 Glasses of wine, 10 Standard drinks or equivalent per week    Family History  Problem Relation Age of Onset  . Hyperlipidemia Mother   . Hypertension Mother   . Arthritis Mother 47       DDD lumbar spine  . Diabetes Father   . Heart disease Father 15       CAD/CABG  . Hyperlipidemia Father   .  Hypertension Father   . Hyperlipidemia Sister   . Hypertension Sister   . Heart disease Maternal Grandfather   . Mental illness Paternal Grandmother   . Stroke Paternal Grandfather     ROS Per hpi  OBJECTIVE:  Today's Vitals   07/03/18 0954  BP: 138/84  Pulse: (!) 46  Temp: 97.8 F (36.6 C)  TempSrc: Oral  SpO2: 97%  Weight: 167 lb 6.4 oz (75.9 kg)  Height: '5\' 6"'$  (1.676 m)   Body mass index is 27.02 kg/m.  Lying: 149/83, 46 Standing: 134/87, 58 Standing: 158/93 58   Physical Exam Vitals signs and nursing note reviewed.  Constitutional:      Appearance: He is well-developed.  HENT:     Head: Normocephalic and atraumatic.  Eyes:     Conjunctiva/sclera: Conjunctivae normal.     Pupils: Pupils are equal, round, and reactive to light.  Neck:      Musculoskeletal: Neck supple.     Thyroid: No thyromegaly.     Vascular: No carotid bruit.  Cardiovascular:     Rate and Rhythm: Regular rhythm. Bradycardia present.     Pulses: Normal pulses.     Heart sounds: Normal heart sounds. No murmur. No friction rub. No gallop.   Pulmonary:     Effort: Pulmonary effort is normal.     Breath sounds: Normal breath sounds. No wheezing or rales.  Musculoskeletal:     Right lower leg: No edema.     Left lower leg: No edema.  Skin:    General: Skin is warm and dry.  Neurological:     Mental Status: He is alert and oriented to person, place, and time.     Cranial Nerves: Cranial nerves are intact.     Sensory: Sensation is intact.     Motor: Motor function is intact.     Coordination: Coordination is intact.     Gait: Gait is intact.     Deep Tendon Reflexes: Reflexes are normal and symmetric.     My interpretation of EKG:  Sinus, HR 45, no ST changes, unchanged compared to 2016  Results for orders placed or performed in visit on 07/03/18 (from the past 24 hour(s))  POCT glucose (manual entry)     Status: None   Collection Time: 07/03/18 10:25 AM  Result Value Ref Range   POC Glucose 82 70 - 99 mg/dl  POCT CBC     Status: Abnormal   Collection Time: 07/03/18 10:26 AM  Result Value Ref Range   WBC 4.5 (A) 4.6 - 10.2 K/uL   Lymph, poc 1.9 0.6 - 3.4   POC LYMPH PERCENT 42.6 10 - 50 %L   MID (cbc) 0.5 0 - 0.9   POC MID % 11.1 0 - 12 %M   POC Granulocyte 2.1 2 - 6.9   Granulocyte percent 46.3 37 - 80 %G   RBC 4.68 (A) 4.69 - 6.13 M/uL   Hemoglobin 14.5 11 - 14.6 g/dL   HCT, POC 41.5 (A) 29 - 41 %   MCV 88.8 76 - 111 fL   MCH, POC 31.0 27 - 31.2 pg   MCHC 34.9 31.8 - 35.4 g/dL   RDW, POC 12.9 %   Platelet Count, POC 134 (A) 142 - 424 K/uL   MPV 8.5 0 - 99.8 fL    No results found.   ASSESSMENT and PLAN  1. Dizziness 2. Bradycardia Nonfocal exam. Sinus bradycardia. Referring to cards for further eval and treatment. - POCT CBC  -  POCT glucose (manual entry) - EKG 12-Lead - Ambulatory referral to Cardiology  3. Prediabetes Checking labs today, cont with LFM.  - Hemoglobin A1c  4. Essential hypertension, benign Controlled. Continue current regime.  - Lipid panel - TSH - CMP14+EGFR  5. Pure hypercholesterolemia Checking labs today, cont with LFM.  - Lipid panel - TSH - CMP14+EGFR  Return after cardiology workup.    Rutherford Guys, MD Primary Care at [place], [place] [address] Ph.  [phone] Fax [phone]

## 2018-07-03 NOTE — Patient Instructions (Signed)
° ° ° °  If you have lab work done today you will be contacted with your lab results within the next 2 weeks.  If you have not heard from us then please contact us. The fastest way to get your results is to register for My Chart. ° ° °IF you received an x-ray today, you will receive an invoice from [facility]. Please contact [facility] at [phone] with questions or concerns regarding your invoice.  ° °IF you received labwork today, you will receive an invoice from [facility]. Please contact [facility] at [phone] with questions or concerns regarding your invoice.  ° °Our billing staff will not be able to assist you with questions regarding bills from these companies. ° °You will be contacted with the lab results as soon as they are available. The fastest way to get your results is to activate your My Chart account. Instructions are located on the last page of this paperwork. If you have not heard from us regarding the results in 2 weeks, please contact this office. °  ° ° ° °

## 2018-07-04 LAB — CMP14+EGFR
ALT: 19 IU/L (ref 0–44)
AST: 17 IU/L (ref 0–40)
Albumin/Globulin Ratio: 2.4 — ABNORMAL HIGH (ref 1.2–2.2)
Albumin: 4.6 g/dL (ref 3.8–4.8)
Alkaline Phosphatase: 59 IU/L (ref 39–117)
BUN/Creatinine Ratio: 18 (ref 10–24)
BUN: 17 mg/dL (ref 8–27)
Bilirubin Total: 0.6 mg/dL (ref 0.0–1.2)
CO2: 25 mmol/L (ref 20–29)
Calcium: 9.3 mg/dL (ref 8.6–10.2)
Chloride: 103 mmol/L (ref 96–106)
Creatinine, Ser: 0.95 mg/dL (ref 0.76–1.27)
GFR calc Af Amer: 96 mL/min/{1.73_m2} (ref >59)
GFR calc non Af Amer: 83 mL/min/{1.73_m2} (ref >59)
Globulin, Total: 1.9 g/dL (ref 1.5–4.5)
Glucose: 98 mg/dL (ref 65–99)
Potassium: 4.3 mmol/L (ref 3.5–5.2)
Sodium: 141 mmol/L (ref 134–144)
Total Protein: 6.5 g/dL (ref 6.0–8.5)

## 2018-07-04 LAB — LIPID PANEL
Chol/HDL Ratio: 3.4 ratio (ref 0.0–5.0)
Cholesterol, Total: 133 mg/dL (ref 100–199)
HDL: 39 mg/dL — ABNORMAL LOW (ref >39)
LDL Calculated: 66 mg/dL (ref 0–99)
Triglycerides: 139 mg/dL (ref 0–149)
VLDL Cholesterol Cal: 28 mg/dL (ref 5–40)

## 2018-07-04 LAB — HEMOGLOBIN A1C
Est. average glucose Bld gHb Est-mCnc: 120 mg/dL
Hgb A1c MFr Bld: 5.8 % — ABNORMAL HIGH (ref 4.8–5.6)

## 2018-07-04 LAB — TSH: TSH: 2.12 u[IU]/mL (ref 0.450–4.500)

## 2018-07-06 ENCOUNTER — Telehealth: Payer: Self-pay | Admitting: Family Medicine

## 2018-07-06 NOTE — Telephone Encounter (Signed)
Copied from [place] [id number]. Topic: Quick Communication - Rx Refill/Question >> Jul 06, 2018  1:44 PM Celene Kras A wrote: Medication: Lipitor, omeprazole, losartan, atorvastatin  Has the patient contacted their pharmacy? Yes.  Pt states he has an order through the pharmacy already. Order #[id number] (Agent: If no, request that the patient contact the pharmacy for the refill.) (Agent: If yes, when and what did the pharmacy advise?)  Preferred Pharmacy (with phone number or street name): [place], [place] [facility] [address] [place] [address] Phone: [phone] Fax: [phone] Not a 24 hour pharmacy; exact hours not known.    Agent: Please be advised that RX refills may take up to 3 business days. We ask that you follow-up with your pharmacy.

## 2018-07-06 NOTE — Telephone Encounter (Signed)
Copied from [place] [id number]. Topic: Quick Communication - Rx Refill/Question >> Jul 06, 2018  1:44 PM Celene Kras A wrote: Medication: Lipitor, omeprazole, losartan, atorvastatin  Has the patient contacted their pharmacy? Yes.  Pt states he has an order through the pharmacy already. Order #[id number] (Agent: If no, request that the patient contact the pharmacy for the refill.) (Agent: If yes, when and what did the pharmacy advise?)  Preferred Pharmacy (with phone number or street name): [place], [place] [facility] [address] [place] [address] Phone: [phone] Fax: [phone] Not a 24 hour pharmacy; exact hours not known.    Agent: Please be advised that RX refills may take up to 3 business days. We ask that you follow-up with your pharmacy.

## 2018-07-07 MED ORDER — ATORVASTATIN CALCIUM 40 MG PO TABS: 40.0000 mg | ORAL_TABLET | Freq: Every day | ORAL | 1 refills | Status: DC

## 2018-07-07 MED ORDER — OMEPRAZOLE 20 MG PO CPDR: 20.0000 mg | DELAYED_RELEASE_CAPSULE | Freq: Every day | ORAL | 1 refills | Status: DC

## 2018-07-07 MED ORDER — LOSARTAN POTASSIUM 50 MG PO TABS: 50.0000 mg | ORAL_TABLET | Freq: Every day | ORAL | 1 refills | Status: DC

## 2018-07-07 MED ORDER — AMLODIPINE BESYLATE 5 MG PO TABS: 5.0000 mg | ORAL_TABLET | Freq: Every day | ORAL | 1 refills | Status: DC

## 2018-07-07 NOTE — Telephone Encounter (Signed)
Approved refills on atorvastatin 40 mg #90 w/1 refill, losartan 50 mg #90 w/1 refill, omeprazole 20 mg #90 w/1 refill, and amlodipne 5 mg #90 w/1 refill. Pt last ov 07/03/2018. Dgaddy, CMA

## 2018-07-07 NOTE — Telephone Encounter (Signed)
Medications refilled lipitor, omeprazole, losartan and amlodipine #90 w/1 refill on each. See refills under medication tab. Dgaddy, CMA

## 2018-07-16 NOTE — Telephone Encounter (Signed)
Patient says [facility] did not receive the approval for these scripts and to please resend. He would like a call once resent.

## 2018-07-17 NOTE — Telephone Encounter (Signed)
I called pt and stated that the medications was sent to [facility] mail service on 07/07/2018. Pt states understand and he will call his pharmacy back

## 2018-07-23 ENCOUNTER — Telehealth: Payer: Self-pay

## 2018-07-23 NOTE — Telephone Encounter (Signed)
   Pt informed of visitor's policy and to wear a mask. Pt voiced understanding. COVID-19 Pre-Screening Questions:  . In the past 7 to 10 days have you had a cough,  shortness of breath, headache, congestion, fever (100 or greater) body aches, chills, sore throat, or sudden loss of taste or sense of smell?No . Have you been around anyone with known Covid 19. No . Have you been around anyone who is awaiting Covid 19 test results in the past 7 to 10 days? No . Have you been around anyone who has been exposed to Covid 19, or has mentioned symptoms of Covid 19 within the past 7 to 10 days? No  If you have any concerns/questions about symptoms patients report during screening (either on the phone or at threshold). Contact the provider seeing the patient or DOD for further guidance.  If neither are available contact a member of the leadership team.

## 2018-07-28 ENCOUNTER — Ambulatory Visit (INDEPENDENT_AMBULATORY_CARE_PROVIDER_SITE_OTHER): Payer: Medicare Other | Admitting: Cardiology

## 2018-07-28 ENCOUNTER — Telehealth: Payer: Self-pay | Admitting: *Deleted

## 2018-07-28 ENCOUNTER — Encounter: Payer: Self-pay | Admitting: Cardiology

## 2018-07-28 ENCOUNTER — Other Ambulatory Visit: Payer: Self-pay

## 2018-07-28 VITALS — BP 138/76 | HR 48 | Ht 67.0 in | Wt 167.0 lb

## 2018-07-28 DIAGNOSIS — R42 Dizziness and giddiness: Secondary | ICD-10-CM

## 2018-07-28 DIAGNOSIS — R001 Bradycardia, unspecified: Secondary | ICD-10-CM

## 2018-07-28 DIAGNOSIS — E7800 Pure hypercholesterolemia, unspecified: Secondary | ICD-10-CM | POA: Diagnosis not present

## 2018-07-28 DIAGNOSIS — Z7189 Other specified counseling: Secondary | ICD-10-CM

## 2018-07-28 DIAGNOSIS — I10 Essential (primary) hypertension: Secondary | ICD-10-CM

## 2018-07-28 NOTE — Patient Instructions (Signed)
Medication Instructions:  Your Physician recommend you continue on your current medication as directed.    If you need a refill on your cardiac medications before your next appointment, please call your pharmacy.   Lab work: None  Testing/Procedures: Our physician has recommended that you wear an 7  DAY ZIO-PATCH monitor. The Zio patch cardiac monitor continuously records heart rhythm data for up to 14 days, this is for patients being evaluated for multiple types heart rhythms. For the first 24 hours post application, please avoid getting the Zio monitor wet in the shower or by excessive sweating during exercise. After that, feel free to carry on with regular activities. Keep soaps and lotions away from the ZIO XT Patch.   This will be placed at our [facility] location - [address], [address].      Follow-Up: At [facility], you and your health needs are our priority.  As part of our continuing mission to provide you with exceptional heart care, we have created designated Provider Care Teams.  These Care Teams include your primary Cardiologist (physician) and Advanced Practice Providers (APPs -  Physician Assistants and Nurse Practitioners) who all work together to provide you with the care you need, when you need it. You will need a follow up appointment as needed  Please call our office 2 months in advance to schedule this appointment.  You may see Dr. Harrell Gave or one of the following Advanced Practice Providers on your designated Care Team:   Rosaria Ferries, PA-C . Jory Sims, DNP, ANP  *

## 2018-07-28 NOTE — Telephone Encounter (Signed)
7 day ZIO XT long term holter monitor to be mailed to patients home.  Instructions reviewed briefly as they are included in the monitor kit. 

## 2018-07-28 NOTE — Progress Notes (Signed)
Cardiology Office Note:    Date:  07/28/2018   ID:  Nathaniel Bender, DOB Oct 27, 1950, MRN [id number]  PCP:  Rutherford Guys, MD  Cardiologist:  Buford Dresser, MD PhD  Referring MD: Rutherford Guys, MD   CC: new consult for bradycardia  History of Present Illness:    Nathaniel Bender is a 68 y.o. male with a hx of HTN, HLD, glucose intolerance who is seen as a new consult at the request of Rutherford Guys, MD for the evaluation and management of bradycardia.  Per Dr. Ardyth Gal notes, he has been having lightheadedness for the last 6 mos. His HR at his visit was 46 bpm. Was not hypotensive. ECG showed sinus bradycardia at 45 bpm. Only prior ECG in 2016 shows sinus bradycardia at 43 bpm.   Retired several months ago, staying very active. He is active at baseline, caring for horses/land. Walks about 3 mi/day. He is a never smoker but does consume a significant amount of alcohol (2 glasses wine/day). No recent MJ use (distant when young). He reports that multiple member of his family have low heart rates.  Today: Wants to review low HR (runs in his family). Is having dizziness, wondering if related.   Infrequent dizzy spells. Not related to physical activity. Having an episode now in the office, HR is stable. BP stable. Has had vertigo in the past, not like vertigo. Feels lightheaded with it. No syncope. Has been going on about 3 mos. No clear triggers. Only better with laying down/closing eyes. No headaches.   Denies chest pain, shortness of breath at rest or with normal exertion. No PND, orthopnea, LE edema or unexpected weight gain. No syncope or palpitations.  Has BP cuff at home, doesn't check routinely. Always elevated in the office.   Past Medical History:  Diagnosis Date   Arthritis    Degenerative Disc Disease Cervical; s/p injection [facility].   Cancer ([place]) 07/21/2013   Skin cancer unknown type   Colon polyps    GERD (gastroesophageal reflux disease)    Gout     Hyperlipidemia    Hypertension    Meniere's disease of left ear    s/p ENT evaluation; acute hearing loss; chronic tinnitus L now.  Recovered hearing completely.    Past Surgical History:  Procedure Laterality Date   TONSILLECTOMY      Current Medications: Current Outpatient Medications on File Prior to Visit  Medication Sig   amLODipine (NORVASC) 5 MG tablet Take 1 tablet (5 mg total) by mouth daily.   aspirin EC 81 MG tablet Take 81 mg by mouth daily.   atorvastatin (LIPITOR) 40 MG tablet Take 1 tablet (40 mg total) by mouth daily.   losartan (COZAAR) 50 MG tablet Take 1 tablet (50 mg total) by mouth daily.   omeprazole (PRILOSEC) 20 MG capsule Take 1 capsule (20 mg total) by mouth daily.   OVER THE COUNTER MEDICATION Aleve taking 3-5 tabs a week.   tadalafil (ADCIRCA/CIALIS) 20 MG tablet Take 0.5-1 tablets (10-20 mg total) by mouth every other day as needed for erectile dysfunction.   No current facility-administered medications on file prior to visit.      Allergies:   Patient has no known allergies.   Social History   Socioeconomic History   Marital status: Married    Spouse name: Not on file   Number of children: Not on file   Years of education: Not on file   Highest education level: Not on file  Occupational History   Occupation: Environmental consultant strain: Not on file   Food insecurity    Worry: Not on file    Inability: Not on file   Transportation needs    Medical: Not on file    Non-medical: Not on file  Tobacco Use   Smoking status: Never Smoker   Smokeless tobacco: Former Systems developer  Substance and Sexual Activity   Alcohol use: Yes    Alcohol/week: 20.0 standard drinks    Types: 10 Glasses of wine, 10 Standard drinks or equivalent per week   Drug use: No   Sexual activity: Yes  Lifestyle   Physical activity    Days per week: Not on file    Minutes per session: Not on file   Stress: Not on file    Relationships   Social connections    Talks on phone: Not on file    Gets together: Not on file    Attends religious service: Not on file    Active member of club or organization: Not on file    Attends meetings of clubs or organizations: Not on file    Relationship status: Not on file  Other Topics Concern   Not on file  Social History Narrative   Marital:  Married. X 26 years.        Children: none      Lives: with wife       Education: College/Other. Exercise: Yes.      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      Tobacco: never      Alcohol:  2-3 per day; more on weekends; wine.      Exercise:  Walking 2 days per week; 2-4 miles.      Seatbelt:  100%      Guns: 12 gauge shot gun.      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       Family History: The patient's family history includes Arthritis (age of onset: 57) in his mother; Diabetes in his father; Heart disease in his maternal grandfather; Heart disease (age of onset: 72) in his father; Hyperlipidemia in his father, mother, and sister; Hypertension in his father, mother, and sister; Mental illness in his paternal grandmother; Stroke in his paternal grandfather.   Mat gpa died when he was in high school, unknown type of heart disease Father died 4 years ago, heart disease/other factors at 66 yo. Diagnosed in his 67s, had open heart surgery for CAD.  Mother is 86, very healthy, no known heart disease.  ROS:   Please see the history of present illness.  Additional pertinent ROS:  Constitutional: Negative for chills, fever, night sweats, unintentional weight loss  HENT: Negative for ear pain and hearing loss.   Eyes: Negative for loss of vision and eye pain.  Respiratory: Negative for cough, sputum, shortness of breath, wheezing.   Cardiovascular: See HPI. Gastrointestinal: Negative for abdominal pain, melena,  and hematochezia.  Genitourinary: Negative for dysuria and hematuria.  Musculoskeletal: Negative for falls and myalgias.  Skin: Negative for itching and rash.  Neurological: Negative for focal weakness, focal sensory changes and loss of consciousness.  Endo/Heme/Allergies: Does not bruise/bleed easily.    EKGs/Labs/Other Studies Reviewed:    The following studies were reviewed today: No prior cardiac evaluation  EKG:  EKG is personally reviewed.  The ekg ordered today demonstrates sinus bradycardia, left axis deviation  Recent Labs: 07/03/2018: ALT 19; BUN 17; Creatinine, Ser 0.95; Hemoglobin 14.5; Potassium 4.3; Sodium 141; TSH 2.120  Recent Lipid Panel    Component Value Date/Time   CHOL 133 07/03/2018 1024   TRIG 139 07/03/2018 1024   HDL 39 (L) 07/03/2018 1024   CHOLHDL 3.4 07/03/2018 1024   CHOLHDL 5.2 (H) 08/01/2015 1303   VLDL 44 (H) 08/01/2015 1303   LDLCALC 66 07/03/2018 1024    Physical Exam:    VS:  BP (!) 145/82    Pulse (!) 48    Ht 5\' 7"  (1.702 m)    Wt 167 lb (75.8 kg)    BMI 26.16 kg/m     Wt Readings from Last 3 Encounters:  07/28/18 167 lb (75.8 kg)  07/03/18 167 lb 6.4 oz (75.9 kg)  04/02/18 165 lb (74.8 kg)     GEN: Well nourished, well developed in no acute distress HEENT: Normal NECK: No JVD; No carotid bruits LYMPHATICS: No lymphadenopathy CARDIAC: regular rhythm, normal S1 and S2, no murmurs, rubs, gallops. Radial and DP pulses 2+ bilaterally. RESPIRATORY:  Clear to auscultation without rales, wheezing or rhonchi  ABDOMEN: Soft, non-tender, non-distended MUSCULOSKELETAL:  No edema; No deformity  SKIN: Warm and dry NEUROLOGIC:  Alert and oriented x 3 PSYCHIATRIC:  Normal affect   ASSESSMENT:    1. Bradycardia   2. Light headedness   3. Essential hypertension   4. Pure hypercholesterolemia   5. Cardiac risk counseling   6. Counseling on health promotion and disease prevention    PLAN:    Bradycardia: discussed mechanism of HR control  (sympathetic/parasympathetic) at length in the office today. He reports that his HR has always been low, as has the heart rate of other family members. Never had symptoms with it before -will order 7 day Zio to evaluate for pauses/high degree block -avoid AV nodal agents  Lightheadedness: having an episode while in the office, vitals/ECG stable. Not like vertigo he has had in the past -will evaluate rhythm first -if monitor unremarkable, unlikely cardiac in nature -counseled on sitting/lying down with significant symptoms -denies syncope/presyncope, no falls/injury from symptoms  Hypertension: -continue amlodipine 5 mg, losartan 50 mg daily -reports white coat hypertension, may benefit from home cuff readings  Hyperlipidemia: fasting labs 07/03/18 with TG 139, LDL 66 -continue atorvastatin for elevated ASCVD risk  Cardiac risk counseling and prevention recommendations: -recommend heart healthy/Mediterranean diet, with whole grains, fruits, vegetable, fish, lean meats, nuts, and olive oil. Limit salt. -recommend moderate walking, 3-5 times/week for 30-50 minutes each session. Aim for at least 150 minutes.week. Goal should be pace of 3 miles/hours, or walking 1.5 miles in 30 minutes -recommend avoidance of tobacco products. Avoid excess alcohol. -taking 81 mg aspirin daily, reviewed current guidelines -ASCVD risk score: The 10-year ASCVD risk score Mikey Bussing DC Brooke Bonito., et al., 2013) is: 17.2%   Values used to calculate the score:     Age: 23 years     Sex: Male     Is Non-Hispanic African American: No     Diabetic: No     Tobacco smoker: No     Systolic Blood Pressure: 102 mmHg     Is BP treated: Yes     HDL Cholesterol: 39 mg/dL     Total Cholesterol: 133 mg/dL    Plan for follow up: if monitor is unremarkable, can follow up as needed. If any significant abnormalities seen on monitor, will arrange  for echo and follow up  Medication Adjustments/Labs and Tests Ordered: Current medicines  are reviewed at length with the patient today.  Concerns regarding medicines are outlined above.  Orders Placed This Encounter  Procedures   LONG TERM MONITOR (3-14 DAYS)   EKG 12-Lead   No orders of the defined types were placed in this encounter.   Patient Instructions  Medication Instructions:  Your Physician recommend you continue on your current medication as directed.    If you need a refill on your cardiac medications before your next appointment, please call your pharmacy.   Lab work: None  Testing/Procedures: Our physician has recommended that you wear an 7  DAY ZIO-PATCH monitor. The Zio patch cardiac monitor continuously records heart rhythm data for up to 14 days, this is for patients being evaluated for multiple types heart rhythms. For the first 24 hours post application, please avoid getting the Zio monitor wet in the shower or by excessive sweating during exercise. After that, feel free to carry on with regular activities. Keep soaps and lotions away from the ZIO XT Patch.   This will be placed at our [facility] location - [address], [address].      Follow-Up: At [facility], you and your health needs are our priority.  As part of our continuing mission to provide you with exceptional heart care, we have created designated Provider Care Teams.  These Care Teams include your primary Cardiologist (physician) and Advanced Practice Providers (APPs -  Physician Assistants and Nurse Practitioners) who all work together to provide you with the care you need, when you need it. You will need a follow up appointment as needed  Please call our office 2 months in advance to schedule this appointment.  You may see Dr. Harrell Gave or one of the following Advanced Practice Providers on your designated Care Team:   Rosaria Ferries, PA-C  Jory Sims, DNP, ANP  *      Signed, Buford Dresser, MD PhD 07/28/2018 10:09 PM    Nathaniel Bender

## 2018-07-28 NOTE — Telephone Encounter (Signed)
Pt calling back and states that Pharmacy did not receive medication. Pt is upset because this is the third time calling. Please advise    [phone]

## 2018-07-30 ENCOUNTER — Other Ambulatory Visit: Payer: Self-pay | Admitting: Family Medicine

## 2018-07-30 ENCOUNTER — Other Ambulatory Visit: Payer: Self-pay

## 2018-07-30 ENCOUNTER — Encounter: Payer: Self-pay | Admitting: Cardiology

## 2018-07-30 DIAGNOSIS — I10 Essential (primary) hypertension: Secondary | ICD-10-CM

## 2018-07-30 DIAGNOSIS — E7800 Pure hypercholesterolemia, unspecified: Secondary | ICD-10-CM

## 2018-07-30 DIAGNOSIS — K219 Gastro-esophageal reflux disease without esophagitis: Secondary | ICD-10-CM

## 2018-07-30 MED ORDER — ATORVASTATIN CALCIUM 40 MG PO TABS: 40.0000 mg | ORAL_TABLET | Freq: Every day | ORAL | 1 refills | Status: DC

## 2018-07-30 MED ORDER — AMLODIPINE BESYLATE 5 MG PO TABS: 5.0000 mg | ORAL_TABLET | Freq: Every day | ORAL | 1 refills | Status: DC

## 2018-07-30 MED ORDER — OMEPRAZOLE 20 MG PO CPDR: 20.0000 mg | DELAYED_RELEASE_CAPSULE | Freq: Every day | ORAL | 1 refills | Status: DC

## 2018-07-30 MED ORDER — LOSARTAN POTASSIUM 50 MG PO TABS: 50.0000 mg | ORAL_TABLET | Freq: Every day | ORAL | 1 refills | Status: DC

## 2018-07-30 NOTE — Telephone Encounter (Signed)
Pt upset, has sent multiple requests for medication.  Advised refills were sent on 06/16.  Can understand that add'l refills were not completed as everything in our system shows refills complete but apologized for confusion and delay.  We also spoke with [facility] to determine if PA was needed by [facility] stated PA was not needed and confirmed receipt of 06/16 Rx.  Unsure why they are telling pt that refills are needed.  Resent x4 meds. Marland Kitchen

## 2018-07-30 NOTE — Telephone Encounter (Signed)
REFILL amLODipine (NORVASC) 5 MG tablet atorvastatin (LIPITOR) 40 MG tablet  losartan (COZAAR) 50 MG tablet omeprazole (PRILOSEC) 20 MG capsule  PHARMACY  [facility] [place], [place] [phone] (Phone) [phone] (Fax)   Patient has been trying to get these RX refills since 6/15. Patient states they never got sent to the pharmacy back on the 6/15. Patient was upset regarding this, can patient get a call back when this is sent it. Call back # [phone]

## 2018-08-04 ENCOUNTER — Other Ambulatory Visit: Payer: Self-pay

## 2018-08-04 ENCOUNTER — Telehealth: Payer: Self-pay

## 2018-08-04 DIAGNOSIS — E7800 Pure hypercholesterolemia, unspecified: Secondary | ICD-10-CM

## 2018-08-04 DIAGNOSIS — I10 Essential (primary) hypertension: Secondary | ICD-10-CM

## 2018-08-04 DIAGNOSIS — K219 Gastro-esophageal reflux disease without esophagitis: Secondary | ICD-10-CM

## 2018-08-04 MED ORDER — AMLODIPINE BESYLATE 5 MG PO TABS: 5.0000 mg | ORAL_TABLET | Freq: Every day | ORAL | 1 refills | Status: DC

## 2018-08-04 MED ORDER — ATORVASTATIN CALCIUM 40 MG PO TABS: 40.0000 mg | ORAL_TABLET | Freq: Every day | ORAL | 1 refills | Status: DC

## 2018-08-04 MED ORDER — OMEPRAZOLE 20 MG PO CPDR: 20.0000 mg | DELAYED_RELEASE_CAPSULE | Freq: Every day | ORAL | 1 refills | Status: DC

## 2018-08-04 MED ORDER — LOSARTAN POTASSIUM 50 MG PO TABS: 50.0000 mg | ORAL_TABLET | Freq: Every day | ORAL | 1 refills | Status: DC

## 2018-08-04 NOTE — Telephone Encounter (Signed)
Pt c/b.  Still having issues with [facility] and refills.  Resent to [facility] per pt request.  Spoke to pharmacy staff, received refills x 4.  Advised pt and asked to c/b if he has any other issues.

## 2018-08-06 ENCOUNTER — Other Ambulatory Visit: Payer: Self-pay

## 2018-08-06 DIAGNOSIS — E7800 Pure hypercholesterolemia, unspecified: Secondary | ICD-10-CM

## 2018-08-06 DIAGNOSIS — I10 Essential (primary) hypertension: Secondary | ICD-10-CM

## 2018-08-06 DIAGNOSIS — K219 Gastro-esophageal reflux disease without esophagitis: Secondary | ICD-10-CM

## 2018-08-06 MED ORDER — LOSARTAN POTASSIUM 50 MG PO TABS: 50.0000 mg | ORAL_TABLET | Freq: Every day | ORAL | 1 refills | Status: DC

## 2018-08-06 MED ORDER — OMEPRAZOLE 20 MG PO CPDR: 20.0000 mg | DELAYED_RELEASE_CAPSULE | Freq: Every day | ORAL | 1 refills | Status: DC

## 2018-08-06 MED ORDER — ATORVASTATIN CALCIUM 40 MG PO TABS: 40.0000 mg | ORAL_TABLET | Freq: Every day | ORAL | 1 refills | Status: DC

## 2018-08-06 MED ORDER — AMLODIPINE BESYLATE 5 MG PO TABS: 5.0000 mg | ORAL_TABLET | Freq: Every day | ORAL | 1 refills | Status: DC

## 2018-08-07 ENCOUNTER — Ambulatory Visit (INDEPENDENT_AMBULATORY_CARE_PROVIDER_SITE_OTHER): Payer: Medicare Other

## 2018-08-07 DIAGNOSIS — R001 Bradycardia, unspecified: Secondary | ICD-10-CM

## 2018-08-07 DIAGNOSIS — R42 Dizziness and giddiness: Secondary | ICD-10-CM | POA: Diagnosis not present

## 2019-01-29 ENCOUNTER — Other Ambulatory Visit: Payer: Self-pay | Admitting: Family Medicine

## 2019-01-29 DIAGNOSIS — K219 Gastro-esophageal reflux disease without esophagitis: Secondary | ICD-10-CM

## 2019-01-29 DIAGNOSIS — I10 Essential (primary) hypertension: Secondary | ICD-10-CM

## 2019-01-29 DIAGNOSIS — E7800 Pure hypercholesterolemia, unspecified: Secondary | ICD-10-CM

## 2019-01-29 NOTE — Telephone Encounter (Signed)
Please get patient schedule for me refill visit

## 2019-01-29 NOTE — Telephone Encounter (Signed)
Pt scheduled  

## 2019-01-29 NOTE — Telephone Encounter (Signed)
Forwarding medication refill requests to the clinical pool for review. 

## 2019-02-05 ENCOUNTER — Encounter: Payer: Self-pay | Admitting: Family Medicine

## 2019-02-05 ENCOUNTER — Other Ambulatory Visit: Payer: Self-pay

## 2019-02-05 ENCOUNTER — Ambulatory Visit (INDEPENDENT_AMBULATORY_CARE_PROVIDER_SITE_OTHER): Payer: Medicare Other | Admitting: Family Medicine

## 2019-02-05 VITALS — BP 124/80 | HR 53 | Temp 97.5°F | Ht 67.0 in | Wt 168.0 lb

## 2019-02-05 DIAGNOSIS — R7303 Prediabetes: Secondary | ICD-10-CM

## 2019-02-05 DIAGNOSIS — I10 Essential (primary) hypertension: Secondary | ICD-10-CM | POA: Diagnosis not present

## 2019-02-05 DIAGNOSIS — E7800 Pure hypercholesterolemia, unspecified: Secondary | ICD-10-CM

## 2019-02-05 DIAGNOSIS — K219 Gastro-esophageal reflux disease without esophagitis: Secondary | ICD-10-CM | POA: Diagnosis not present

## 2019-02-05 DIAGNOSIS — N521 Erectile dysfunction due to diseases classified elsewhere: Secondary | ICD-10-CM

## 2019-02-05 DIAGNOSIS — Z125 Encounter for screening for malignant neoplasm of prostate: Secondary | ICD-10-CM

## 2019-02-05 MED ORDER — OMEPRAZOLE 20 MG PO CPDR: 20.0000 mg | DELAYED_RELEASE_CAPSULE | Freq: Every day | ORAL | 1 refills | Status: DC

## 2019-02-05 MED ORDER — LOSARTAN POTASSIUM 50 MG PO TABS: 50.0000 mg | ORAL_TABLET | Freq: Every day | ORAL | 1 refills | Status: DC

## 2019-02-05 MED ORDER — AMLODIPINE BESYLATE 5 MG PO TABS: 5.0000 mg | ORAL_TABLET | Freq: Every day | ORAL | 1 refills | Status: DC

## 2019-02-05 MED ORDER — ATORVASTATIN CALCIUM 40 MG PO TABS: 40.0000 mg | ORAL_TABLET | Freq: Every day | ORAL | 1 refills | Status: DC

## 2019-02-05 MED ORDER — TADALAFIL 20 MG PO TABS: 10.0000 mg | ORAL_TABLET | ORAL | 5 refills | Status: DC | PRN

## 2019-02-05 NOTE — Progress Notes (Signed)
1/15/20211:51 PM  Nathaniel Bender 11-09-1950, 69 y.o., male [id number]  Chief Complaint  Patient presents with  . Hypertension    roator mcuff surgery on 01/21/19, in pt, doing weil    HPI:   Patient is a 69 y.o. male with past medical history significant for HTN, GERD, back pain, HLP, prediabtes who presents today for routine followup  Last OV June 2020 - sent to cards for sx bradycardia, saw then in July 2020 - holter ordered - brief episodes of SVT that were not sx (not being tx given bradycardia and infrequency/asyptomatic nature of events) and occ PVCs which were symptomatic  Patient reports that overall he has been doing well Had right rotator cuff surgery on Jan 21 2019 and is recovering well, will be in sling for about another month, doing PT  Continues to walk at least 3 miles a day Working from home Taking all meds as prescribed Has no acute concerns today  Lab Results  Component Value Date   HGBA1C 5.8 (H) 07/03/2018   HGBA1C 5.9 (H) 06/02/2017   HGBA1C 5.9 (H) 12/16/2016   Lab Results  Component Value Date   LDLCALC 66 07/03/2018   CREATININE 0.95 07/03/2018    Depression screen PHQ 2/9 02/05/2019 07/03/2018 01/01/2018  Decreased Interest 0 0 0  Down, Depressed, Hopeless 0 0 0  PHQ - 2 Score 0 0 0    Fall Risk  02/05/2019 07/03/2018 01/01/2018 06/02/2017 04/29/2017  Falls in the past year? 0 1 0 No No  Number falls in past yr: 0 0 - - -  Injury with Fall? 0 0 - - -     No Known Allergies  Prior to Admission medications   Medication Sig Start Date End Date Taking? Authorizing Provider  amLODipine (NORVASC) 5 MG tablet Take 1 tablet (5 mg total) by mouth daily. 08/06/18  Yes Rutherford Guys, MD  aspirin EC 81 MG tablet Take 81 mg by mouth daily.   Yes [provider]  atorvastatin (LIPITOR) 40 MG tablet Take 1 tablet (40 mg total) by mouth daily. 08/06/18  Yes Rutherford Guys, MD  losartan (COZAAR) 50 MG tablet Take 1 tablet (50 mg total) by mouth  daily. 08/06/18  Yes Rutherford Guys, MD  omeprazole (PRILOSEC) 20 MG capsule Take 1 capsule (20 mg total) by mouth daily. 08/06/18  Yes Rutherford Guys, MD  OVER THE COUNTER MEDICATION Aleve taking 3-5 tabs a week.   Yes [provider]  tadalafil (ADCIRCA/CIALIS) 20 MG tablet Take 0.5-1 tablets (10-20 mg total) by mouth every other day as needed for erectile dysfunction. 01/01/18  Yes Rutherford Guys, MD    Past Medical History:  Diagnosis Date  . Arthritis    Degenerative Disc Disease Cervical; s/p injection [facility].  . Cancer ([place]) 07/21/2013   Skin cancer unknown type  . Colon polyps   . GERD (gastroesophageal reflux disease)   . Gout   . Hyperlipidemia   . Hypertension   . Meniere's disease of left ear    s/p ENT evaluation; acute hearing loss; chronic tinnitus L now.  Recovered hearing completely.    Past Surgical History:  Procedure Laterality Date  . TONSILLECTOMY      Social History   Tobacco Use  . Smoking status: Never Smoker  . Smokeless tobacco: Former Network engineer Use Topics  . Alcohol use: Yes    Alcohol/week: 20.0 standard drinks    Types: 10 Glasses of wine, 10 Standard  drinks or equivalent per week    Family History  Problem Relation Age of Onset  . Hyperlipidemia Mother   . Hypertension Mother   . Arthritis Mother 50       DDD lumbar spine  . Diabetes Father   . Heart disease Father 53       CAD/CABG  . Hyperlipidemia Father   . Hypertension Father   . Hyperlipidemia Sister   . Hypertension Sister   . Heart disease Maternal Grandfather   . Mental illness Paternal Grandmother   . Stroke Paternal Grandfather     Review of Systems  Constitutional: Negative for chills and fever.  Respiratory: Negative for cough and shortness of breath.   Cardiovascular: Negative for chest pain, palpitations and leg swelling.  Gastrointestinal: Negative for abdominal pain, heartburn, nausea and vomiting.  per hpi   OBJECTIVE:  Today's  Vitals   02/05/19 1341  BP: 124/80  Pulse: (!) 53  Temp: (!) 97.5 F (36.4 C)  SpO2: 98%  Weight: 168 lb (76.2 kg)  Height: 5\' 7"  (1.702 m)   Body mass index is 26.31 kg/m.  Wt Readings from Last 3 Encounters:  02/05/19 168 lb (76.2 kg)  07/28/18 167 lb (75.8 kg)  07/03/18 167 lb 6.4 oz (75.9 kg)    Physical Exam Vitals and nursing note reviewed.  Constitutional:      Appearance: He is well-developed.  HENT:     Head: Normocephalic and atraumatic.  Eyes:     Conjunctiva/sclera: Conjunctivae normal.     Pupils: Pupils are equal, round, and reactive to light.  Cardiovascular:     Rate and Rhythm: Normal rate and regular rhythm.     Heart sounds: No murmur. No friction rub. No gallop.   Pulmonary:     Effort: Pulmonary effort is normal.     Breath sounds: Normal breath sounds. No wheezing or rales.  Musculoskeletal:     Cervical back: Neck supple.     Right lower leg: No edema.     Left lower leg: No edema.  Skin:    General: Skin is warm and dry.  Neurological:     Mental Status: He is alert and oriented to person, place, and time.     No results found for this or any previous visit (from the past 24 hour(s)).  No results found.   ASSESSMENT and PLAN  1. Essential hypertension, benign Controlled. Continue current regime.  - amLODipine (NORVASC) 5 MG tablet; Take 1 tablet (5 mg total) by mouth daily. - repeat BP - losartan (COZAAR) 50 MG tablet; Take 1 tablet (50 mg total) by mouth daily. - Comprehensive metabolic panel; Future  2. Pure hypercholesterolemia Controlled. Continue current regime.  - atorvastatin (LIPITOR) 40 MG tablet; Take 1 tablet (40 mg total) by mouth daily. - Lipid panel; Future - TSH; Future  3. Gastroesophageal reflux disease without esophagitis Controlled. Continue current regime.  - omeprazole (PRILOSEC) 20 MG capsule; Take 1 capsule (20 mg total) by mouth daily.  4. Erectile dysfunction due to diseases classified elsewhere -  tadalafil (CIALIS) 20 MG tablet; Take 0.5-1 tablets (10-20 mg total) by mouth every other day as needed for erectile dysfunction.  5. Prediabetes Controlled. Continue current regime.  - Hemoglobin A1c; Future  6. Screening for prostate cancer - PSA; Future  Return in about 6 months (around 08/05/2019) for annual wellness visit with me. Fasting labs 3-4 days prior to Dora, MD [facility]  [place], [place] [address] Ph.  [phone] Fax [phone]

## 2019-02-05 NOTE — Patient Instructions (Signed)
° ° ° °  If you have lab work done today you will be contacted with your lab results within the next 2 weeks.  If you have not heard from us then please contact us. The fastest way to get your results is to register for My Chart. ° ° °IF you received an x-ray today, you will receive an invoice from [facility]. Please contact [facility] at [phone] with questions or concerns regarding your invoice.  ° °IF you received labwork today, you will receive an invoice from [facility]. Please contact [facility] at [phone] with questions or concerns regarding your invoice.  ° °Our billing staff will not be able to assist you with questions regarding bills from these companies. ° °You will be contacted with the lab results as soon as they are available. The fastest way to get your results is to activate your My Chart account. Instructions are located on the last page of this paperwork. If you have not heard from us regarding the results in 2 weeks, please contact this office. °  ° ° ° °

## 2019-02-10 ENCOUNTER — Ambulatory Visit: Payer: Medicare Other | Attending: Internal Medicine

## 2019-02-10 DIAGNOSIS — Z23 Encounter for immunization: Secondary | ICD-10-CM | POA: Insufficient documentation

## 2019-02-10 NOTE — Progress Notes (Signed)
.    Covid-19 Vaccination Clinic  Name:  Nathaniel Bender    MRN: [id number] DOB: 04-13-1950  02/10/2019  [name] was observed post Covid-19 immunization for 15 minutes without incidence. He was provided with Vaccine Information Sheet and instruction to access the V-Safe system.   [name] was instructed to call 911 with any severe reactions post vaccine: Marland Kitchen Difficulty breathing  . Swelling of your face and throat  . A fast heartbeat  . A bad rash all over your body  . Dizziness and weakness    Immunizations Administered    Name Date Dose VIS Date Route   Pfizer COVID-19 Vaccine 02/10/2019  8:31 AM 0.3 mL 01/01/2019 Intramuscular   Manufacturer: [place]   Lot: [id number]   [place]: [id number]

## 2019-02-21 ENCOUNTER — Encounter: Payer: Self-pay | Admitting: Family Medicine

## 2019-02-21 DIAGNOSIS — N521 Erectile dysfunction due to diseases classified elsewhere: Secondary | ICD-10-CM

## 2019-02-26 MED ORDER — TADALAFIL 20 MG PO TABS: 10.0000 mg | ORAL_TABLET | ORAL | 5 refills | Status: DC | PRN

## 2019-03-01 ENCOUNTER — Ambulatory Visit: Payer: Medicare Other | Attending: Internal Medicine

## 2019-03-01 DIAGNOSIS — Z23 Encounter for immunization: Secondary | ICD-10-CM | POA: Insufficient documentation

## 2019-03-01 NOTE — Progress Notes (Signed)
   Covid-19 Vaccination Clinic  Name:  Kerek Varghese    MRN: [id number] DOB: December 17, 1950  03/01/2019  Mr. Kozloff was observed post Covid-19 immunization for 15 minutes without incidence. He was provided with Vaccine Information Sheet and instruction to access the V-Safe system.   Mr. Hodgkin was instructed to call 911 with any severe reactions post vaccine: Marland Kitchen Difficulty breathing  . Swelling of your face and throat  . A fast heartbeat  . A bad rash all over your body  . Dizziness and weakness    Immunizations Administered    Name Date Dose VIS Date Route   Pfizer COVID-19 Vaccine 03/01/2019 10:47 AM 0.3 mL 01/01/2019 Intramuscular   Manufacturer: [place]   Lot: [id number]   [place]: [id number]

## 2019-05-06 ENCOUNTER — Encounter: Payer: Self-pay | Admitting: Physician Assistant

## 2019-05-06 ENCOUNTER — Ambulatory Visit: Payer: Medicare Other | Admitting: Physician Assistant

## 2019-05-06 ENCOUNTER — Other Ambulatory Visit: Payer: Self-pay

## 2019-05-06 DIAGNOSIS — W57XXXA Bitten or stung by nonvenomous insect and other nonvenomous arthropods, initial encounter: Secondary | ICD-10-CM | POA: Diagnosis not present

## 2019-05-06 DIAGNOSIS — S70261A Insect bite (nonvenomous), right hip, initial encounter: Secondary | ICD-10-CM

## 2019-05-06 DIAGNOSIS — Z1283 Encounter for screening for malignant neoplasm of skin: Secondary | ICD-10-CM | POA: Diagnosis not present

## 2019-05-06 DIAGNOSIS — S80862A Insect bite (nonvenomous), left lower leg, initial encounter: Secondary | ICD-10-CM | POA: Diagnosis not present

## 2019-05-06 DIAGNOSIS — R21 Rash and other nonspecific skin eruption: Secondary | ICD-10-CM

## 2019-05-06 DIAGNOSIS — L570 Actinic keratosis: Secondary | ICD-10-CM

## 2019-05-06 MED ORDER — TRIAMCINOLONE ACETONIDE 0.1 % EX CREA: 1.0000 "application " | TOPICAL_CREAM | Freq: Two times a day (BID) | CUTANEOUS | 5 refills | Status: DC | PRN

## 2019-05-06 MED ORDER — MUPIROCIN 2 % EX OINT: 1.0000 "application " | TOPICAL_OINTMENT | Freq: Two times a day (BID) | CUTANEOUS | 0 refills | Status: DC

## 2019-05-12 ENCOUNTER — Telehealth: Payer: Self-pay | Admitting: *Deleted

## 2019-05-12 LAB — ANAEROBIC AND AEROBIC CULTURE
AER RESULT:: NO GROWTH
MICRO NUMBER:: 10367754
MICRO NUMBER:: 10367755
SPECIMEN QUALITY:: ADEQUATE
SPECIMEN QUALITY:: ADEQUATE

## 2019-05-12 NOTE — Telephone Encounter (Signed)
-----   Message from Warren Danes, [place] sent at 05/11/2019  4:27 PM EDT ----- No infection

## 2019-05-12 NOTE — Telephone Encounter (Signed)
Informed patient that bacterial culture didn't show any infection and patient stated spot is healing

## 2019-06-07 ENCOUNTER — Other Ambulatory Visit: Payer: Self-pay | Admitting: Family Medicine

## 2019-07-03 NOTE — Progress Notes (Signed)
   Follow-Up Visit   Subjective  Nathaniel Bender is a 69 y.o. male who presents for the following: Annual Exam (Right cheek x 1 yr-itches, right eyebrow-itches, right armpit-raw and red,  Left calf-bite x 21months ago, back-lookover).   The following portions of the chart were reviewed this encounter and updated as appropriate: Allergies      Objective  Well appearing patient in no apparent distress; mood and affect are within normal limits.  All skin waist up examined.  Objective  Right axillae: Thick yellow scale  Objective  Head - Anterior (Face) (5), left side of face, right cheek: Erythematous patches with gritty scale.  Objective  Left Lower Leg - Anterior: Punctate red papule with surrounding erythema   Assessment & Plan  Screening exam for skin cancer Chest - Medial (Center)  Rash and other nonspecific skin eruption Right axillae  Anaerobic and Aerobic Culture - Right axillae  AK (actinic keratosis) (7) Head - Anterior (Face) (5); left side of face; right cheek  Destruction of lesion - Head - Anterior (Face), left side of face, right cheek Complexity: simple   Destruction method: cryotherapy   Informed consent: discussed and consent obtained   Timeout:  patient name, date of birth, surgical site, and procedure verified Lesion destroyed using liquid nitrogen: Yes   Cryotherapy cycles:  1 Outcome: patient tolerated procedure well with no complications   Post-procedure details: wound care instructions given    Insect bite of right hip with local reaction, initial encounter Left Lower Leg - Anterior

## 2019-07-03 NOTE — Addendum Note (Signed)
Addended by: Robyne Askew R on: 07/03/2019 03:05 PM   Modules accepted: Level of Service

## 2019-07-30 ENCOUNTER — Other Ambulatory Visit: Payer: Self-pay

## 2019-07-30 ENCOUNTER — Ambulatory Visit (INDEPENDENT_AMBULATORY_CARE_PROVIDER_SITE_OTHER): Payer: Medicare Other | Admitting: Family Medicine

## 2019-07-30 DIAGNOSIS — I10 Essential (primary) hypertension: Secondary | ICD-10-CM

## 2019-07-30 DIAGNOSIS — E7800 Pure hypercholesterolemia, unspecified: Secondary | ICD-10-CM

## 2019-07-30 DIAGNOSIS — R7303 Prediabetes: Secondary | ICD-10-CM

## 2019-07-30 DIAGNOSIS — Z125 Encounter for screening for malignant neoplasm of prostate: Secondary | ICD-10-CM

## 2019-07-31 LAB — COMPREHENSIVE METABOLIC PANEL
ALT: 25 IU/L (ref 0–44)
AST: 22 IU/L (ref 0–40)
Albumin/Globulin Ratio: 2 (ref 1.2–2.2)
Albumin: 4.9 g/dL — ABNORMAL HIGH (ref 3.8–4.8)
Alkaline Phosphatase: 81 IU/L (ref 48–121)
BUN/Creatinine Ratio: 15 (ref 10–24)
BUN: 17 mg/dL (ref 8–27)
Bilirubin Total: 0.6 mg/dL (ref 0.0–1.2)
CO2: 25 mmol/L (ref 20–29)
Calcium: 10 mg/dL (ref 8.6–10.2)
Chloride: 103 mmol/L (ref 96–106)
Creatinine, Ser: 1.1 mg/dL (ref 0.76–1.27)
GFR calc Af Amer: 80 mL/min/{1.73_m2} (ref >59)
GFR calc non Af Amer: 69 mL/min/{1.73_m2} (ref >59)
Globulin, Total: 2.4 g/dL (ref 1.5–4.5)
Glucose: 105 mg/dL — ABNORMAL HIGH (ref 65–99)
Potassium: 4.2 mmol/L (ref 3.5–5.2)
Sodium: 142 mmol/L (ref 134–144)
Total Protein: 7.3 g/dL (ref 6.0–8.5)

## 2019-07-31 LAB — LIPID PANEL
Chol/HDL Ratio: 3.9 ratio (ref 0.0–5.0)
Cholesterol, Total: 172 mg/dL (ref 100–199)
HDL: 44 mg/dL (ref >39)
LDL Chol Calc (NIH): 96 mg/dL (ref 0–99)
Triglycerides: 183 mg/dL — ABNORMAL HIGH (ref 0–149)
VLDL Cholesterol Cal: 32 mg/dL (ref 5–40)

## 2019-07-31 LAB — PSA: Prostate Specific Ag, Serum: 1.5 ng/mL (ref 0.0–4.0)

## 2019-07-31 LAB — HEMOGLOBIN A1C
Est. average glucose Bld gHb Est-mCnc: 123 mg/dL
Hgb A1c MFr Bld: 5.9 % — ABNORMAL HIGH (ref 4.8–5.6)

## 2019-07-31 LAB — TSH: TSH: 2.62 u[IU]/mL (ref 0.450–4.500)

## 2019-08-05 ENCOUNTER — Other Ambulatory Visit: Payer: Self-pay | Admitting: Family Medicine

## 2019-08-05 DIAGNOSIS — E7800 Pure hypercholesterolemia, unspecified: Secondary | ICD-10-CM

## 2019-08-05 DIAGNOSIS — I10 Essential (primary) hypertension: Secondary | ICD-10-CM

## 2019-08-06 ENCOUNTER — Other Ambulatory Visit: Payer: Self-pay

## 2019-08-06 ENCOUNTER — Encounter: Payer: Self-pay | Admitting: Family Medicine

## 2019-08-06 ENCOUNTER — Ambulatory Visit (INDEPENDENT_AMBULATORY_CARE_PROVIDER_SITE_OTHER): Payer: Medicare Other | Admitting: Family Medicine

## 2019-08-06 VITALS — BP 128/74 | HR 53 | Temp 98.0°F | Resp 15 | Ht 67.0 in | Wt 167.0 lb

## 2019-08-06 DIAGNOSIS — E7800 Pure hypercholesterolemia, unspecified: Secondary | ICD-10-CM | POA: Diagnosis not present

## 2019-08-06 DIAGNOSIS — Z0001 Encounter for general adult medical examination with abnormal findings: Secondary | ICD-10-CM

## 2019-08-06 DIAGNOSIS — N521 Erectile dysfunction due to diseases classified elsewhere: Secondary | ICD-10-CM

## 2019-08-06 DIAGNOSIS — I10 Essential (primary) hypertension: Secondary | ICD-10-CM

## 2019-08-06 DIAGNOSIS — Z0000 Encounter for general adult medical examination without abnormal findings: Secondary | ICD-10-CM

## 2019-08-06 DIAGNOSIS — K219 Gastro-esophageal reflux disease without esophagitis: Secondary | ICD-10-CM | POA: Diagnosis not present

## 2019-08-06 MED ORDER — AMLODIPINE BESYLATE 5 MG PO TABS: 5.0000 mg | ORAL_TABLET | Freq: Every day | ORAL | 1 refills | Status: DC

## 2019-08-06 MED ORDER — TADALAFIL (PAH) 20 MG PO TABS: ORAL_TABLET | ORAL | 5 refills | Status: DC

## 2019-08-06 MED ORDER — ATORVASTATIN CALCIUM 40 MG PO TABS: 40.0000 mg | ORAL_TABLET | Freq: Every day | ORAL | 1 refills | Status: DC

## 2019-08-06 MED ORDER — LOSARTAN POTASSIUM 50 MG PO TABS: 50.0000 mg | ORAL_TABLET | Freq: Every day | ORAL | 1 refills | Status: DC

## 2019-08-06 MED ORDER — OMEPRAZOLE 20 MG PO CPDR: 20.0000 mg | DELAYED_RELEASE_CAPSULE | Freq: Every day | ORAL | 1 refills | Status: DC

## 2019-08-06 NOTE — Progress Notes (Signed)
Presents today for [employer] Visit-Subsequent.   Date of last exam: Jan 01 2018  Interpreter used for this visit? n/a  Patient Care Team: Rutherford Guys, MD as PCP - General (Family Medicine) Buford Dresser, MD as PCP - Cardiology (Cardiology) Warren Danes, PA-C as Physician Assistant (Dermatology)   Other items to address today: none Taking all meds as prescribed, no acute issues, needs refills  Cancer Screening: Colon: colonoscopy 2106, repeat in 10 years, eagle Prostate: July 2021  Other Screening: Last screening for diabetes: July 2021, prediabetes Last lipid screening: HLP, July 2021  Lab Results  Component Value Date   CHOL 172 07/30/2019   HDL 44 07/30/2019   LDLCALC 96 07/30/2019   TRIG 183 (H) 07/30/2019   CHOLHDL 3.9 07/30/2019    ADVANCE DIRECTIVES: Discussed: yes Patient desires: DNR On File: no, patient will bring in copies Patient reports all advance directives were brought up to date this spring  Immunization status:  Immunization History  Administered Date(s) Administered  . Influenza Split 11/10/2011, 10/21/2012, 01/20/2016  . Influenza, High Dose Seasonal PF 01/01/2018, 11/30/2018  . Influenza,inj,Quad PF,6+ Mos 12/29/2013, 01/13/2015, 12/16/2016  . PFIZER SARS-COV-2 Vaccination 02/10/2019, 03/01/2019  . Pneumococcal Conjugate-13 06/02/2017  . Td 01/22/2008  . Zoster 01/21/2010     Health Maintenance Due  Topic Date Due  . PNA vac Low Risk Adult (2 of 2 - PPSV23) 06/03/2018     Functional Status Survey: Is the patient deaf or have difficulty hearing?: No Does the patient have difficulty seeing, even when wearing glasses/contacts?: No Does the patient have difficulty concentrating, remembering, or making decisions?: No Does the patient have difficulty walking or climbing stairs?: No Does the patient have difficulty dressing or bathing?: No Does the patient have difficulty doing errands alone such  as visiting a doctor's office or shopping?: No   6CIT Screen 08/06/2019 01/01/2018  What Year? 0 points 0 points  What month? 0 points 0 points  What time? 0 points 0 points  Count back from 20 0 points 0 points  Months in reverse 0 points 0 points  Repeat phrase 0 points 0 points  Total Score 0 0     Home Environment: safe no falls  Urinary Incontinence Screening:  Denies any issues  Patient Active Problem List   Diagnosis Date Noted  . Recurrent bronchospasm 05/18/2016  . Glucose intolerance (impaired glucose tolerance) 01/13/2015  . Hypogonadism in male 01/13/2015  . Gastroesophageal reflux disease without esophagitis 03/29/2014  . Erectile dysfunction due to diseases classified elsewhere 03/29/2014  . Essential hypertension, benign 01/19/2014  . Hyperlipidemia 01/19/2014  . Sudden idiopathic hearing loss 11/20/2011     Past Medical History:  Diagnosis Date  . Arthritis    Degenerative Disc Disease Cervical; s/p injection [facility].  . Atypical mole 05/04/2013   Right Wrist-Severe  . Cancer ([place]) 07/21/2013   Skin cancer unknown type  . Colon polyps   . GERD (gastroesophageal reflux disease)   . Gout   . Hyperlipidemia   . Hypertension   . Meniere's disease of left ear    s/p ENT evaluation; acute hearing loss; chronic tinnitus L now.  Recovered hearing completely.  Marland Kitchen SCCA (squamous cell carcinoma) of skin 08/08/2014   Left Forehead-Well Diff (curet and 5FU)     Past Surgical History:  Procedure Laterality Date  . ROTATOR CUFF REPAIR Right   . TONSILLECTOMY       Family History  Problem Relation Age of  Onset  . Hyperlipidemia Mother   . Hypertension Mother   . Arthritis Mother 8       DDD lumbar spine  . Diabetes Father   . Heart disease Father 71       CAD/CABG  . Hyperlipidemia Father   . Hypertension Father   . Hyperlipidemia Sister   . Hypertension Sister   . Heart disease Maternal Grandfather   . Mental illness Paternal Grandmother     . Stroke Paternal Grandfather      Social History   Socioeconomic History  . Marital status: Married    Spouse name: Not on file  . Number of children: Not on file  . Years of education: Not on file  . Highest education level: Not on file  Occupational History  . Occupation: Sales  Tobacco Use  . Smoking status: Never Smoker  . Smokeless tobacco: Former Network engineer and Sexual Activity  . Alcohol use: Yes    Alcohol/week: 20.0 standard drinks    Types: 10 Glasses of wine, 10 Standard drinks or equivalent per week  . Drug use: No  . Sexual activity: Yes  Other Topics Concern  . Not on file  Social History Narrative   Marital:  Married. X 26 years.        Children: none      Lives: with wife       Education: College/Other. Exercise: Yes.      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      Tobacco: never      Alcohol:  2-3 per day; more on weekends; wine.      Exercise:  Walking 2 days per week; 2-4 miles.      Seatbelt:  100%      Guns: 12 gauge shot gun.      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     Social Determinants of Health   Financial Resource Strain:   . Difficulty of Paying Living Expenses:   Food Insecurity:   . Worried About Charity fundraiser in the Last Year:   . Arboriculturist in the Last Year:   Transportation Needs:   . Film/video editor (Medical):   Marland Kitchen Lack of Transportation (Non-Medical):   Physical Activity:   . Days of Exercise per Week:   . Minutes of Exercise per Session:   Stress:   . Feeling of Stress :   Social Connections:   . Frequency of Communication with Friends and Family:   . Frequency of Social Gatherings with Friends and Family:   . Attends Religious Services:   . Active Member of Clubs or Organizations:   . Attends Archivist Meetings:   Marland Kitchen Marital Status:   Intimate Partner Violence:   .  Fear of Current or Ex-Partner:   . Emotionally Abused:   Marland Kitchen Physically Abused:   . Sexually Abused:      No Known Allergies   Prior to Admission medications   Medication Sig Start Date End Date Taking? Authorizing Provider  amLODipine (NORVASC) 5 MG tablet Take 1 tablet (5 mg total) by mouth daily. 02/05/19  Yes Rutherford Guys, MD  aspirin EC 81 MG tablet Take 81 mg by mouth daily.   Yes [provider]  atorvastatin (LIPITOR) 40 MG tablet TAKE 1 TABLET BY MOUTH EVERY DAY 08/05/19  Yes Rutherford Guys, MD  losartan (COZAAR) 50 MG tablet TAKE 1 TABLET BY MOUTH EVERY DAY 08/05/19  Yes Rutherford Guys, MD  omeprazole (PRILOSEC) 20 MG capsule Take 1 capsule (20 mg total) by mouth daily. 02/05/19  Yes Rutherford Guys, MD  OVER THE COUNTER MEDICATION Aleve taking 3-5 tabs a week.   Yes [provider]  tadalafil, PAH, (ADCIRCA) 20 MG tablet TAKE 1/2 TO 1 TABLET (10-20MG  TOTAL) BY MOUTH EVERY OTHER DAY AS NEEDED FOR ERECTILE DYSFUNCTION 06/07/19  Yes Rutherford Guys, MD  mupirocin ointment (BACTROBAN) 2 % Apply 1 application topically 2 (two) times daily. Patient not taking: Reported on 08/06/2019 05/06/19   Robyne Askew R, PA-C  tadalafil (CIALIS) 20 MG tablet Take 0.5-1 tablets (10-20 mg total) by mouth every other day as needed for erectile dysfunction. Patient not taking: Reported on 08/06/2019 02/26/19   Rutherford Guys, MD  triamcinolone cream (KENALOG) 0.1 % Apply 1 application topically 2 (two) times daily as needed. Patient not taking: Reported on 08/06/2019 05/06/19   Warren Danes, [place]     Depression screen [facility] 2/9 08/06/2019 02/05/2019 07/03/2018 01/01/2018 06/02/2017  Decreased Interest 0 0 0 0 0  Down, Depressed, Hopeless 0 0 0 0 0  PHQ - 2 Score 0 0 0 0 0     Fall Risk  08/06/2019 02/05/2019 07/03/2018 01/01/2018 06/02/2017  Falls in the past year? 0 0 1 0 No  Number falls in past yr: 0 0 0 - -  Injury with Fall? 0 0 0 - -  Risk for fall due to : No Fall  Risks - - - -  Follow up Falls evaluation completed - - - -   Review of Systems  Constitutional: Negative for chills, fever, malaise/fatigue and weight loss.  HENT: Negative for hearing loss.   Eyes: Negative for blurred vision and double vision.  Respiratory: Negative for cough and shortness of breath.   Cardiovascular: Negative for chest pain, palpitations and leg swelling.  Gastrointestinal: Negative for abdominal pain, blood in stool, constipation, diarrhea, melena, nausea and vomiting.  Genitourinary: Negative for dysuria, frequency, hematuria and urgency.  Musculoskeletal: Positive for joint pain.  Neurological: Positive for tingling (fingertips). Negative for dizziness, focal weakness and headaches.  Endo/Heme/Allergies: Negative for polydipsia.  Psychiatric/Behavioral: Negative for depression. The patient is not nervous/anxious and does not have insomnia.      PHYSICAL EXAM: BP 128/74   Pulse (!) 53   Temp 98 F (36.7 C) (Temporal)   Resp 15   Ht 5\' 7"  (1.702 m)   Wt 167 lb (75.8 kg)   SpO2 98%   BMI 26.16 kg/m    Wt Readings from Last 3 Encounters:  08/06/19 167 lb (75.8 kg)  02/05/19 168 lb (76.2 kg)  07/28/18 167 lb (75.8 kg)    Physical Exam Constitutional:      Appearance: Normal appearance.  HENT:     Head: Normocephalic and atraumatic.     Right Ear: Hearing, tympanic membrane, ear canal and external ear normal.     Left Ear: Hearing, tympanic membrane, ear canal and external ear normal.     Mouth/Throat:     Pharynx: No oropharyngeal exudate.  Eyes:     Extraocular Movements: Extraocular movements intact.     Conjunctiva/sclera: Conjunctivae normal.     Pupils: Pupils are equal, round, and reactive to light.  Neck:     Thyroid: No thyromegaly.     Vascular: No carotid bruit.  Cardiovascular:     Rate and Rhythm: Normal rate and regular rhythm.     Heart sounds: Normal heart sounds. No murmur heard.  No friction rub. No gallop.   Pulmonary:      Effort: Pulmonary effort is normal.     Breath sounds: Normal breath sounds. No wheezing, rhonchi or rales.  Abdominal:     General: Bowel sounds are normal. There is no distension.     Palpations: Abdomen is soft. There is no mass.     Tenderness: There is no abdominal tenderness.  Musculoskeletal:        General: Normal range of motion.     Cervical back: Neck supple.     Right lower leg: No edema.     Left lower leg: No edema.  Lymphadenopathy:     Cervical: No cervical adenopathy.  Skin:    General: Skin is warm and dry.  Neurological:     Mental Status: He is alert and oriented to person, place, and time.     Cranial Nerves: No cranial nerve deficit.     Gait: Gait is intact.     Deep Tendon Reflexes: Reflexes are normal and symmetric.  Psychiatric:        Mood and Affect: Mood and affect normal.        Behavior: Behavior normal.       Physical Exam Constitutional:      Appearance: Normal appearance.  HENT:     Head: Normocephalic and atraumatic.     Right Ear: Hearing, tympanic membrane, ear canal and external ear normal.     Left Ear: Hearing, tympanic membrane, ear canal and external ear normal.     Mouth/Throat:     Pharynx: No oropharyngeal exudate.  Eyes:     Extraocular Movements: Extraocular movements intact.     Conjunctiva/sclera: Conjunctivae normal.     Pupils: Pupils are equal, round, and reactive to light.  Neck:     Thyroid: No thyromegaly.     Vascular: No carotid bruit.  Cardiovascular:     Rate and Rhythm: Normal rate and regular rhythm.     Heart sounds: Normal heart sounds. No murmur heard.  No friction rub. No gallop.   Pulmonary:     Effort: Pulmonary effort is normal.     Breath sounds: Normal breath sounds. No wheezing, rhonchi or rales.  Abdominal:     General: Bowel sounds are normal. There is no distension.     Palpations: Abdomen is soft. There is no mass.     Tenderness: There is no abdominal tenderness.  Musculoskeletal:         General: Normal range of motion.     Cervical back: Neck supple.     Right lower leg: No edema.     Left lower leg: No edema.  Lymphadenopathy:     Cervical: No cervical adenopathy.  Skin:    General: Skin is warm and dry.  Neurological:     Mental Status: He is alert and oriented to person, place, and time.     Cranial Nerves: No cranial nerve deficit.     Gait: Gait is intact.     Deep Tendon Reflexes: Reflexes are normal and symmetric.  Psychiatric:        Mood and Affect: Mood and affect normal.        Behavior: Behavior normal.      ASSESSMENT/PLAN:  1. Encounter for Medicare annual wellness exam Education/Counseling provided regarding diet and exercise, prevention  of chronic diseases, smoking/tobacco cessation, if applicable, and reviewed "Covered Medicare Preventive Services."  2. Essential hypertension, benign Controlled. Continue current regime.  - amLODipine (NORVASC) 5 MG tablet; Take 1 tablet (5 mg total) by mouth daily. - losartan (COZAAR) 50 MG tablet; Take 1 tablet (50 mg total) by mouth daily.  3. Pure hypercholesterolemia Controlled. Continue current regime.  - atorvastatin (LIPITOR) 40 MG tablet; Take 1 tablet (40 mg total) by mouth daily.  4. Gastroesophageal reflux disease without esophagitis Discussed weaning off to H2B (pepcid) - omeprazole (PRILOSEC) 20 MG capsule; Take 1 capsule (20 mg total) by mouth daily.  5. Erectile dysfunction due to diseases classified elsewhere - tadalafil, PAH, (ADCIRCA) 20 MG tablet; TAKE 1/2 TO 1 TABLET (10-20MG  TOTAL) BY MOUTH EVERY OTHER DAY AS NEEDED FOR ERECTILE DYSFUNCTION  Return in about 6 months (around 02/06/2020).

## 2019-08-06 NOTE — Patient Instructions (Addendum)
   If you have lab work done today you will be contacted with your lab results within the next 2 weeks.  If you have not heard from us then please contact us. The fastest way to get your results is to register for My Chart.   IF you received an x-ray today, you will receive an invoice from [facility]. Please contact [facility] at [phone] with questions or concerns regarding your invoice.   IF you received labwork today, you will receive an invoice from [facility]. Please contact [facility] at [phone] with questions or concerns regarding your invoice.   Our billing staff will not be able to assist you with questions regarding bills from these companies.  You will be contacted with the lab results as soon as they are available. The fastest way to get your results is to activate your My Chart account. Instructions are located on the last page of this paperwork. If you have not heard from us regarding the results in 2 weeks, please contact this office.     Preventive Care 65 Years and Older, Male Preventive care refers to lifestyle choices and visits with your health care provider that can promote health and wellness. This includes:  A yearly physical exam. This is also called an annual well check.  Regular dental and eye exams.  Immunizations.  Screening for certain conditions.  Healthy lifestyle choices, such as diet and exercise. What can I expect for my preventive care visit? Physical exam Your health care provider will check:  Height and weight. These may be used to calculate body mass index (BMI), which is a measurement that tells if you are at a healthy weight.  Heart rate and blood pressure.  Your skin for abnormal spots. Counseling Your health care provider may ask you questions about:  Alcohol, tobacco, and drug use.  Emotional well-being.  Home and relationship well-being.  Sexual activity.  Eating habits.  History of  falls.  Memory and ability to understand (cognition).  Work and work environment. What immunizations do I need?  Influenza (flu) vaccine  This is recommended every year. Tetanus, diphtheria, and pertussis (Tdap) vaccine  You may need a Td booster every 10 years. Varicella (chickenpox) vaccine  You may need this vaccine if you have not already been vaccinated. Zoster (shingles) vaccine  You may need this after age 60. Pneumococcal conjugate (PCV13) vaccine  One dose is recommended after age 65. Pneumococcal polysaccharide (PPSV23) vaccine  One dose is recommended after age 65. Measles, mumps, and rubella (MMR) vaccine  You may need at least one dose of MMR if you were born in 1957 or later. You may also need a second dose. Meningococcal conjugate (MenACWY) vaccine  You may need this if you have certain conditions. Hepatitis A vaccine  You may need this if you have certain conditions or if you travel or work in places where you may be exposed to hepatitis A. Hepatitis B vaccine  You may need this if you have certain conditions or if you travel or work in places where you may be exposed to hepatitis B. Haemophilus influenzae type b (Hib) vaccine  You may need this if you have certain conditions. You may receive vaccines as individual doses or as more than one vaccine together in one shot (combination vaccines). Talk with your health care provider about the risks and benefits of combination vaccines. What tests do I need? Blood tests  Lipid and cholesterol levels. These may be checked every 5   years, or more frequently depending on your overall health.  Hepatitis C test.  Hepatitis B test. Screening  Lung cancer screening. You may have this screening every year starting at age 2 if you have a 30-pack-year history of smoking and currently smoke or have quit within the past 15 years.  Colorectal cancer screening. All adults should have this screening starting at age 62  and continuing until age 29. Your health care provider may recommend screening at age 76 if you are at increased risk. You will have tests every 1-10 years, depending on your results and the type of screening test.  Prostate cancer screening. Recommendations will vary depending on your family history and other risks.  Diabetes screening. This is done by checking your blood sugar (glucose) after you have not eaten for a while (fasting). You may have this done every 1-3 years.  Abdominal aortic aneurysm (AAA) screening. You may need this if you are a current or former smoker.  Sexually transmitted disease (STD) testing. Follow these instructions at home: Eating and drinking  Eat a diet that includes fresh fruits and vegetables, whole grains, lean protein, and low-fat dairy products. Limit your intake of foods with high amounts of sugar, saturated fats, and salt.  Take vitamin and mineral supplements as recommended by your health care provider.  Do not drink alcohol if your health care provider tells you not to drink.  If you drink alcohol: ? Limit how much you have to 0-2 drinks a day. ? Be aware of how much alcohol is in your drink. In the [place] one drink equals one 12 oz bottle of beer (355 mL), one 5 oz glass of wine (148 mL), or one 1 oz glass of hard liquor (44 mL). Lifestyle  Take daily care of your teeth and gums.  Stay active. Exercise for at least 30 minutes on 5 or more days each week.  Do not use any products that contain nicotine or tobacco, such as cigarettes, e-cigarettes, and chewing tobacco. If you need help quitting, ask your health care provider.  If you are sexually active, practice safe sex. Use a condom or other form of protection to prevent STIs (sexually transmitted infections).  Talk with your health care provider about taking a low-dose aspirin or statin. What's next?  Visit your health care provider once a year for a well check visit.  Ask your health care  provider how often you should have your eyes and teeth checked.  Stay up to date on all vaccines. This information is not intended to replace advice given to you by your health care provider. Make sure you discuss any questions you have with your health care provider. Document Revised: 01/01/2018 Document Reviewed: 01/01/2018 Elsevier Patient Education  2020 [employer].

## 2019-12-27 DIAGNOSIS — M23300 Other meniscus derangements, unspecified lateral meniscus, right knee: Secondary | ICD-10-CM | POA: Diagnosis not present

## 2019-12-27 DIAGNOSIS — M25561 Pain in right knee: Secondary | ICD-10-CM | POA: Diagnosis not present

## 2020-01-04 DIAGNOSIS — Z20822 Contact with and (suspected) exposure to covid-19: Secondary | ICD-10-CM | POA: Diagnosis not present

## 2020-01-05 DIAGNOSIS — M25561 Pain in right knee: Secondary | ICD-10-CM | POA: Diagnosis not present

## 2020-01-12 DIAGNOSIS — M25561 Pain in right knee: Secondary | ICD-10-CM | POA: Diagnosis not present

## 2020-02-21 ENCOUNTER — Other Ambulatory Visit: Payer: Self-pay | Admitting: Family Medicine

## 2020-02-21 DIAGNOSIS — K219 Gastro-esophageal reflux disease without esophagitis: Secondary | ICD-10-CM

## 2020-02-21 MED ORDER — OMEPRAZOLE 20 MG PO CPDR: 20.0000 mg | DELAYED_RELEASE_CAPSULE | Freq: Every day | ORAL | 0 refills | Status: DC

## 2020-02-21 NOTE — Telephone Encounter (Signed)
Medication:  omeprazole (PRILOSEC) 20 MG capsule [[id number]]   Has the patient contacted their pharmacy? Yes   (Agent: If yes, when and what did the pharmacy advise?) to call the office   Preferred Pharmacy (with phone number or street name):  [id number] [place], [place] - [address]  [address] [place], [place] [address]  Phone:  [phone] Fax:  [phone]  Agent: Please be advised that RX refills may take up to 3 business days. We ask that you follow-up with your pharmacy.

## 2020-02-21 NOTE — Telephone Encounter (Signed)
Requested medication (s) are due for refill today - yes  Requested medication (s) are on the active medication list -yes  Future visit scheduled -no  Last refill: -6 months  Notes to clinic: Patient has not seen any other current provider at office- sent for review and RF  Requested Prescriptions  Pending Prescriptions Disp Refills   omeprazole (PRILOSEC) 20 MG capsule 90 capsule 1    Sig: Take 1 capsule (20 mg total) by mouth daily.      Gastroenterology: Proton Pump Inhibitors Passed - 02/21/2020 10:47 AM      Passed - Valid encounter within last 12 months    Recent Outpatient Visits           6 months ago Encounter for Medicare annual wellness exam   Primary Care at [facility], Lilia Argue, MD   6 months ago Screening for prostate cancer   Primary Care at [facility], Lilia Argue, MD   1 year ago Essential hypertension, benign   Primary Care at [facility], Lilia Argue, MD   1 year ago Dizziness   Primary Care at [facility], Lilia Argue, MD   2 years ago [facility] to [facility] preventive visit   [facility] at [facility], Lilia Argue, MD       Future Appointments             In 2 months [place], [place], [place] [facility], [facility]                 Requested Prescriptions  Pending Prescriptions Disp Refills   omeprazole (PRILOSEC) 20 MG capsule 90 capsule 1    Sig: Take 1 capsule (20 mg total) by mouth daily.      Gastroenterology: Proton Pump Inhibitors Passed - 02/21/2020 10:47 AM      Passed - Valid encounter within last 12 months    Recent Outpatient Visits           6 months ago Encounter for Medicare annual wellness exam   Primary Care at [facility], Lilia Argue, MD   6 months ago Screening for prostate cancer   Primary Care at [facility], Lilia Argue, MD   1 year ago Essential hypertension, benign   Primary Care at [facility], Lilia Argue, MD   1 year ago Dizziness   Primary Care at [facility], Lilia Argue, MD   2 years ago [facility] to [facility] preventive visit   Primary Care at [facility], Lilia Argue, MD       Future Appointments             In 2 months [place], Ronalee Red, [place] [facility], [place]

## 2020-02-24 ENCOUNTER — Other Ambulatory Visit: Payer: Self-pay | Admitting: Emergency Medicine

## 2020-02-24 DIAGNOSIS — K219 Gastro-esophageal reflux disease without esophagitis: Secondary | ICD-10-CM

## 2020-02-24 MED ORDER — OMEPRAZOLE 20 MG PO CPDR: 20.0000 mg | DELAYED_RELEASE_CAPSULE | Freq: Every day | ORAL | 0 refills | Status: DC

## 2020-02-24 NOTE — Telephone Encounter (Addendum)
[facility] and [facility] not receive the  omeprazole (PRILOSEC) 20 MG capsule 90 days That was sent 02/21/20.  Rx states it was "printed"  Can you resend?  [id number] - [place], [place] - [address]

## 2020-02-28 ENCOUNTER — Telehealth: Payer: Self-pay | Admitting: Emergency Medicine

## 2020-02-28 ENCOUNTER — Ambulatory Visit: Payer: Self-pay | Admitting: *Deleted

## 2020-02-28 ENCOUNTER — Other Ambulatory Visit: Payer: Self-pay

## 2020-02-28 DIAGNOSIS — K219 Gastro-esophageal reflux disease without esophagitis: Secondary | ICD-10-CM

## 2020-02-28 NOTE — Telephone Encounter (Signed)
Pt calling in to follow up on the omeprazole Rx.   It looks like it has been approved twice however for some reason the pharmacy does not have a record of it.   I was unable to locate the problem. I did not speak with the pt.   I requested agent transfer it to [facility] [address] for follow up on their end.

## 2020-02-28 NOTE — Telephone Encounter (Signed)
Patient called in the Rx was not received on 02/24/20. Per snap shot it was printed. Please advise

## 2020-02-28 NOTE — Telephone Encounter (Signed)
Patient is needing a courtesy refill on  omeprazole (PRILOSEC) 20 MG capsule  To  [id number] - [place], [place] - [address]  [address] [place], [place] [address]  Phone:  [phone] Fax:  [phone]   Patient has called on this twice before and that we say we are sending it but not going through to the [facility]

## 2020-02-28 NOTE — Telephone Encounter (Signed)
02/28/2020 - PATIENT HAS CALLED BACK REGARDING THIS PRESCRIPTION. HE WANTS [place] [facility] [place] TO BE SURE THIS GETS FILLED DUE TO HIM CALLING SEVERAL TIMES AND IT STILL HAS NOT BEEN DONE. HE SAID THE PRESCRIPTION # IS: [id number]. PLEASE CALL HIM WHEN IT HAS BEEN DONE. [place]

## 2020-02-28 NOTE — Telephone Encounter (Signed)
Spoke with Tye [place] the Pharmacist. They did not have the order from 02/24/2020 gave her the verbal for them and pt can pick up in a little while

## 2020-02-28 NOTE — Telephone Encounter (Signed)
Prescription was sent on 2-3

## 2020-03-06 DIAGNOSIS — I10 Essential (primary) hypertension: Secondary | ICD-10-CM | POA: Diagnosis not present

## 2020-03-06 DIAGNOSIS — E785 Hyperlipidemia, unspecified: Secondary | ICD-10-CM | POA: Diagnosis not present

## 2020-03-06 DIAGNOSIS — K219 Gastro-esophageal reflux disease without esophagitis: Secondary | ICD-10-CM | POA: Diagnosis not present

## 2020-04-05 ENCOUNTER — Other Ambulatory Visit: Payer: Self-pay | Admitting: Family Medicine

## 2020-04-05 DIAGNOSIS — I10 Essential (primary) hypertension: Secondary | ICD-10-CM

## 2020-04-05 MED ORDER — AMLODIPINE BESYLATE 5 MG PO TABS: 5.0000 mg | ORAL_TABLET | Freq: Every day | ORAL | 0 refills | Status: DC

## 2020-04-05 NOTE — Telephone Encounter (Signed)
Medication: amLODipine (NORVASC) 5 MG tablet  30 day Has the pt contacted their pharmacy? Yes  Preferred pharmacy: [id number] - [place], [place] - [address]  Please be advised refills may take up to 3 business days.  We ask that you follow up with your pharmacy.  Pt lives in [place], and will need appt there.  Please call pt to set up. Pt says he will call back within 30 days to make appt with [facility]

## 2020-04-05 NOTE — Telephone Encounter (Signed)
   Notes to clinic:  Medication filled by Dr. Pamella Pert  Review for refill  Patient will need to schedule at office in [facility]   Requested Prescriptions  Pending Prescriptions Disp Refills   amLODipine (NORVASC) 5 MG tablet 90 tablet 1    Sig: Take 1 tablet (5 mg total) by mouth daily.      There is no refill protocol information for this order

## 2020-04-06 ENCOUNTER — Ambulatory Visit: Payer: Self-pay | Admitting: *Deleted

## 2020-04-06 DIAGNOSIS — R059 Cough, unspecified: Secondary | ICD-10-CM | POA: Diagnosis not present

## 2020-04-06 DIAGNOSIS — I10 Essential (primary) hypertension: Secondary | ICD-10-CM | POA: Diagnosis not present

## 2020-04-06 NOTE — Telephone Encounter (Signed)
Patient called to request assistance regarding amlodipine . Patient reported pharmacy reported medication was not available . [facility] spoke to pharmacist to review receipt confirmed by pharmacy on 04/05/20 at 1:22 pm. Pharmacist reported medication confirmation and pharmacy will contact patient that medication is ready for pick up. Patient reviewed encounter with pharmacist and to allow pharmacy time to contact patient and if no contact to notify medication was ready for pick up to call pharmacy and notify office for further assistance. Patient verbalized understanding .

## 2020-04-19 DIAGNOSIS — I10 Essential (primary) hypertension: Secondary | ICD-10-CM | POA: Diagnosis not present

## 2020-04-19 DIAGNOSIS — R053 Chronic cough: Secondary | ICD-10-CM | POA: Diagnosis not present

## 2020-04-19 DIAGNOSIS — Z0000 Encounter for general adult medical examination without abnormal findings: Secondary | ICD-10-CM | POA: Diagnosis not present

## 2020-04-19 DIAGNOSIS — E785 Hyperlipidemia, unspecified: Secondary | ICD-10-CM | POA: Diagnosis not present

## 2020-04-25 ENCOUNTER — Ambulatory Visit: Payer: Medicare Other | Admitting: Physician Assistant

## 2020-04-29 ENCOUNTER — Other Ambulatory Visit: Payer: Self-pay | Admitting: Family Medicine

## 2020-04-29 DIAGNOSIS — I10 Essential (primary) hypertension: Secondary | ICD-10-CM

## 2020-04-29 NOTE — Telephone Encounter (Signed)
[facility] has closed and pt has not established care with another provider. Sent message for pt to call [facility] if would like to set up care with Dr Carlota Raspberry.

## 2020-05-01 DIAGNOSIS — I10 Essential (primary) hypertension: Secondary | ICD-10-CM | POA: Diagnosis not present

## 2020-05-01 DIAGNOSIS — Z0000 Encounter for general adult medical examination without abnormal findings: Secondary | ICD-10-CM | POA: Diagnosis not present

## 2020-05-01 DIAGNOSIS — E785 Hyperlipidemia, unspecified: Secondary | ICD-10-CM | POA: Diagnosis not present

## 2020-05-01 DIAGNOSIS — R053 Chronic cough: Secondary | ICD-10-CM | POA: Diagnosis not present

## 2020-05-29 DIAGNOSIS — Z1152 Encounter for screening for COVID-19: Secondary | ICD-10-CM | POA: Diagnosis not present

## 2020-06-12 DIAGNOSIS — R0600 Dyspnea, unspecified: Secondary | ICD-10-CM | POA: Diagnosis not present

## 2020-06-12 DIAGNOSIS — R0789 Other chest pain: Secondary | ICD-10-CM | POA: Diagnosis not present

## 2020-06-12 DIAGNOSIS — Z8616 Personal history of COVID-19: Secondary | ICD-10-CM | POA: Diagnosis not present

## 2020-06-12 DIAGNOSIS — I10 Essential (primary) hypertension: Secondary | ICD-10-CM | POA: Diagnosis not present

## 2020-06-12 DIAGNOSIS — L700 Acne vulgaris: Secondary | ICD-10-CM | POA: Diagnosis not present

## 2020-06-12 DIAGNOSIS — M79672 Pain in left foot: Secondary | ICD-10-CM | POA: Diagnosis not present

## 2020-06-12 DIAGNOSIS — R059 Cough, unspecified: Secondary | ICD-10-CM | POA: Diagnosis not present

## 2020-06-14 ENCOUNTER — Encounter: Payer: Self-pay | Admitting: Physician Assistant

## 2020-06-14 ENCOUNTER — Ambulatory Visit: Payer: Medicare Other | Admitting: Physician Assistant

## 2020-06-14 ENCOUNTER — Other Ambulatory Visit: Payer: Self-pay

## 2020-06-14 DIAGNOSIS — L820 Inflamed seborrheic keratosis: Secondary | ICD-10-CM

## 2020-06-14 DIAGNOSIS — D1801 Hemangioma of skin and subcutaneous tissue: Secondary | ICD-10-CM

## 2020-06-14 DIAGNOSIS — Z85828 Personal history of other malignant neoplasm of skin: Secondary | ICD-10-CM

## 2020-06-14 DIAGNOSIS — Z86018 Personal history of other benign neoplasm: Secondary | ICD-10-CM | POA: Diagnosis not present

## 2020-06-14 DIAGNOSIS — Z1283 Encounter for screening for malignant neoplasm of skin: Secondary | ICD-10-CM | POA: Diagnosis not present

## 2020-06-14 DIAGNOSIS — L814 Other melanin hyperpigmentation: Secondary | ICD-10-CM | POA: Diagnosis not present

## 2020-06-14 DIAGNOSIS — L578 Other skin changes due to chronic exposure to nonionizing radiation: Secondary | ICD-10-CM | POA: Diagnosis not present

## 2020-06-14 DIAGNOSIS — L570 Actinic keratosis: Secondary | ICD-10-CM | POA: Diagnosis not present

## 2020-06-14 NOTE — Progress Notes (Addendum)
   Follow-Up Visit   Subjective  Nathaniel Bender is a 70 y.o. male who presents for the following: Annual Exam (Right clavicle, right temple, lower right lid and left forearm check lesions new to patient x months. Patient also has a doxycycline for the scalp bumps his primary care Dr gave him second opinion on this he also used peppermint shampoo Patient has history of severe atypia and scc).   The following portions of the chart were reviewed this encounter and updated as appropriate:  Tobacco  Allergies  Meds  Problems  Med Hx  Surg Hx  Fam Hx      Objective  Well appearing patient in no apparent distress; mood and affect are within normal limits.  A full examination was performed including scalp, head, eyes, ears, nose, lips, neck, chest, axillae, abdomen, back, buttocks, bilateral upper extremities, bilateral lower extremities, hands, feet, fingers, toes, fingernails, and toenails. All findings within normal limits unless otherwise noted below.  Objective  Left Shoulder - Anterior, Left Supraclavicular Area (4), Right Supraclavicular Area (4): Erythematous stuck-on, plaques.   Objective  Left Forehead, Left Temporal Scalp, Mid Tip of Nose, Right Temporal Scalp: Erythematous patches with gritty scale.Erythematous patches with gritty scale.  Assessment & Plan  Inflamed seborrheic keratosis (9) Left Shoulder - Anterior; Left Supraclavicular Area (4); Right Supraclavicular Area (4)  Destruction of lesion - Left Shoulder - Anterior, Left Supraclavicular Area, Right Supraclavicular Area Complexity: simple   Destruction method: cryotherapy   Informed consent: discussed and consent obtained   Timeout:  patient name, date of birth, surgical site, and procedure verified Lesion destroyed using liquid nitrogen: Yes   Cryotherapy cycles:  3 Outcome: patient tolerated procedure well with no complications   Post-procedure details: wound care instructions given    AK (actinic keratosis)  (4) Mid Tip of Nose; Left Forehead; Left Temporal Scalp; Right Temporal Scalp  Destruction of lesion - Left Forehead, Left Temporal Scalp, Mid Tip of Nose, Right Temporal Scalp Complexity: simple   Destruction method: cryotherapy   Informed consent: discussed and consent obtained   Timeout:  patient name, date of birth, surgical site, and procedure verified Lesion destroyed using liquid nitrogen: Yes   Cryotherapy cycles:  3 Outcome: patient tolerated procedure well with no complications   Post-procedure details: wound care instructions given   Lentigines - Scattered tan macules - Discussed due to sun exposure - Benign, observe - Call for any changes  Hemangiomas - Red papules - Discussed benign nature - Observe - Call for any changes  Actinic Damage- will use [place]ak in the fall - diffuse scaly erythematous macules with underlying dyspigmentation - Recommend daily broad spectrum sunscreen SPF 30+ to sun-exposed areas, reapply every 2 hours as needed.  - Call for new or changing lesions.  Skin cancer screening performed today. All scars clear. No atypical nevi noted.   I, Jontez Redfield, PA-C, have reviewed all documentation's for this visit.  The documentation on 06/15/20 for the exam, diagnosis, procedures and orders are all accurate and complete.

## 2020-09-26 ENCOUNTER — Other Ambulatory Visit: Payer: Self-pay | Admitting: Emergency Medicine

## 2020-09-26 ENCOUNTER — Other Ambulatory Visit: Payer: Self-pay | Admitting: Family Medicine

## 2020-09-26 DIAGNOSIS — K219 Gastro-esophageal reflux disease without esophagitis: Secondary | ICD-10-CM

## 2020-09-26 DIAGNOSIS — I10 Essential (primary) hypertension: Secondary | ICD-10-CM

## 2020-11-21 ENCOUNTER — Other Ambulatory Visit: Payer: Self-pay

## 2020-11-21 ENCOUNTER — Encounter: Payer: Self-pay | Admitting: Physician Assistant

## 2020-11-21 ENCOUNTER — Ambulatory Visit: Payer: Medicare Other | Admitting: Physician Assistant

## 2020-11-21 DIAGNOSIS — L430 Hypertrophic lichen planus: Secondary | ICD-10-CM | POA: Diagnosis not present

## 2020-11-21 DIAGNOSIS — D485 Neoplasm of uncertain behavior of skin: Secondary | ICD-10-CM

## 2020-11-21 DIAGNOSIS — L820 Inflamed seborrheic keratosis: Secondary | ICD-10-CM | POA: Diagnosis not present

## 2020-11-21 DIAGNOSIS — L570 Actinic keratosis: Secondary | ICD-10-CM

## 2020-11-21 MED ORDER — TOLAK 4 % EX CREA
1.0000 | TOPICAL_CREAM | Freq: Every day | CUTANEOUS | 1 refills | Status: DC
Start: 2020-11-21 — End: 2020-11-21

## 2020-11-21 MED ORDER — KLISYRI 1 % EX OINT: 1.0000 "application " | TOPICAL_OINTMENT | Freq: Every day | CUTANEOUS | 0 refills | Status: DC

## 2020-11-21 NOTE — Patient Instructions (Signed)

## 2020-11-22 ENCOUNTER — Encounter: Payer: Self-pay | Admitting: Physician Assistant

## 2020-11-22 NOTE — Progress Notes (Signed)
   Follow-Up Visit   Subjective  Nathaniel Bender is a 70 y.o. male who presents for the following: Follow-up (Right under eye ln2 previous with q-tip and lesion is still present and may have 2 more new ones ).   The following portions of the chart were reviewed this encounter and updated as appropriate:  Tobacco  Allergies  Meds  Problems  Med Hx  Surg Hx  Fam Hx      Objective  Well appearing patient in no apparent distress; mood and affect are within normal limits.  A focused examination was performed including face. Relevant physical exam findings are noted in the Assessment and Plan.  Right Malar Cheek Hyperkeratotic scale with pink base        Right Temple Hyperkeratotic scale with pink base        Head - Anterior (Face) Erythematous patches with gritty scale.   Assessment & Plan  Neoplasm of uncertain behavior of skin (2) Right Malar Cheek  Skin / nail biopsy Type of biopsy: tangential   Informed consent: discussed and consent obtained   Timeout: patient name, date of birth, surgical site, and procedure verified   Anesthesia: the lesion was anesthetized in a standard fashion   Anesthetic:  1% lidocaine w/ epinephrine 1-100,000 local infiltration Instrument used: flexible razor blade   Hemostasis achieved with: ferric subsulfate   Outcome: patient tolerated procedure well   Post-procedure details: wound care instructions given    Destruction of lesion Complexity: simple   Destruction method: electrodesiccation and curettage   Informed consent: discussed and consent obtained   Timeout:  patient name, date of birth, surgical site, and procedure verified Anesthesia: the lesion was anesthetized in a standard fashion   Anesthetic:  1% lidocaine w/ epinephrine 1-100,000 local infiltration Curettage performed in three different directions: Yes   Curettage cycles:  3 Margin per side (cm):  0.1 Final wound size (cm):  1 Hemostasis achieved with:  aluminum  chloride Outcome: patient tolerated procedure well with no complications   Post-procedure details: wound care instructions given    Specimen 1 - Surgical pathology Differential Diagnosis: scc vs bcc  Check Margins: No  Right Temple  Skin / nail biopsy Type of biopsy: tangential   Informed consent: discussed and consent obtained   Timeout: patient name, date of birth, surgical site, and procedure verified   Anesthesia: the lesion was anesthetized in a standard fashion   Anesthetic:  1% lidocaine w/ epinephrine 1-100,000 local infiltration Instrument used: flexible razor blade   Hemostasis achieved with: ferric subsulfate   Outcome: patient tolerated procedure well   Post-procedure details: wound care instructions given    Specimen 2 - Surgical pathology Differential Diagnosis: scc vs bcc  Check Margins: No  AK (actinic keratosis) Head - Anterior (Face)  Klysiri samples   Tirbanibulin (KLISYRI) 1 % OINT - Head - Anterior (Face) Apply 1 application topically at bedtime.    I, Calvert Charland, PA-C, have reviewed all documentation's for this visit.  The documentation on 11/22/20 for the exam, diagnosis, procedures and orders are all accurate and complete.

## 2020-12-20 ENCOUNTER — Encounter: Payer: Self-pay | Admitting: Podiatry

## 2020-12-20 ENCOUNTER — Other Ambulatory Visit: Payer: Self-pay

## 2020-12-20 ENCOUNTER — Ambulatory Visit (INDEPENDENT_AMBULATORY_CARE_PROVIDER_SITE_OTHER): Payer: Medicare Other

## 2020-12-20 ENCOUNTER — Ambulatory Visit: Payer: Medicare Other | Admitting: Podiatry

## 2020-12-20 DIAGNOSIS — M778 Other enthesopathies, not elsewhere classified: Secondary | ICD-10-CM

## 2020-12-20 NOTE — Progress Notes (Signed)
  Subjective:  Patient ID: Nathaniel Bender, male    DOB: 20-Sep-1950,   MRN: [id number]  Chief Complaint  Patient presents with   Foot Pain    My injury was walking with Dr Audie Clear pads and that was about a month or so ago and they feel like a pencil is in my socks    70 y.o. male presents for bilateral ball of the foot pain that has been going on for one month. Relates he may have injured it walking at that time. He recently went on a long hike. Has tried rest ice and ibuprofen with little relief. Relates the bottom of the foot is tender and feels like he has a "pencil" in the ball of his foot. Denies diabetes . Denies any other pedal complaints. Denies n/v/f/c.   Past Medical History:  Diagnosis Date   Arthritis    Degenerative Disc Disease Cervical; s/p injection [facility].   Atypical mole 05/04/2013   Right Wrist-Severe   Cancer ([place]) 07/21/2013   Skin cancer unknown type   Colon polyps    GERD (gastroesophageal reflux disease)    Gout    Hyperlipidemia    Hypertension    Meniere's disease of left ear    s/p ENT evaluation; acute hearing loss; chronic tinnitus L now.  Recovered hearing completely.   SCCA (squamous cell carcinoma) of skin 08/08/2014   Left Forehead-Well Diff (curet and 5FU)    Objective:  Physical Exam: Vascular: DP/PT pulses 2/4 bilateral. CFT <3 seconds. Normal hair growth on digits. No edema.  Skin. No lacerations or abrasions bilateral feet.  Musculoskeletal: MMT 5/5 bilateral lower extremities in DF, PF, Inversion and Eversion. Deceased ROM in DF of ankle joint. Tender to second metatarsophalangeal joint plantarly L>R. Pain with plantar flexion of the second digit.  Neurological: Sensation intact to light touch.   Assessment:   1. Capsulitis of foot, unspecified laterality      Plan:  Patient was evaluated and treated and all questions answered. Discussed capsulitis and inflammation of joint and treatment options with patient.  Radiographs  reviewed and discussed with patient.  No injection recommended today.  Discussed stiff sole shoes and recommend use of metatarsal pads.  Discussed taping and demonstrated taping of the toe.  Discussed if pain does not improve may consider PT and/or MRI for further surgical planning.  Patient to return as needed if pain continued or worsens. Lorenda Peck, DPM

## 2020-12-30 ENCOUNTER — Other Ambulatory Visit: Payer: Self-pay | Admitting: Emergency Medicine

## 2020-12-30 DIAGNOSIS — K219 Gastro-esophageal reflux disease without esophagitis: Secondary | ICD-10-CM

## 2021-03-22 ENCOUNTER — Ambulatory Visit: Payer: Medicare Other | Admitting: Podiatry

## 2021-03-27 ENCOUNTER — Ambulatory Visit: Payer: Medicare Other | Admitting: Podiatry

## 2021-03-27 ENCOUNTER — Other Ambulatory Visit: Payer: Self-pay

## 2021-03-27 ENCOUNTER — Encounter: Payer: Self-pay | Admitting: Podiatry

## 2021-03-27 DIAGNOSIS — M778 Other enthesopathies, not elsewhere classified: Secondary | ICD-10-CM

## 2021-03-27 MED ORDER — TRIAMCINOLONE ACETONIDE 40 MG/ML IJ SUSP
40.0000 mg | Freq: Once | INTRAMUSCULAR | Status: AC
  Administered 2021-03-27: 40 mg

## 2021-03-27 NOTE — Progress Notes (Signed)
He presents today chief concern of capsulitis he saw Dr. Blenda Mounts just the other week and states that I am really not doing them much better. ? ?Objective: Vital signs stable alert and oriented x3 he does have strong palpable pulses with pain on palpation to the second metatarsal phalangeal joints bilateral. ? ?Assessment: Painful capsulitis second bilateral. ? ?Plan: I injected around the joint today with 10 mg Kenalog comedos Marcaine he will continue use the pads that Dr. Blenda Mounts prescribed. ?

## 2021-04-05 ENCOUNTER — Ambulatory Visit: Payer: Medicare Other | Admitting: Pulmonary Disease

## 2021-04-05 ENCOUNTER — Encounter: Payer: Self-pay | Admitting: Pulmonary Disease

## 2021-04-05 ENCOUNTER — Ambulatory Visit (INDEPENDENT_AMBULATORY_CARE_PROVIDER_SITE_OTHER): Payer: Medicare Other

## 2021-04-05 ENCOUNTER — Other Ambulatory Visit: Payer: Self-pay

## 2021-04-05 VITALS — BP 122/76 | HR 52 | Temp 98.6°F | Ht 66.0 in | Wt 165.5 lb

## 2021-04-05 DIAGNOSIS — J9809 Other diseases of bronchus, not elsewhere classified: Secondary | ICD-10-CM

## 2021-04-05 DIAGNOSIS — R0609 Other forms of dyspnea: Secondary | ICD-10-CM

## 2021-04-05 DIAGNOSIS — R059 Cough, unspecified: Secondary | ICD-10-CM | POA: Diagnosis not present

## 2021-04-05 MED ORDER — PREDNISONE 10 MG PO TABS
10.0000 mg | ORAL_TABLET | Freq: Every day | ORAL | 0 refills | Status: DC
Start: 2021-04-05 — End: 2021-05-09

## 2021-04-05 MED ORDER — AMOXICILLIN-POT CLAVULANATE 875-125 MG PO TABS: 1.0000 | ORAL_TABLET | Freq: Two times a day (BID) | ORAL | 0 refills | Status: DC

## 2021-04-05 NOTE — Assessment & Plan Note (Addendum)
Appears to be longstanding x 5 months. ?Chest x-ray independently reviewed does not show any infiltrates or evidence of ILD. ?Once his acute symptoms are resolved we can proceed with PFTs to look for airway obstruction and to see if he will benefit from a steroid/LABA inhaler for asthma ?-Does not appear to be cardiac etiology, does not report angina or equivalent symptoms ?

## 2021-04-05 NOTE — Patient Instructions (Addendum)
?  X RX for Augmentin 875 bid x 7 days ? ?X Prednisone 10 mg tabs  Take 2 tabs daily with food x 5ds, then 1 tab daily with food x 5ds then STOP ? ?X CXR today for cough ? ?Try [facility] sinus PE - chlorpheniramine + PE combination ? ?X Ambulatory sat ? ? ?

## 2021-04-05 NOTE — Progress Notes (Signed)
? ?Subjective:  ? ? Patient ID: Nathaniel Bender, male    DOB: 1950-11-28, 71 y.o.   MRN: [id number] ? ?HPI ? ?Chief Complaint  ?Patient presents with  ? Consult  ?  Consult for weakness noted in lungs. Pt states he has noted it for 3 months. Walking and he notes out of breath. He does have cough noted. No illness noted before symptoms started per patient   ? ? ?71 year old never smoker presents for evaluation of shortness of breath and cough. ? ?-Shortness of breath has been ongoing for about 5 months, he walks his 2 dogs about a mile every day and feels short of breath when walking uphill ?-He has a URI at least once every winter, it occurred in December 2022 and required a course of prednisone he was then given Wixela powder inhaler for 1 month which made him feel better ?-He reports an acute cough for the last 2 to 3 weeks, his wife was sick around the same time and tested negative for COVID, she received an Augmentin and prednisone prescription from her PCP, cough is on a deep throat and cough, with minimal green sputum production.  He has tried Mucinex and Alka-Seltzer over-the-counter without much relief. ? ?Chest x-ray 05/2020 was reviewed which is clear of infiltrates. ? ?He has been taking Claritin and Singulair since January ? ?Environment -has 2 horses and 2 dogs on the farm, 2 dogs and a cat at home  ?-Exposure to hay ? ?Work for [employer] before he retired last year ? ? ?Past Medical History:  ?Diagnosis Date  ? Arthritis   ? Degenerative Disc Disease Cervical; s/p injection [facility].  ? Atypical mole 05/04/2013  ? Right Wrist-Severe  ? Cancer ([place]) 07/21/2013  ? Skin cancer unknown type  ? Colon polyps   ? GERD (gastroesophageal reflux disease)   ? Gout   ? Hyperlipidemia   ? Hypertension   ? Meniere's disease of left ear   ? s/p ENT evaluation; acute hearing loss; chronic tinnitus L now.  Recovered hearing completely.  ? SCCA (squamous cell carcinoma) of skin 08/08/2014  ?  Left Forehead-Well Diff (curet and 5FU)  ? ?Past Surgical History:  ?Procedure Laterality Date  ? ROTATOR CUFF REPAIR Right   ? TONSILLECTOMY    ? ? ?No Known Allergies ? ?Social History  ? ?Socioeconomic History  ? Marital status: Married  ?  Spouse name: Not on file  ? Number of children: Not on file  ? Years of education: Not on file  ? Highest education level: Not on file  ?Occupational History  ? Occupation: Sales  ?Tobacco Use  ? Smoking status: Never  ? Smokeless tobacco: Former  ?Vaping Use  ? Vaping Use: Never used  ?Substance and Sexual Activity  ? Alcohol use: Yes  ?  Alcohol/week: 20.0 standard drinks  ?  Types: 10 Glasses of wine, 10 Standard drinks or equivalent per week  ? Drug use: No  ? Sexual activity: Yes  ?Other Topics Concern  ? Not on file  ?Social History Narrative  ? Marital:  Married. X 26 years.    ?    Children: none  ?    Lives: with wife  ?     Education: College/Other. Exercise: Yes.  ?    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  ?    Tobacco: never  ?  Alcohol:  2-3 per day; more on weekends; wine.  ?    Exercise:  Walking 2 days per week; 2-4 miles.  ?    Seatbelt:  100%  ?    Guns: 12 gauge shot gun.  ?    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    ? ?Social Determinants of Health  ? ?Financial Resource Strain: Not on file  ?Food Insecurity: Not on file  ?Transportation Needs: Not on file  ?Physical Activity: Not on file  ?Stress: Not on file  ?Social Connections: Not on file  ?Intimate Partner Violence: Not on file  ? ? ? ?Family History  ?Problem Relation Age of Onset  ? Hyperlipidemia Mother   ? Hypertension Mother   ? Arthritis Mother 52  ?     DDD lumbar spine  ? Diabetes Father   ? Heart disease Father 58  ?     CAD/CABG  ? Hyperlipidemia Father   ? Hypertension Father   ? Hyperlipidemia Sister   ? Hypertension Sister   ? Heart disease Maternal Grandfather   ?  Mental illness Paternal Grandmother   ? Stroke Paternal Grandfather   ? ? ? ? ?Review of Systems ?Shortness of breath with activity ?Nonproductive cough ?Weight loss 5 pounds ? ? ?Constitutional: negative for anorexia, fevers and sweats  ?Eyes: negative for irritation, redness and visual disturbance  ?Ears, nose, mouth, throat, and face: negative for earaches, epistaxis, nasal congestion and sore throat  ? ?Cardiovascular: negative for chest pain, dyspnea, lower extremity edema, orthopnea, palpitations and syncope  ?Gastrointestinal: negative for abdominal pain, constipation, diarrhea, melena, nausea and vomiting  ?Genitourinary:negative for dysuria, frequency and hematuria  ?Hematologic/lymphatic: negative for bleeding, easy bruising and lymphadenopathy  ?Musculoskeletal:negative for arthralgias, muscle weakness and stiff joints  ?Neurological: negative for coordination problems, gait problems, headaches and weakness  ?Endocrine: negative for diabetic symptoms including polydipsia, polyuria and weight loss ? ?   ?Objective:  ? Physical Exam ? ?Gen. Pleasant, well-nourished, in no distress, normal affect ?ENT - no pallor,icterus, no post nasal drip ?Neck: No JVD, no thyromegaly, no carotid bruits ?Lungs: no use of accessory muscles, no dullness to percussion, clear without rales or rhonchi  ?Cardiovascular: Rhythm regular, heart sounds  normal, no murmurs or gallops, no peripheral edema ?Abdomen: soft and non-tender, no hepatosplenomegaly, BS normal. ?Musculoskeletal: No deformities, no cyanosis or clubbing ?Neuro:  alert, non focal ? ? ? ?   ?Assessment & Plan:  ? ? ?Acute cough could be allergic or post bronchitic -since he has green sputum production, will treat with a course of Augmentin for 7 days ?-We will also treat with a short course of prednisone for 10 days ?-He will continue Claritin and Singulair ? ?

## 2021-05-03 ENCOUNTER — Ambulatory Visit: Payer: Medicare Other | Admitting: Podiatry

## 2021-05-03 ENCOUNTER — Encounter: Payer: Self-pay | Admitting: Podiatry

## 2021-05-03 DIAGNOSIS — M778 Other enthesopathies, not elsewhere classified: Secondary | ICD-10-CM

## 2021-05-03 MED ORDER — TRIAMCINOLONE ACETONIDE 40 MG/ML IJ SUSP
20.0000 mg | Freq: Once | INTRAMUSCULAR | Status: AC
  Administered 2021-05-03: 20 mg

## 2021-05-04 ENCOUNTER — Ambulatory Visit: Payer: Medicare Other

## 2021-05-04 DIAGNOSIS — M778 Other enthesopathies, not elsewhere classified: Secondary | ICD-10-CM

## 2021-05-04 NOTE — Progress Notes (Signed)
SITUATION ?Reason for Consult: Evaluation for Bilateral Custom Foot Orthoses ?Patient / Caregiver Report: Patient is ready for foot orthotics ? ?OBJECTIVE DATA: ?Patient History / Diagnosis:  ?  ICD-10-CM   ?1. Capsulitis of foot, unspecified laterality  M77.8   ?  ? ? ?Current or Previous Devices:   None and no history ? ?Foot Examination: ?Skin presentation:   Small wounds on left midfoot ?Ulcers & Callousing:   None and no hsitory ?Toe / Foot Deformities:  None ?Weight Bearing Presentation:  Rectus ?Sensation:    Intact ? ?Shoe Size:    28M ? ?ORTHOTIC RECOMMENDATION ?Recommended Device: 1x pair of custom functional foot orthotics ? ?GOALS OF ORTHOSES ?- Reduce Pain ?- Prevent Foot Deformity ?- Prevent Progression of Further Foot Deformity ?- Relieve Pressure ?- Improve the Overall Biomechanical Function of the Foot and Lower Extremity. ? ?ACTIONS PERFORMED ?Potential out of pocket cost was communicated to patient. Patient understood and consent to casting. Patient was casted for Foot Orthoses via crush box. Procedure was explained and patient tolerated procedure well. Casts were shipped to central fabrication. All questions were answered and concerns addressed. ? ?PLAN ?Patient is to be called for fitting when devices are ready.  ? ? ?

## 2021-05-06 ENCOUNTER — Emergency Department (HOSPITAL_BASED_OUTPATIENT_CLINIC_OR_DEPARTMENT_OTHER): Payer: Medicare Other | Admitting: Radiology

## 2021-05-06 ENCOUNTER — Encounter (HOSPITAL_BASED_OUTPATIENT_CLINIC_OR_DEPARTMENT_OTHER): Payer: Self-pay | Admitting: Emergency Medicine

## 2021-05-06 ENCOUNTER — Inpatient Hospital Stay (HOSPITAL_BASED_OUTPATIENT_CLINIC_OR_DEPARTMENT_OTHER)
Admission: EM | Admit: 2021-05-06 | Discharge: 2021-05-13 | DRG: 233 | Disposition: A | Discharge status: Home / Self Care | Payer: Medicare Other | Attending: Thoracic Surgery (Cardiothoracic Vascular Surgery) | Admitting: Thoracic Surgery (Cardiothoracic Vascular Surgery)

## 2021-05-06 ENCOUNTER — Other Ambulatory Visit: Payer: Self-pay

## 2021-05-06 DIAGNOSIS — M1990 Unspecified osteoarthritis, unspecified site: Secondary | ICD-10-CM | POA: Diagnosis present

## 2021-05-06 DIAGNOSIS — Z8249 Family history of ischemic heart disease and other diseases of the circulatory system: Secondary | ICD-10-CM | POA: Diagnosis not present

## 2021-05-06 DIAGNOSIS — I480 Paroxysmal atrial fibrillation: Secondary | ICD-10-CM | POA: Diagnosis present

## 2021-05-06 DIAGNOSIS — R001 Bradycardia, unspecified: Secondary | ICD-10-CM | POA: Diagnosis present

## 2021-05-06 DIAGNOSIS — Z833 Family history of diabetes mellitus: Secondary | ICD-10-CM

## 2021-05-06 DIAGNOSIS — M5030 Other cervical disc degeneration, unspecified cervical region: Secondary | ICD-10-CM | POA: Diagnosis present

## 2021-05-06 DIAGNOSIS — R7989 Other specified abnormal findings of blood chemistry: Secondary | ICD-10-CM

## 2021-05-06 DIAGNOSIS — Z7952 Long term (current) use of systemic steroids: Secondary | ICD-10-CM

## 2021-05-06 DIAGNOSIS — E785 Hyperlipidemia, unspecified: Secondary | ICD-10-CM | POA: Diagnosis present

## 2021-05-06 DIAGNOSIS — J9601 Acute respiratory failure with hypoxia: Secondary | ICD-10-CM | POA: Diagnosis not present

## 2021-05-06 DIAGNOSIS — Z87891 Personal history of nicotine dependence: Secondary | ICD-10-CM | POA: Diagnosis not present

## 2021-05-06 DIAGNOSIS — Z83438 Family history of other disorder of lipoprotein metabolism and other lipidemia: Secondary | ICD-10-CM

## 2021-05-06 DIAGNOSIS — Z01810 Encounter for preprocedural cardiovascular examination: Secondary | ICD-10-CM | POA: Diagnosis not present

## 2021-05-06 DIAGNOSIS — H8109 Meniere's disease, unspecified ear: Secondary | ICD-10-CM | POA: Diagnosis present

## 2021-05-06 DIAGNOSIS — I25110 Atherosclerotic heart disease of native coronary artery with unstable angina pectoris: Secondary | ICD-10-CM | POA: Diagnosis present

## 2021-05-06 DIAGNOSIS — R7303 Prediabetes: Secondary | ICD-10-CM | POA: Diagnosis present

## 2021-05-06 DIAGNOSIS — R739 Hyperglycemia, unspecified: Secondary | ICD-10-CM

## 2021-05-06 DIAGNOSIS — K219 Gastro-esophageal reflux disease without esophagitis: Secondary | ICD-10-CM | POA: Diagnosis present

## 2021-05-06 DIAGNOSIS — I252 Old myocardial infarction: Secondary | ICD-10-CM | POA: Diagnosis present

## 2021-05-06 DIAGNOSIS — I214 Non-ST elevation (NSTEMI) myocardial infarction: Secondary | ICD-10-CM | POA: Diagnosis present

## 2021-05-06 DIAGNOSIS — Z8261 Family history of arthritis: Secondary | ICD-10-CM | POA: Diagnosis not present

## 2021-05-06 DIAGNOSIS — M109 Gout, unspecified: Secondary | ICD-10-CM | POA: Diagnosis present

## 2021-05-06 DIAGNOSIS — R778 Other specified abnormalities of plasma proteins: Secondary | ICD-10-CM | POA: Diagnosis not present

## 2021-05-06 DIAGNOSIS — E7800 Pure hypercholesterolemia, unspecified: Secondary | ICD-10-CM | POA: Diagnosis not present

## 2021-05-06 DIAGNOSIS — I248 Other forms of acute ischemic heart disease: Secondary | ICD-10-CM | POA: Diagnosis not present

## 2021-05-06 DIAGNOSIS — I2510 Atherosclerotic heart disease of native coronary artery without angina pectoris: Secondary | ICD-10-CM | POA: Diagnosis not present

## 2021-05-06 DIAGNOSIS — I10 Essential (primary) hypertension: Secondary | ICD-10-CM | POA: Diagnosis present

## 2021-05-06 DIAGNOSIS — Z79899 Other long term (current) drug therapy: Secondary | ICD-10-CM

## 2021-05-06 DIAGNOSIS — R079 Chest pain, unspecified: Secondary | ICD-10-CM

## 2021-05-06 DIAGNOSIS — Z823 Family history of stroke: Secondary | ICD-10-CM | POA: Diagnosis not present

## 2021-05-06 DIAGNOSIS — Z7982 Long term (current) use of aspirin: Secondary | ICD-10-CM

## 2021-05-06 DIAGNOSIS — D62 Acute posthemorrhagic anemia: Secondary | ICD-10-CM | POA: Diagnosis not present

## 2021-05-06 DIAGNOSIS — Z951 Presence of aortocoronary bypass graft: Secondary | ICD-10-CM

## 2021-05-06 DIAGNOSIS — Z85828 Personal history of other malignant neoplasm of skin: Secondary | ICD-10-CM

## 2021-05-06 DIAGNOSIS — E8770 Fluid overload, unspecified: Secondary | ICD-10-CM | POA: Diagnosis not present

## 2021-05-06 HISTORY — DX: Chest pain, unspecified: R07.9

## 2021-05-06 LAB — CBC
HCT: 47.7 % (ref 39.0–52.0)
HCT: 45.6 % (ref 39.0–52.0)
Hemoglobin: 16.2 g/dL (ref 13.0–17.0)
Hemoglobin: 15.5 g/dL (ref 13.0–17.0)
MCH: 29.3 pg (ref 26.0–34.0)
MCH: 30 pg (ref 26.0–34.0)
MCHC: 34 g/dL (ref 30.0–36.0)
MCHC: 34 g/dL (ref 30.0–36.0)
MCV: 86.3 fL (ref 80.0–100.0)
MCV: 88.2 fL (ref 80.0–100.0)
Platelets: 126 10*3/uL — ABNORMAL LOW (ref 150–400)
Platelets: 112 10*3/uL — ABNORMAL LOW (ref 150–400)
RBC: 5.53 MIL/uL (ref 4.22–5.81)
RBC: 5.17 MIL/uL (ref 4.22–5.81)
RDW: 13.2 % (ref 11.5–15.5)
RDW: 13 % (ref 11.5–15.5)
WBC: 6.2 10*3/uL (ref 4.0–10.5)
WBC: 6.7 10*3/uL (ref 4.0–10.5)
nRBC: 0 % (ref 0.0–0.2)
nRBC: 0 % (ref 0.0–0.2)

## 2021-05-06 LAB — COMPREHENSIVE METABOLIC PANEL
ALT: 21 U/L (ref 0–44)
AST: 21 U/L (ref 15–41)
Albumin: 5.2 g/dL — ABNORMAL HIGH (ref 3.5–5.0)
Alkaline Phosphatase: 77 U/L (ref 38–126)
Anion gap: 14 (ref 5–15)
BUN: 21 mg/dL (ref 8–23)
CO2: 23 mmol/L (ref 22–32)
Calcium: 10.3 mg/dL (ref 8.9–10.3)
Chloride: 102 mmol/L (ref 98–111)
Creatinine, Ser: 1.07 mg/dL (ref 0.61–1.24)
GFR, Estimated: >60 mL/min (ref >60)
Glucose, Bld: 114 mg/dL — ABNORMAL HIGH (ref 70–99)
Potassium: 3.5 mmol/L (ref 3.5–5.1)
Sodium: 139 mmol/L (ref 135–145)
Total Bilirubin: 0.8 mg/dL (ref 0.3–1.2)
Total Protein: 8.3 g/dL — ABNORMAL HIGH (ref 6.5–8.1)

## 2021-05-06 LAB — BASIC METABOLIC PANEL
Anion gap: 9 (ref 5–15)
BUN: 18 mg/dL (ref 8–23)
CO2: 24 mmol/L (ref 22–32)
Calcium: 9.1 mg/dL (ref 8.9–10.3)
Chloride: 106 mmol/L (ref 98–111)
Creatinine, Ser: 1.03 mg/dL (ref 0.61–1.24)
GFR, Estimated: >60 mL/min (ref >60)
Glucose, Bld: 126 mg/dL — ABNORMAL HIGH (ref 70–99)
Potassium: 3.8 mmol/L (ref 3.5–5.1)
Sodium: 139 mmol/L (ref 135–145)

## 2021-05-06 LAB — HEPARIN LEVEL (UNFRACTIONATED): Heparin Unfractionated: 0.45 IU/mL (ref 0.30–0.70)

## 2021-05-06 LAB — TROPONIN I (HIGH SENSITIVITY)
Troponin I (High Sensitivity): 31 ng/L — ABNORMAL HIGH (ref <18)
Troponin I (High Sensitivity): 224 ng/L (ref <18)

## 2021-05-06 LAB — PROTIME-INR
INR: 1 (ref 0.8–1.2)
Prothrombin Time: 13.2 seconds (ref 11.4–15.2)

## 2021-05-06 LAB — MRSA NEXT GEN BY PCR, NASAL: MRSA by PCR Next Gen: NOT DETECTED

## 2021-05-06 LAB — BRAIN NATRIURETIC PEPTIDE: B Natriuretic Peptide: 76.9 pg/mL (ref 0.0–100.0)

## 2021-05-06 LAB — HIV ANTIBODY (ROUTINE TESTING W REFLEX): HIV Screen 4th Generation wRfx: NONREACTIVE

## 2021-05-06 LAB — LIPID PANEL
Cholesterol: 178 mg/dL (ref 0–200)
HDL: 53 mg/dL (ref >40)
LDL Cholesterol: 95 mg/dL (ref 0–99)
Total CHOL/HDL Ratio: 3.4 RATIO
Triglycerides: 148 mg/dL (ref <150)
VLDL: 30 mg/dL (ref 0–40)

## 2021-05-06 LAB — MAGNESIUM: Magnesium: 1.9 mg/dL (ref 1.7–2.4)

## 2021-05-06 MED ORDER — NITROGLYCERIN 0.4 MG SL SUBL: 0.4000 mg | SUBLINGUAL_TABLET | SUBLINGUAL | Status: DC | PRN

## 2021-05-06 MED ORDER — AMLODIPINE BESYLATE 5 MG PO TABS
5.0000 mg | ORAL_TABLET | Freq: Every day | ORAL | Status: DC
  Administered 2021-05-06 – 2021-05-07 (×2): 5 mg via ORAL
  Filled 2021-05-06 (×2): qty 1

## 2021-05-06 MED ORDER — PANTOPRAZOLE SODIUM 40 MG PO TBEC
40.0000 mg | DELAYED_RELEASE_TABLET | Freq: Every day | ORAL | Status: DC
Start: 2021-05-07 — End: 2021-05-08
  Administered 2021-05-07: 40 mg via ORAL
  Filled 2021-05-06: qty 1

## 2021-05-06 MED ORDER — ASPIRIN EC 81 MG PO TBEC
81.0000 mg | DELAYED_RELEASE_TABLET | Freq: Every day | ORAL | Status: DC
  Administered 2021-05-07: 81 mg via ORAL
  Filled 2021-05-06: qty 1

## 2021-05-06 MED ORDER — ONDANSETRON HCL 4 MG/2ML IJ SOLN: 4.0000 mg | Freq: Four times a day (QID) | INTRAMUSCULAR | Status: DC | PRN

## 2021-05-06 MED ORDER — ALPRAZOLAM 0.25 MG PO TABS
0.2500 mg | ORAL_TABLET | Freq: Two times a day (BID) | ORAL | Status: DC | PRN
  Administered 2021-05-06: 0.25 mg via ORAL
  Filled 2021-05-06: qty 1

## 2021-05-06 MED ORDER — ACETAMINOPHEN 325 MG PO TABS: 650.0000 mg | ORAL_TABLET | ORAL | Status: DC | PRN

## 2021-05-06 MED ORDER — ASPIRIN 81 MG PO CHEW
324.0000 mg | CHEWABLE_TABLET | Freq: Once | ORAL | Status: AC
  Administered 2021-05-06: 324 mg via ORAL
  Filled 2021-05-06: qty 4

## 2021-05-06 MED ORDER — LOSARTAN POTASSIUM 50 MG PO TABS
50.0000 mg | ORAL_TABLET | Freq: Every day | ORAL | Status: DC
  Administered 2021-05-07: 50 mg via ORAL
  Filled 2021-05-06 (×2): qty 1

## 2021-05-06 MED ORDER — POTASSIUM CHLORIDE 20 MEQ PO PACK
20.0000 meq | PACK | Freq: Once | ORAL | Status: AC
  Administered 2021-05-06: 20 meq via ORAL
  Filled 2021-05-06: qty 1

## 2021-05-06 MED ORDER — MONTELUKAST SODIUM 10 MG PO TABS
10.0000 mg | ORAL_TABLET | Freq: Every day | ORAL | Status: DC
  Administered 2021-05-07: 10 mg via ORAL
  Filled 2021-05-06: qty 1

## 2021-05-06 MED ORDER — ATORVASTATIN CALCIUM 80 MG PO TABS
80.0000 mg | ORAL_TABLET | Freq: Every day | ORAL | Status: DC
  Administered 2021-05-06 – 2021-05-07 (×2): 80 mg via ORAL
  Filled 2021-05-06 (×2): qty 1

## 2021-05-06 MED ORDER — HEPARIN (PORCINE) 25000 UT/250ML-% IV SOLN
1000.0000 [IU]/h | INTRAVENOUS | Status: DC
  Administered 2021-05-06: 1000 [IU]/h via INTRAVENOUS
  Filled 2021-05-06: qty 250

## 2021-05-06 MED ORDER — OFF THE BEAT BOOK
Freq: Once | Status: AC
  Filled 2021-05-06: qty 1

## 2021-05-06 MED ORDER — HEPARIN BOLUS VIA INFUSION
4000.0000 [IU] | Freq: Once | INTRAVENOUS | Status: AC
  Administered 2021-05-06: 4000 [IU] via INTRAVENOUS

## 2021-05-06 NOTE — Progress Notes (Signed)
ANTICOAGULATION CONSULT NOTE  ? ?Pharmacy Consult for heparin ?Indication: chest pain/ACS ? ?No Known Allergies ? ?Patient Measurements: ?Height: '5\' 7"'$  (170.2 cm) ?Weight: 75.8 kg (167 lb 1.7 oz) ?IBW/kg (Calculated) : 66.1 ?Heparin Dosing Weight: 77 kg  ? ?Vital Signs: ?Temp: 98.1 ?F (36.7 ?C) (04/16 2024) ?Temp Source: Oral (04/16 2024) ?BP: 173/92 (04/16 2024) ?Pulse Rate: 43 (04/16 2024) ? ?Labs: ?Recent Labs  ?  05/06/21 ?1326 05/06/21 ?1518 05/06/21 ?2202 05/06/21 ?2210  ?HGB 16.2  --   --  15.5  ?HCT 47.7  --   --  45.6  ?PLT 126*  --   --  112*  ?HEPARINUNFRC  --   --  0.45  --   ?CREATININE 1.07  --   --  1.03  ?TROPONINIHS 31* 224*  --   --   ? ? ?Estimated Creatinine Clearance: 63.3 mL/min (by C-G formula based on SCr of 1.03 mg/dL). ? ?Medical History: ?Past Medical History:  ?Diagnosis Date  ? Arthritis   ? Degenerative Disc Disease Cervical; s/p injection [facility].  ? Atypical mole 05/04/2013  ? Right Wrist-Severe  ? Cancer ([place]) 07/21/2013  ? Skin cancer unknown type  ? Colon polyps   ? GERD (gastroesophageal reflux disease)   ? Gout   ? Hyperlipidemia   ? Hypertension   ? Meniere's disease of left ear   ? s/p ENT evaluation; acute hearing loss; chronic tinnitus L now.  Recovered hearing completely.  ? SCCA (squamous cell carcinoma) of skin 08/08/2014  ? Left Forehead-Well Diff (curet and 5FU)  ? ?Medications:  ?Medications Prior to Admission  ?Medication Sig Dispense Refill Last Dose  ? amLODipine (NORVASC) 5 MG tablet Take 1 tablet (5 mg total) by mouth daily. 30 tablet 0   ? amoxicillin-clavulanate (AUGMENTIN) 875-125 MG tablet Take 1 tablet by mouth 2 (two) times daily. 14 tablet 0   ? aspirin EC 81 MG tablet Take 81 mg by mouth daily.     ? atorvastatin (LIPITOR) 40 MG tablet Take 1 tablet (40 mg total) by mouth daily. 90 tablet 1   ? loratadine (CLARITIN) 10 MG tablet Take by mouth.     ? loratadine (CLARITIN) 10 MG tablet Take 1 tablet by mouth daily.     ? losartan (COZAAR) 50 MG tablet  Take 1 tablet by mouth daily.     ? montelukast (SINGULAIR) 10 MG tablet Take 10 mg by mouth daily.     ? omeprazole (PRILOSEC) 20 MG capsule Take 1 capsule (20 mg total) by mouth daily. 90 capsule 0   ? predniSONE (DELTASONE) 10 MG tablet Take 1 tablet (10 mg total) by mouth daily with breakfast. 20 tablet 0   ? tadalafil, PAH, (ADCIRCA) 20 MG tablet TAKE 1/2 TO 1 TABLET (10-'20MG'$  TOTAL) BY MOUTH EVERY OTHER DAY AS NEEDED FOR ERECTILE DYSFUNCTION 24 tablet 5   ? Turmeric 500 MG TABS Take by mouth.     ? ? ?Assessment: ?86 YOM who presented with chest pain and elevated troponin concerning for ACS. Pharmacy consulted to start IV heparin.  ? ?Heparin level therapeutic: 0.45, CBC stable, no infusion issues or s/sx of bleeding reported ? ?Goal of Therapy:  ?Heparin level 0.3-0.7 units/ml ?Monitor platelets by anticoagulation protocol: Yes ?  ?Plan:  ?-Continue heparin infusion at 1000 units/hr ?-Monitor daily Heparin Level, CBC and s/sx of bleeding  ? ?Georga Bora, PharmD ?Clinical Pharmacist ?05/06/2021 11:20 PM ?Please check AMION for all [facility] numbers ? ? ? ?

## 2021-05-06 NOTE — ED Notes (Signed)
CRITICAL VALUE STICKER ? ?CRITICAL VALUE:Troponin 224 ? ?RECEIVER (on-site recipient of call):Shawnie Pons, RN ? ?DATE & TIME NOTIFIED: 05-06-2021 1558 ? ?MESSENGER (representative from lab): ? ?MD NOTIFIED: Dr. Sherry Ruffing ? ?TIME OF NOTIFICATION:1558 ? ?RESPONSE: Heparin drip initiated. ? ?

## 2021-05-06 NOTE — H&P (Signed)
?Cardiology Admission History and Physical:  ? ?Patient ID: Nathaniel Bender ?MRN: [id number]; DOB: May 09, 1950  ? ?Admission date: 05/06/2021 ? ?PCP:  Bernerd Limbo, MD ?  ?[facility] Providers ?Cardiologist:  Buford Dresser, MD      ? ? ?Chief Complaint:  Chest pressure ? ?Patient Profile:  ? ?Nathaniel Bender is a 71 y.o. male with pmh sx for HTN, HLD, GERD, OA  who is being seen 05/06/2021 for the evaluation of NSTEMI and Afib RVR ? ?History of Present Illness:  ? ?Mr. Nathaniel Bender is a 71 y.o. male with pmh sx for HTN, HLD, GERD, OA  who is being seen 05/06/2021 for the evaluation of NSTEMI and Afib RVR. Patient states he was doing fine till this morning when he was doing some work in his garage and felt some pressure in his chest associated with SOB and palpitations. Had to take a break. Such similar incidents have happened before as well with exertion but this time it was more intense hence decided to go to the ED. He said that his father had an MI about his age but otherwise the patient himself has not had any history of cardiac disease. In the ED, A-fib intermittently with RVR. Troponin were elevated and uptrending to 200s; hence there was concern for NSTEMI and cardiology was called for admission. In the ED, he self converted to NSR and currently is in sinus bradycardia. Patient is currently resting comfortably and denies any complains. BP is on the higher side SBP 170s.  ? ? ?Past Medical History:  ?Diagnosis Date  ? Arthritis   ? Degenerative Disc Disease Cervical; s/p injection [facility].  ? Atypical mole 05/04/2013  ? Right Wrist-Severe  ? Cancer ([place]) 07/21/2013  ? Skin cancer unknown type  ? Colon polyps   ? GERD (gastroesophageal reflux disease)   ? Gout   ? Hyperlipidemia   ? Hypertension   ? Meniere's disease of left ear   ? s/p ENT evaluation; acute hearing loss; chronic tinnitus L now.  Recovered hearing completely.  ? SCCA (squamous cell carcinoma) of skin 08/08/2014  ? Left Forehead-Well Diff  (curet and 5FU)  ? ? ?Past Surgical History:  ?Procedure Laterality Date  ? ROTATOR CUFF REPAIR Right   ? TONSILLECTOMY    ?  ? ?Medications Prior to Admission: ?Prior to Admission medications   ?Medication Sig Start Date End Date Taking? Authorizing Provider  ?amLODipine (NORVASC) 5 MG tablet Take 1 tablet (5 mg total) by mouth daily. 04/05/20   Wendie Agreste, MD  ?amoxicillin-clavulanate (AUGMENTIN) 875-125 MG tablet Take 1 tablet by mouth 2 (two) times daily. 04/05/21   Rigoberto Noel, MD  ?aspirin EC 81 MG tablet Take 81 mg by mouth daily.    [provider]  ?atorvastatin (LIPITOR) 40 MG tablet Take 1 tablet (40 mg total) by mouth daily. 08/06/19   Daleen Squibb, MD  ?loratadine (CLARITIN) 10 MG tablet Take by mouth. 05/01/20   [provider]  ?loratadine (CLARITIN) 10 MG tablet Take 1 tablet by mouth daily. 04/03/21   [provider]  ?losartan (COZAAR) 50 MG tablet Take 1 tablet by mouth daily. 10/11/20   [provider]  ?montelukast (SINGULAIR) 10 MG tablet Take 10 mg by mouth daily. 02/28/21   [provider]  ?omeprazole (PRILOSEC) 20 MG capsule Take 1 capsule (20 mg total) by mouth daily. 02/24/20   Horald Pollen, MD  ?predniSONE (DELTASONE) 10 MG tablet Take 1 tablet (10 mg total)  by mouth daily with breakfast. 04/05/21   Rigoberto Noel, MD  ?tadalafil, PAH, (ADCIRCA) 20 MG tablet TAKE 1/2 TO 1 TABLET (10-'20MG'$  TOTAL) BY MOUTH EVERY OTHER DAY AS NEEDED FOR ERECTILE DYSFUNCTION 08/06/19   Jacelyn Pi, Lilia Argue, MD  ?Turmeric 500 MG TABS Take by mouth.    [provider]  ?  ? ?Allergies:   No Known Allergies ? ?Social History:   ?Social History  ? ?Socioeconomic History  ? Marital status: Married  ?  Spouse name: Not on file  ? Number of children: Not on file  ? Years of education: Not on file  ? Highest education level: Not on file  ?Occupational History  ? Occupation: Sales  ?Tobacco Use  ? Smoking status: Never  ? Smokeless tobacco: Former   ?Vaping Use  ? Vaping Use: Never used  ?Substance and Sexual Activity  ? Alcohol use: Yes  ?  Alcohol/week: 20.0 standard drinks  ?  Types: 10 Glasses of wine, 10 Standard drinks or equivalent per week  ? Drug use: No  ? Sexual activity: Yes  ?Other Topics Concern  ? Not on file  ?Social History Narrative  ? Marital:  Married. X 26 years.    ?    Children: none  ?    Lives: with wife  ?     Education: College/Other. Exercise: Yes.  ?    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  ?    Tobacco: never  ?    Alcohol:  2-3 per day; more on weekends; wine.  ?    Exercise:  Walking 2 days per week; 2-4 miles.  ?    Seatbelt:  100%  ?    Guns: 12 gauge shot gun.  ?    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    ? ?Social Determinants of Health  ? ?Financial Resource Strain: Not on file  ?Food Insecurity: Not on file  ?Transportation Needs: Not on file  ?Physical Activity: Not on file  ?Stress: Not on file  ?Social Connections: Not on file  ?Intimate Partner Violence: Not on file  ?  ?Family History:  ?The patient's family history includes Arthritis (age of onset: 56) in his mother; Diabetes in his father; Heart disease in his maternal grandfather; Heart disease (age of onset: 6) in his father; Hyperlipidemia in his father, mother, and sister; Hypertension in his father, mother, and sister; Mental illness in his paternal grandmother; Stroke in his paternal grandfather.   ? ?ROS:  ?Please see the history of present illness.  ?All other ROS reviewed and negative.    ? ?Physical Exam/Data:  ? ?Vitals:  ? 05/06/21 1627 05/06/21 1800 05/06/21 1930 05/06/21 2024  ?BP: (!) 169/78 (!) 168/91 (!) 166/98 (!) 173/92  ?Pulse: (!) 47 (!) 54 (!) 49 (!) 43  ?Resp: (!) '23 18 16 16  '$ ?Temp:    98.1 ?F (36.7 ?C)  ?TempSrc:    Oral  ?SpO2: 98% 99% 99% 98%  ?Weight:    75.8 kg  ?Height:    '5\' 7"'$  (1.702 m)  ? ? ?Intake/Output Summary  (Last 24 hours) at 05/06/2021 2138 ?Last data filed at 05/06/2021 2045 ?Gross per 24 hour  ?Intake 283.11 ml  ?Output --  ?Net 283.11 ml  ? ? ?  05/06/2021  ?  8:24 PM 05/06/2021  ?  1:07  PM 04/05/2021  ?  2:29 PM  ?Last 3 Weights  ?Weight (lbs) 167 lb 1.7 oz 170 lb 165 lb 8 oz  ?Weight (kg) 75.8 kg 77.111 kg 75.07 kg  ?   ?Body mass index is 26.17 kg/m?.  ?General:  Well nourished, well developed, in no acute distress ?HEENT: normal ?Neck: no JVD ?Vascular: No carotid bruits; Distal pulses 2+ bilaterally   ?Cardiac:  normal S1, S2; RRR; no murmur  ?Lungs:  clear to auscultation bilaterally, no wheezing, rhonchi or rales  ?Abd: soft, nontender, no hepatomegaly  ?Ext: no edema ?Musculoskeletal:  No deformities, BUE and BLE strength normal and equal ?Skin: warm and dry  ?Neuro:  CNs 2-12 intact, no focal abnormalities noted ?Psych:  Normal affect  ? ? ?EKG:  The ECG that was done showed Afib RVR previously; now in sinus bradycardia ? ?Relevant CV Studies: ? ?No work up done prior.  ? ?Laboratory Data: ? ?High Sensitivity Troponin:   ?Recent Labs  ?Lab 05/06/21 ?1326 05/06/21 ?1518  ?TROPONINIHS 31* 224*  ?    ?Chemistry ?Recent Labs  ?Lab 05/06/21 ?1326  ?NA 139  ?K 3.5  ?CL 102  ?CO2 23  ?GLUCOSE 114*  ?BUN 21  ?CREATININE 1.07  ?CALCIUM 10.3  ?MG 1.9  ?GFRNONAA >60  ?ANIONGAP 14  ?  ?Recent Labs  ?Lab 05/06/21 ?1326  ?PROT 8.3*  ?ALBUMIN 5.2*  ?AST 21  ?ALT 21  ?ALKPHOS 77  ?BILITOT 0.8  ? ?Lipids No results for input(s): CHOL, TRIG, HDL, LABVLDL, LDLCALC, CHOLHDL in the last 168 hours. ?Hematology ?Recent Labs  ?Lab 05/06/21 ?1326  ?WBC 6.2  ?RBC 5.53  ?HGB 16.2  ?HCT 47.7  ?MCV 86.3  ?MCH 29.3  ?MCHC 34.0  ?RDW 13.2  ?PLT 126*  ? ?Thyroid No results for input(s): TSH, FREET4 in the last 168 hours. ?BNP ?Recent Labs  ?Lab 05/06/21 ?1326  ?BNP 76.9  ?  ?DDimer No results for input(s): DDIMER in the last 168 hours. ? ? ?Radiology/Studies:  ?DG Chest 2 View ? ?Result Date: 05/06/2021 ?CLINICAL DATA:  Chest pain and  shortness of breath. EXAM: CHEST - 2 VIEW COMPARISON:  04/05/2021 FINDINGS: Normal heart size. No pleural effusion or edema. No airspace densities. Visualized osseous structures appear intact. IMPRESSION: No act

## 2021-05-06 NOTE — Progress Notes (Signed)
Rush Landmark presents today states that really has not been any change she still has a lot of pain to the lesser metatarsophalangeal joints bilaterally.  Majority of his pain he states is rover here as she points to the fourth fifth metatarsal cuboid articulation left foot. ? ?Objective: Vital signs are stable alert and oriented x3.  Pulses are palpable.  Pain to palpation fourth fifth met cuboid also pain on palpation lesser metatarsal phalangeal joints. ? ?Assessment: Capsulitis forefoot and fourth fifth TMT joint. ? ?Plan: I injected the area of the fourth fifth TMT joint today Kenalog and local anesthetic 10 mg was injected he will follow-up with Aaron Edelman for set of orthotics. ?

## 2021-05-06 NOTE — ED Provider Notes (Addendum)
[facility] EMERGENCY DEPT Provider Note   CSN: [id number] Arrival date & time: 05/06/21  1255     History  Chief Complaint  Patient presents with   Chest Pain    Nathaniel Bender is a 71 y.o. male.  The history is provided by the patient, the spouse and medical records. No language interpreter was used.  Chest Pain Pain location:  Substernal area Pain quality: aching, crushing and pressure   Pain radiates to:  Does not radiate Pain severity:  Moderate Onset quality:  Sudden Timing:  Sporadic Progression:  Resolved Chronicity:  Recurrent Relieved by:  Nothing Worsened by:  Exertion Ineffective treatments:  None tried Associated symptoms: fatigue, near-syncope and shortness of breath   Associated symptoms: no abdominal pain, no altered mental status, no back pain, no claudication, no cough, no dizziness, no dysphagia, no headache, no heartburn, no nausea, no numbness, no palpitations, no syncope, no vomiting and no weakness   Risk factors: hypertension and male sex   Risk factors: no prior DVT/PE       Home Medications Prior to Admission medications   Medication Sig Start Date End Date Taking? Authorizing Provider  amLODipine (NORVASC) 5 MG tablet Take 1 tablet (5 mg total) by mouth daily. 04/05/20   Shade Flood, MD  amoxicillin-clavulanate (AUGMENTIN) 875-125 MG tablet Take 1 tablet by mouth 2 (two) times daily. 04/05/21   Oretha Milch, MD  aspirin EC 81 MG tablet Take 81 mg by mouth daily.    [provider]  atorvastatin (LIPITOR) 40 MG tablet Take 1 tablet (40 mg total) by mouth daily. 08/06/19   Noni Saupe, MD  loratadine (CLARITIN) 10 MG tablet Take by mouth. 05/01/20   [provider]  loratadine (CLARITIN) 10 MG tablet Take 1 tablet by mouth daily. 04/03/21   [provider]  losartan (COZAAR) 50 MG tablet Take 1 tablet by mouth daily. 10/11/20   [provider]  montelukast (SINGULAIR) 10 MG tablet Take  10 mg by mouth daily. 02/28/21   [provider]  omeprazole (PRILOSEC) 20 MG capsule Take 1 capsule (20 mg total) by mouth daily. 02/24/20   Georgina Quint, MD  predniSONE (DELTASONE) 10 MG tablet Take 1 tablet (10 mg total) by mouth daily with breakfast. 04/05/21   Oretha Milch, MD  tadalafil, PAH, (ADCIRCA) 20 MG tablet TAKE 1/2 TO 1 TABLET (10-20MG  TOTAL) BY MOUTH EVERY OTHER DAY AS NEEDED FOR ERECTILE DYSFUNCTION 08/06/19   Lezlie Lye, Meda Coffee, MD  Turmeric 500 MG TABS Take by mouth.    [provider]      Allergies    Patient has no known allergies.    Review of Systems   Review of Systems  Constitutional:  Positive for fatigue. Negative for chills.  HENT:  Negative for congestion and trouble swallowing.   Eyes:  Negative for visual disturbance.  Respiratory:  Positive for chest tightness and shortness of breath. Negative for cough.   Cardiovascular:  Positive for chest pain and near-syncope. Negative for palpitations, claudication and syncope.  Gastrointestinal:  Negative for abdominal pain, constipation, diarrhea, heartburn, nausea and vomiting.  Genitourinary:  Negative for dysuria and flank pain.  Musculoskeletal:  Negative for back pain, neck pain and neck stiffness.  Skin:  Negative for rash and wound.  Neurological:  Negative for dizziness, syncope, weakness, light-headedness, numbness and headaches.  Psychiatric/Behavioral:  Negative for agitation and confusion.    Physical Exam Updated Vital Signs BP (!) 175/125 (  BP Location: Left Arm)   Pulse 67   Temp 97.6 F (36.4 C)   Resp 16   Ht 5\' 6"  (1.676 m)   Wt 77.1 kg   SpO2 100%   BMI 27.44 kg/m  Physical Exam Vitals and nursing note reviewed.  Constitutional:      General: He is not in acute distress.    Appearance: He is well-developed. He is not ill-appearing, toxic-appearing or diaphoretic.  HENT:     Head: Normocephalic and atraumatic.  Eyes:     Extraocular Movements: Extraocular  movements intact.     Conjunctiva/sclera: Conjunctivae normal.     Pupils: Pupils are equal, round, and reactive to light.  Cardiovascular:     Rate and Rhythm: Regular rhythm. Tachycardia present.     Heart sounds: Normal heart sounds. No murmur heard. Pulmonary:     Effort: Pulmonary effort is normal. No respiratory distress.     Breath sounds: Normal breath sounds. No wheezing or rhonchi.  Chest:     Chest wall: No tenderness.  Abdominal:     Palpations: Abdomen is soft.     Tenderness: There is no abdominal tenderness.  Musculoskeletal:        General: No swelling.     Cervical back: Neck supple.     Right lower leg: No edema.     Left lower leg: No edema.  Skin:    General: Skin is warm and dry.     Capillary Refill: Capillary refill takes less than 2 seconds.     Findings: No erythema.  Neurological:     General: No focal deficit present.     Mental Status: He is alert.  Psychiatric:        Mood and Affect: Mood normal. Mood is not anxious.    ED Results / Procedures / Treatments   Labs (all labs ordered are listed, but only abnormal results are displayed) Labs Reviewed  CBC - Abnormal; Notable for the following components:      Result Value   Platelets 126 (*)    All other components within normal limits  COMPREHENSIVE METABOLIC PANEL - Abnormal; Notable for the following components:   Glucose, Bld 114 (*)    Total Protein 8.3 (*)    Albumin 5.2 (*)    All other components within normal limits  TROPONIN I (HIGH SENSITIVITY) - Abnormal; Notable for the following components:   Troponin I (High Sensitivity) 31 (*)    All other components within normal limits  MAGNESIUM  BRAIN NATRIURETIC PEPTIDE  TROPONIN I (HIGH SENSITIVITY)    EKG EKG Interpretation  Date/Time:  Sunday May 06 2021 13:03:49 EDT Ventricular Rate:  121 PR Interval:    QRS Duration: 80 QT Interval:  326 QTC Calculation: 462 R Axis:   -65 Text Interpretation: Atrial fibrillation with  rapid ventricular response with premature ventricular or aberrantly conducted complexes Left axis deviation Possible Anterior infarct , age undetermined Abnormal ECG No previous ECGs available no prior ECG for comparison. No STEMI Confirmed by Theda Belfast ([id number]) on 05/06/2021 1:25:12 PM  Radiology DG Chest 2 View  Result Date: 05/06/2021 CLINICAL DATA:  Chest pain and shortness of breath. EXAM: CHEST - 2 VIEW COMPARISON:  04/05/2021 FINDINGS: Normal heart size. No pleural effusion or edema. No airspace densities. Visualized osseous structures appear intact. IMPRESSION: No active cardiopulmonary abnormalities. Electronically Signed   By: Signa Kell M.D.   On: 05/06/2021 13:57    Procedures Procedures    CRITICAL CARE Performed  by: Canary Brim Quint Chestnut Total critical care time: 35 minutes Critical care time was exclusive of separately billable procedures and treating other patients. Critical care was necessary to treat or prevent imminent or life-threatening deterioration. Critical care was time spent personally by me on the following activities: development of treatment plan with patient and/or surrogate as well as nursing, discussions with consultants, evaluation of patient's response to treatment, examination of patient, obtaining history from patient or surrogate, ordering and performing treatments and interventions, ordering and review of laboratory studies, ordering and review of radiographic studies, pulse oximetry and re-evaluation of patient's condition.   Medications Ordered in ED Medications  aspirin chewable tablet 324 mg (324 mg Oral Given 05/06/21 1514)    ED Course/ Medical Decision Making/ A&P                           Medical Decision Making Amount and/or Complexity of Data Reviewed Labs: ordered. Radiology: ordered.  Risk OTC drugs. Decision regarding hospitalization.    Keylin Stettner is a 71 y.o. male with a past medical history significant for  hypertension, hyperlipidemia, Mnire's disease, recurrent bronchospasm, and GERD who presents with recurrent episodes of exertional chest pain and shortness of breath but an episode today that involved near syncope.  According to patient, for the last few months he has had episodes of exertional chest pressure and pain especially he is walking his dogs.  He says that today, while moving some boxes, he had onset of aching and pressure in his central chest that did not radiate.  He had shortness of breath and near syncope and lightheadedness with it.  He reports it lasted about 15 minutes before starting to improve.  He said that his father had an MI about his age but otherwise the patient himself has not had any history of cardiac disease.  He does not feel any palpitations today and denies any history of A-fib or arrhythmias.  He denies any other recent symptoms such as fevers, chills, J[place], cough, nausea, vomiting, constipation, diarrhea, or urinary changes.  Denies leg pain or leg swelling.  Denies history of DVT or PE.  On exam, lungs are clear and chest was nontender.  Abdomen was nontender.  No murmur.  Good pulses in extremities.  Legs are nonedematous.  Patient resting comfortably with heart rate in the 60s however during my initial evaluation he did have a brief episode of what appeared to be A-fib with RVR with a rate in the 150s on telemetry monitoring.  Initial EKG does indeed show evidence of A-fib intermittently with RVR.  No STEMI.  Clinically I am concerned that patient could be having NSTEMI and that is leading to some irregularity of his heart rate and rhythm.  His exertional symptoms are also concerning and when I saw the episode of arrhythmia on the monitor patient was asymptomatic.  Thus, his chest pain symptoms do not correlate with some of the EKG changes making me think that the EKG injuries are a response to something cardiac going on.  Patient is currently resting comfortably and  has not had aspirin today.  We will give aspirin get labs and call cardiology.  We will get chest x-ray as well.  X-ray reassuring.  Initial troponin elevated at 31.  Other labs overall reassuring.  BNP is still in process as is a magnesium given the cardiac changes.  Spoke to Dr. Shari Prows with cardiology who agrees that patient will need admission to  their service for further management.  She agreed with heparinization for either the NSTEMI versus paroxysmal A-fib and agrees with admission for work-up.  Patient be admitted for further management.  Patient resting comfortably in no distress at this time.        Final Clinical Impression(s) / ED Diagnoses Final diagnoses:  Chest pain, unspecified type  Elevated troponin    Clinical Impression: 1. Chest pain, unspecified type   2. Elevated troponin     Disposition: Admit  This note was prepared with assistance of Dragon voice recognition software. Occasional wrong-word or sound-a-like substitutions may have occurred due to the inherent limitations of voice recognition software.     Maks Cavallero, Canary Brim, MD 05/06/21 1524    Marcelia Petersen, Canary Brim, MD 05/06/21 1524

## 2021-05-06 NOTE — ED Triage Notes (Signed)
Pt c/o mid chest pain with shortness of breath onset while moving boxes.  ?

## 2021-05-06 NOTE — Progress Notes (Signed)
ANTICOAGULATION CONSULT NOTE - Initial Consult ? ?Pharmacy Consult for heparin ?Indication: chest pain/ACS ? ?No Known Allergies ? ?Patient Measurements: ?Height: '5\' 6"'$  (167.6 cm) ?Weight: 77.1 kg (170 lb) ?IBW/kg (Calculated) : 63.8 ?Heparin Dosing Weight: 77 kg  ? ?Vital Signs: ?Temp: 97.6 ?F (36.4 ?C) (04/16 1307) ?BP: 154/108 (04/16 1518) ?Pulse Rate: 49 (04/16 1518) ? ?Labs: ?Recent Labs  ?  05/06/21 ?1326  ?HGB 16.2  ?HCT 47.7  ?PLT 126*  ?CREATININE 1.07  ?TROPONINIHS 31*  ? ? ?Estimated Creatinine Clearance: 63.7 mL/min (by C-G formula based on SCr of 1.07 mg/dL). ? ? ?Medical History: ?Past Medical History:  ?Diagnosis Date  ? Arthritis   ? Degenerative Disc Disease Cervical; s/p injection [facility].  ? Atypical mole 05/04/2013  ? Right Wrist-Severe  ? Cancer ([place]) 07/21/2013  ? Skin cancer unknown type  ? Colon polyps   ? GERD (gastroesophageal reflux disease)   ? Gout   ? Hyperlipidemia   ? Hypertension   ? Meniere's disease of left ear   ? s/p ENT evaluation; acute hearing loss; chronic tinnitus L now.  Recovered hearing completely.  ? SCCA (squamous cell carcinoma) of skin 08/08/2014  ? Left Forehead-Well Diff (curet and 5FU)  ? ? ?Medications:  ?(Not in a hospital admission)  ? ?Assessment: ?64 YOM who presented with chest pain and elevated troponin concerning for ACS. Pharmacy consulted to start IV heparin.  ? ?H/H wnl, Plt low. SCr wnl ? ?Goal of Therapy:  ?Heparin level 0.3-0.7 units/ml ?Monitor platelets by anticoagulation protocol: Yes ?  ?Plan:  ?-Heparin 4000 units IV bolus followed by heparin infusion at 1000 units/hr ?-F/u 6 hr HL ?-Monitor daily HL, CBC and s/s of bleeding  ? ?Albertina Parr, PharmD., BCCCP ?Clinical Pharmacist ?Please refer to AMION for unit-specific pharmacist  ? ? ? ?

## 2021-05-06 NOTE — ED Notes (Signed)
Report given to [facility] at bedside. 

## 2021-05-06 NOTE — ED Notes (Signed)
Patient eating a microwave meal that was given to him by this RN.  Patient currently denies chest pain/shob/dizziness.  ?

## 2021-05-06 NOTE — ED Notes (Signed)
He spontaneously converted to nsr while Dr. Sherry Ruffing was performing his exam.  ?

## 2021-05-07 ENCOUNTER — Inpatient Hospital Stay (HOSPITAL_COMMUNITY): Payer: Medicare Other

## 2021-05-07 ENCOUNTER — Encounter (HOSPITAL_COMMUNITY): Payer: Self-pay | Admitting: Internal Medicine

## 2021-05-07 ENCOUNTER — Inpatient Hospital Stay (HOSPITAL_COMMUNITY)
Admission: EM | Disposition: A | Discharge status: Home / Self Care | Payer: Self-pay | Source: Home / Self Care | Attending: Thoracic Surgery (Cardiothoracic Vascular Surgery)

## 2021-05-07 DIAGNOSIS — R778 Other specified abnormalities of plasma proteins: Secondary | ICD-10-CM

## 2021-05-07 DIAGNOSIS — I248 Other forms of acute ischemic heart disease: Secondary | ICD-10-CM | POA: Diagnosis not present

## 2021-05-07 DIAGNOSIS — Z01810 Encounter for preprocedural cardiovascular examination: Secondary | ICD-10-CM

## 2021-05-07 DIAGNOSIS — I2510 Atherosclerotic heart disease of native coronary artery without angina pectoris: Secondary | ICD-10-CM

## 2021-05-07 DIAGNOSIS — R079 Chest pain, unspecified: Secondary | ICD-10-CM | POA: Diagnosis not present

## 2021-05-07 DIAGNOSIS — E7800 Pure hypercholesterolemia, unspecified: Secondary | ICD-10-CM

## 2021-05-07 DIAGNOSIS — I480 Paroxysmal atrial fibrillation: Secondary | ICD-10-CM

## 2021-05-07 DIAGNOSIS — I10 Essential (primary) hypertension: Secondary | ICD-10-CM

## 2021-05-07 DIAGNOSIS — I214 Non-ST elevation (NSTEMI) myocardial infarction: Secondary | ICD-10-CM | POA: Diagnosis not present

## 2021-05-07 HISTORY — PX: LEFT HEART CATH AND CORONARY ANGIOGRAPHY: CATH118249

## 2021-05-07 LAB — ECHOCARDIOGRAM COMPLETE
Area-P 1/2: 2.66 cm2
Height: 67 in
S' Lateral: 2.6 cm
Weight: 2599.66 oz

## 2021-05-07 LAB — BLOOD GAS, ARTERIAL
Acid-Base Excess: 0.1 mmol/L (ref 0.0–2.0)
Bicarbonate: 23.8 mmol/L (ref 20.0–28.0)
Drawn by: 33100
O2 Saturation: 93.2 %
Patient temperature: 37
pCO2 arterial: 35 mmHg (ref 32–48)
pH, Arterial: 7.44 (ref 7.35–7.45)
pO2, Arterial: 60 mmHg — ABNORMAL LOW (ref 83–108)

## 2021-05-07 LAB — HEPARIN LEVEL (UNFRACTIONATED): Heparin Unfractionated: 0.33 IU/mL (ref 0.30–0.70)

## 2021-05-07 LAB — PREPARE RBC (CROSSMATCH)

## 2021-05-07 LAB — ABO/RH: ABO/RH(D): O POS

## 2021-05-07 SURGERY — LEFT HEART CATH AND CORONARY ANGIOGRAPHY
Anesthesia: LOCAL

## 2021-05-07 MED ORDER — SODIUM CHLORIDE 0.9% FLUSH
3.0000 mL | Freq: Two times a day (BID) | INTRAVENOUS | Status: DC
  Administered 2021-05-07: 3 mL via INTRAVENOUS

## 2021-05-07 MED ORDER — VERAPAMIL HCL 2.5 MG/ML IV SOLN
INTRAVENOUS | Status: AC
  Filled 2021-05-07: qty 2

## 2021-05-07 MED ORDER — MIDAZOLAM HCL 2 MG/2ML IJ SOLN
INTRAMUSCULAR | Status: AC
  Filled 2021-05-07: qty 2

## 2021-05-07 MED ORDER — ASPIRIN 81 MG PO CHEW: 81.0000 mg | CHEWABLE_TABLET | ORAL | Status: DC

## 2021-05-07 MED ORDER — CEFAZOLIN SODIUM-DEXTROSE 2-4 GM/100ML-% IV SOLN
2.0000 g | INTRAVENOUS | Status: DC
  Filled 2021-05-07: qty 100

## 2021-05-07 MED ORDER — SODIUM CHLORIDE 0.9% FLUSH: 3.0000 mL | INTRAVENOUS | Status: DC | PRN

## 2021-05-07 MED ORDER — FENTANYL CITRATE (PF) 100 MCG/2ML IJ SOLN
INTRAMUSCULAR | Status: DC | PRN
Start: 2021-05-07 — End: 2021-05-07
  Administered 2021-05-07: 50 ug via INTRAVENOUS

## 2021-05-07 MED ORDER — HEPARIN (PORCINE) IN NACL 1000-0.9 UT/500ML-% IV SOLN
INTRAVENOUS | Status: AC
  Filled 2021-05-07: qty 500

## 2021-05-07 MED ORDER — NOREPINEPHRINE 4 MG/250ML-% IV SOLN
0.0000 ug/min | INTRAVENOUS | Status: DC
  Filled 2021-05-07: qty 250

## 2021-05-07 MED ORDER — IOHEXOL 350 MG/ML SOLN
INTRAVENOUS | Status: DC | PRN
  Administered 2021-05-07: 20 mL

## 2021-05-07 MED ORDER — HEPARIN 30,000 UNITS/1000 ML (OHS) CELLSAVER SOLUTION
Status: DC
  Filled 2021-05-07: qty 1000

## 2021-05-07 MED ORDER — HYDRALAZINE HCL 20 MG/ML IJ SOLN: 10.0000 mg | INTRAMUSCULAR | Status: DC | PRN

## 2021-05-07 MED ORDER — EPINEPHRINE HCL 5 MG/250ML IV SOLN IN NS
0.0000 ug/min | INTRAVENOUS | Status: DC
  Filled 2021-05-07: qty 250

## 2021-05-07 MED ORDER — LABETALOL HCL 5 MG/ML IV SOLN: 10.0000 mg | INTRAVENOUS | Status: DC | PRN

## 2021-05-07 MED ORDER — MANNITOL 20 % IV SOLN
INTRAVENOUS | Status: DC
  Filled 2021-05-07: qty 13

## 2021-05-07 MED ORDER — METOPROLOL TARTRATE 12.5 MG HALF TABLET
12.5000 mg | ORAL_TABLET | Freq: Once | ORAL | Status: DC
  Filled 2021-05-07: qty 1

## 2021-05-07 MED ORDER — VERAPAMIL HCL 2.5 MG/ML IV SOLN
INTRAVENOUS | Status: DC | PRN
  Administered 2021-05-07: 10 mL via INTRA_ARTERIAL

## 2021-05-07 MED ORDER — PHENYLEPHRINE HCL-NACL 20-0.9 MG/250ML-% IV SOLN
30.0000 ug/min | INTRAVENOUS | Status: DC
  Filled 2021-05-07: qty 250

## 2021-05-07 MED ORDER — TRANEXAMIC ACID (OHS) PUMP PRIME SOLUTION
2.0000 mg/kg | INTRAVENOUS | Status: DC
  Filled 2021-05-07: qty 1.47

## 2021-05-07 MED ORDER — HEPARIN (PORCINE) 25000 UT/250ML-% IV SOLN
1000.0000 [IU]/h | INTRAVENOUS | Status: DC
Start: 2021-05-07 — End: 2021-05-08
  Administered 2021-05-07: 1000 [IU]/h via INTRAVENOUS
  Filled 2021-05-07: qty 250

## 2021-05-07 MED ORDER — HEPARIN (PORCINE) 25000 UT/250ML-% IV SOLN: 1000.0000 [IU]/h | INTRAVENOUS | Status: DC

## 2021-05-07 MED ORDER — SODIUM CHLORIDE 0.9 % IV SOLN: 250.0000 mL | INTRAVENOUS | Status: DC | PRN

## 2021-05-07 MED ORDER — FENTANYL CITRATE (PF) 100 MCG/2ML IJ SOLN
INTRAMUSCULAR | Status: AC
  Filled 2021-05-07: qty 2

## 2021-05-07 MED ORDER — SODIUM CHLORIDE 0.9 % WEIGHT BASED INFUSION
1.0000 mL/kg/h | INTRAVENOUS | Status: DC
  Administered 2021-05-07: 1 mL/kg/h via INTRAVENOUS

## 2021-05-07 MED ORDER — DEXMEDETOMIDINE HCL IN NACL 400 MCG/100ML IV SOLN
0.1000 ug/kg/h | INTRAVENOUS | Status: DC
  Filled 2021-05-07: qty 100

## 2021-05-07 MED ORDER — SODIUM CHLORIDE 0.9 % IV SOLN: INTRAVENOUS | Status: DC

## 2021-05-07 MED ORDER — INSULIN REGULAR(HUMAN) IN NACL 100-0.9 UT/100ML-% IV SOLN
INTRAVENOUS | Status: DC
Start: 2021-05-08 — End: 2021-05-08
  Filled 2021-05-07: qty 100

## 2021-05-07 MED ORDER — HEPARIN (PORCINE) IN NACL 1000-0.9 UT/500ML-% IV SOLN
INTRAVENOUS | Status: DC | PRN
  Administered 2021-05-07 (×2): 500 mL

## 2021-05-07 MED ORDER — CHLORHEXIDINE GLUCONATE CLOTH 2 % EX PADS
6.0000 | MEDICATED_PAD | Freq: Once | CUTANEOUS | Status: AC
  Administered 2021-05-08: 6 via TOPICAL

## 2021-05-07 MED ORDER — LIDOCAINE HCL (PF) 1 % IJ SOLN
INTRAMUSCULAR | Status: AC
  Filled 2021-05-07: qty 30

## 2021-05-07 MED ORDER — MILRINONE LACTATE IN DEXTROSE 20-5 MG/100ML-% IV SOLN
0.3000 ug/kg/min | INTRAVENOUS | Status: DC
  Filled 2021-05-07: qty 100

## 2021-05-07 MED ORDER — BISACODYL 5 MG PO TBEC: 5.0000 mg | DELAYED_RELEASE_TABLET | Freq: Once | ORAL | Status: DC

## 2021-05-07 MED ORDER — SODIUM CHLORIDE 0.9 % WEIGHT BASED INFUSION: 1.0000 mL/kg/h | INTRAVENOUS | Status: DC

## 2021-05-07 MED ORDER — VANCOMYCIN HCL 1250 MG/250ML IV SOLN
1250.0000 mg | INTRAVENOUS | Status: DC
  Filled 2021-05-07: qty 250

## 2021-05-07 MED ORDER — NITROGLYCERIN IN D5W 200-5 MCG/ML-% IV SOLN
2.0000 ug/min | INTRAVENOUS | Status: DC
  Filled 2021-05-07: qty 250

## 2021-05-07 MED ORDER — ~~LOC~~ CARDIAC SURGERY, PATIENT & FAMILY EDUCATION
Freq: Once | Status: DC
Start: 2021-05-08 — End: 2021-05-08
  Filled 2021-05-07: qty 1

## 2021-05-07 MED ORDER — HEPARIN SODIUM (PORCINE) 1000 UNIT/ML IJ SOLN
INTRAMUSCULAR | Status: AC
  Filled 2021-05-07: qty 10

## 2021-05-07 MED ORDER — PAPAVERINE HCL 30 MG/ML IJ SOLN
INTRAMUSCULAR | Status: DC
  Filled 2021-05-07: qty 2.5

## 2021-05-07 MED ORDER — TRANEXAMIC ACID 1000 MG/10ML IV SOLN
1.5000 mg/kg/h | INTRAVENOUS | Status: DC
  Filled 2021-05-07: qty 25

## 2021-05-07 MED ORDER — CHLORHEXIDINE GLUCONATE 0.12 % MT SOLN
15.0000 mL | Freq: Once | OROMUCOSAL | Status: AC
  Administered 2021-05-08: 15 mL via OROMUCOSAL
  Filled 2021-05-07: qty 15

## 2021-05-07 MED ORDER — SODIUM CHLORIDE 0.9 % WEIGHT BASED INFUSION: 3.0000 mL/kg/h | INTRAVENOUS | Status: DC

## 2021-05-07 MED ORDER — POTASSIUM CHLORIDE 2 MEQ/ML IV SOLN
80.0000 meq | INTRAVENOUS | Status: DC
  Filled 2021-05-07: qty 40

## 2021-05-07 MED ORDER — HEPARIN (PORCINE) 25000 UT/250ML-% IV SOLN
1000.0000 [IU]/h | INTRAVENOUS | Status: DC
Start: 2021-05-07 — End: 2021-05-07

## 2021-05-07 MED ORDER — MIDAZOLAM HCL 2 MG/2ML IJ SOLN
INTRAMUSCULAR | Status: DC | PRN
  Administered 2021-05-07: 1 mg via INTRAVENOUS

## 2021-05-07 MED ORDER — TRANEXAMIC ACID (OHS) BOLUS VIA INFUSION
15.0000 mg/kg | INTRAVENOUS | Status: DC
Start: 2021-05-08 — End: 2021-05-08
  Filled 2021-05-07: qty 1106

## 2021-05-07 MED ORDER — TEMAZEPAM 7.5 MG PO CAPS
15.0000 mg | ORAL_CAPSULE | Freq: Once | ORAL | Status: AC | PRN
  Administered 2021-05-07: 15 mg via ORAL
  Filled 2021-05-07: qty 2

## 2021-05-07 MED ORDER — HEPARIN SODIUM (PORCINE) 1000 UNIT/ML IJ SOLN
INTRAMUSCULAR | Status: DC | PRN
  Administered 2021-05-07: 3500 [IU] via INTRAVENOUS

## 2021-05-07 MED ORDER — CHLORHEXIDINE GLUCONATE CLOTH 2 % EX PADS
6.0000 | MEDICATED_PAD | Freq: Once | CUTANEOUS | Status: AC
  Administered 2021-05-07: 6 via TOPICAL

## 2021-05-07 MED ORDER — LIDOCAINE HCL (PF) 1 % IJ SOLN
INTRAMUSCULAR | Status: DC | PRN
  Administered 2021-05-07: 2 mL

## 2021-05-07 SURGICAL SUPPLY — 10 items
CATH 5FR JL3.5 JR4 ANG PIG MP (CATHETERS) ×1 IMPLANT
DEVICE RAD COMP TR BAND LRG (VASCULAR PRODUCTS) ×1 IMPLANT
GLIDESHEATH SLEND SS 6F .021 (SHEATH) ×1 IMPLANT
GUIDEWIRE INQWIRE 1.5J.035X260 (WIRE) IMPLANT
INQWIRE 1.5J .035X260CM (WIRE) ×2
KIT HEART LEFT (KITS) ×2 IMPLANT
PACK CARDIAC CATHETERIZATION (CUSTOM PROCEDURE TRAY) ×2 IMPLANT
SYR MEDRAD MARK 7 150ML (SYRINGE) ×2 IMPLANT
TRANSDUCER W/STOPCOCK (MISCELLANEOUS) ×2 IMPLANT
TUBING CIL FLEX 10 FLL-RA (TUBING) ×2 IMPLANT

## 2021-05-07 NOTE — Progress Notes (Signed)
Pre-CABG ultrasound study completed.  ? ?Please see CV Proc for preliminary results.  ? ?Darlin Coco, RDMS, RVT ? ?

## 2021-05-07 NOTE — Plan of Care (Signed)
?  Problem: Education: Goal: Knowledge of General Education information will improve Description: Including pain rating scale, medication(s)/side effects and non-pharmacologic comfort measures Outcome: Progressing   Problem: Health Behavior/Discharge Planning: Goal: Ability to manage health-related needs will improve Outcome: Progressing   Problem: Clinical Measurements: Goal: Will remain free from infection Outcome: Progressing   Problem: Pain Managment: Goal: General experience of comfort will improve Outcome: Progressing   Problem: Skin Integrity: Goal: Risk for impaired skin integrity will decrease Outcome: Progressing   

## 2021-05-07 NOTE — Consult Note (Addendum)
[address]       [place] [address]             [phone]        Keson Crew [facility] Medical Record #[id number] Date of Birth: 08-Jul-1950  Referring: End Primary Care: [name], MD Primary Cardiologist:Bridgette Cristal Deer, MD  Chief Complaint:    Chief Complaint  Patient presents with   Chest Pain   History of Present Illness:      Nathaniel Bender is a 71 yo male iwith history of HTN, HLD, Meniere's Disease, and GERD who presented to the [facility] Emergency Department with complaints of chest pain and shortness of breath.  This developed while the patient was moving boxes.  These episodes had happened previously but on day of presentation he suffered a near syncopal episode.  The episode lasted 15 minutes before it resolved.  Workup in the ED showed the patient to be in Atrial fibrillation with RVR.  His initial troponin level was 31.  Cardiology consulted was obtained and they were in agreement the patient would benefit from admission.  Subsequent troponin levels were elevated.  He was ruled in for NSTEMI.  Cardiac catheterization was performed today and showed multivessel CAD with LM disease.  It was felt coronary bypass grafting would be indicated and TCTS consult was requested.  The patient is currently chest pain free.  He denies history of smoking.  He is accompanied by his wife and all of their questions were addressed.  He is agreeable to proceed with surgery.  Current Activity/ Functional Status: Patient is independent with mobility/ambulation, transfers, ADL's, IADL's.   Zubrod Score: At the time of surgery this patient's most appropriate activity status/level should be described as: []     0    Normal activity, no symptoms [x]     1    Restricted in physical strenuous activity but ambulatory, able to do out light work []     2    Ambulatory and capable of self care, unable to do work activities, up and about                 more than 50%  Of  the time                            []     3    Only limited self care, in bed greater than 50% of waking hours []     4    Completely disabled, no self care, confined to bed or chair []     5    Moribund  Past Medical History:  Diagnosis Date   Arthritis    Degenerative Disc Disease Cervical; s/p injection [facility].   Atypical mole 05/04/2013   Right Wrist-Severe   Cancer (HCC) 07/21/2013   Skin cancer unknown type   Colon polyps    GERD (gastroesophageal reflux disease)    Gout    Hyperlipidemia    Hypertension    Meniere's disease of left ear    s/p ENT evaluation; acute hearing loss; chronic tinnitus L now.  Recovered hearing completely.   SCCA (squamous cell carcinoma) of skin 08/08/2014   Left Forehead-Well Diff (curet and 5FU)    Past Surgical History:  Procedure Laterality Date   LEFT HEART CATH AND CORONARY ANGIOGRAPHY N/A 05/07/2021   Procedure: LEFT HEART CATH AND CORONARY ANGIOGRAPHY;  Surgeon: Yvonne Kendall, MD;  Location: [facility] INVASIVE CV  LAB;  Service: Cardiovascular;  Laterality: N/A;   ROTATOR CUFF REPAIR Right    TONSILLECTOMY      Social History   Tobacco Use  Smoking Status Never  Smokeless Tobacco Former    Social History   Substance and Sexual Activity  Alcohol Use Yes   Alcohol/week: 20.0 standard drinks   Types: 10 Glasses of wine, 10 Standard drinks or equivalent per week   No Known Allergies  Current Facility-Administered Medications  Medication Dose Route Frequency Provider Last Rate Last Admin   0.9 %  sodium chloride infusion   Intravenous Continuous End, Christopher, MD 75 mL/hr at 05/07/21 1449 New Bag at 05/07/21 1449   0.9 %  sodium chloride infusion  250 mL Intravenous PRN End, Cristal Deer, MD       acetaminophen (TYLENOL) tablet 650 mg  650 mg Oral Q4H PRN End, Cristal Deer, MD       ALPRAZolam Prudy Feeler) tablet 0.25 mg  0.25 mg Oral BID PRN End, Cristal Deer, MD   0.25 mg at 05/06/21 2325   amLODipine (NORVASC) tablet 5 mg  5 mg  Oral Daily End, Christopher, MD   5 mg at 05/07/21 0845   aspirin EC tablet 81 mg  81 mg Oral Daily End, Christopher, MD   81 mg at 05/07/21 0845   atorvastatin (LIPITOR) tablet 80 mg  80 mg Oral Daily End, Christopher, MD   80 mg at 05/07/21 0845   heparin ADULT infusion 100 units/mL (25000 units/218mL)  1,000 Units/hr Intravenous Continuous Meriam Sprague, MD       hydrALAZINE (APRESOLINE) injection 10 mg  10 mg Intravenous Q20 Min PRN End, Cristal Deer, MD       labetalol (NORMODYNE) injection 10 mg  10 mg Intravenous Q10 min PRN End, Cristal Deer, MD       losartan (COZAAR) tablet 50 mg  50 mg Oral Daily End, Christopher, MD   50 mg at 05/07/21 0846   montelukast (SINGULAIR) tablet 10 mg  10 mg Oral Daily End, Christopher, MD   10 mg at 05/07/21 0845   nitroGLYCERIN (NITROSTAT) SL tablet 0.4 mg  0.4 mg Sublingual Q5 Min x 3 PRN End, Cristal Deer, MD       ondansetron (ZOFRAN) injection 4 mg  4 mg Intravenous Q6H PRN End, Cristal Deer, MD       pantoprazole (PROTONIX) EC tablet 40 mg  40 mg Oral Daily End, Christopher, MD   40 mg at 05/07/21 0845   sodium chloride flush (NS) 0.9 % injection 3 mL  3 mL Intravenous Q12H End, Christopher, MD       sodium chloride flush (NS) 0.9 % injection 3 mL  3 mL Intravenous Q12H End, Christopher, MD       sodium chloride flush (NS) 0.9 % injection 3 mL  3 mL Intravenous PRN End, Cristal Deer, MD        Medications Prior to Admission  Medication Sig Dispense Refill Last Dose   amLODipine (NORVASC) 5 MG tablet Take 1 tablet (5 mg total) by mouth daily. 30 tablet 0 05/06/2021   aspirin EC 81 MG tablet Take 81 mg by mouth at bedtime.   4.15.23   atorvastatin (LIPITOR) 40 MG tablet Take 1 tablet (40 mg total) by mouth daily. (Patient taking differently: Take 40 mg by mouth at bedtime.) 90 tablet 1 4.15.23   ibuprofen (ADVIL) 200 MG tablet Take 400 mg by mouth every 6 (six) hours as needed for headache or mild pain.   Past Week   losartan (  COZAAR) 50 MG tablet  Take 50 mg by mouth daily.   05/06/2021   montelukast (SINGULAIR) 10 MG tablet Take 10 mg by mouth daily.   05/06/2021   naproxen sodium (ALEVE) 220 MG tablet Take 220 mg by mouth daily as needed (pain).   Past Week   omeprazole (PRILOSEC) 20 MG capsule Take 1 capsule (20 mg total) by mouth daily. 90 capsule 0 05/06/2021   tadalafil, PAH, (ADCIRCA) 20 MG tablet TAKE 1/2 TO 1 TABLET (10-20MG  TOTAL) BY MOUTH EVERY OTHER DAY AS NEEDED FOR ERECTILE DYSFUNCTION (Patient taking differently: Take 10-20 mg by mouth every other day as needed (erectile dysfunction).) 24 tablet 5 unk   Turmeric 500 MG TABS Take 500 mg by mouth daily.   05/06/2021   amoxicillin-clavulanate (AUGMENTIN) 875-125 MG tablet Take 1 tablet by mouth 2 (two) times daily. (Patient not taking: Reported on 05/07/2021) 14 tablet 0 Completed Course   predniSONE (DELTASONE) 10 MG tablet Take 1 tablet (10 mg total) by mouth daily with breakfast. (Patient not taking: Reported on 05/07/2021) 20 tablet 0 Completed Course    Family History  Problem Relation Age of Onset   Hyperlipidemia Mother    Hypertension Mother    Arthritis Mother 37       DDD lumbar spine   Diabetes Father    Heart disease Father 15       CAD/CABG   Hyperlipidemia Father    Hypertension Father    Hyperlipidemia Sister    Hypertension Sister    Heart disease Maternal Grandfather    Mental illness Paternal Grandmother    Stroke Paternal Grandfather    Review of Systems:     Cardiac Review of Systems: Y or  [    ]= no  Chest Pain [  Y  ]  Resting SOB [   ] Exertional SOB  [ Y ]  Orthopnea [  ]   Pedal Edema [ N  ]    Palpitations [ Y ] Syncope  [  ]   Presyncope [ Y  ]  General Review of Systems: [Y] = yes [  ]=no Constitional: recent weight change [  ]; anorexia [  ]; fatigue [  ]; nausea [ N ]; night sweats [  ]; fever [  ]; or chills [  ]                                                               Dental: Last Dentist visit:   Eye : blurred vision [  ];  diplopia [   ]; vision changes [  ];  Amaurosis fugax[  ]; Resp: cough [ Y ];  wheezing[  ];  hemoptysis[  ]; shortness of breath[  ]; paroxysmal nocturnal dyspnea[  ]; dyspnea on exertion[  ]; or orthopnea[  ];  GI:  gallstones[  ], vomiting[  ];  dysphagia[  ]; melena[  ];  hematochezia [  ]; heartburn[  ];   Hx of  Colonoscopy[  ]; GU: kidney stones [  ]; hematuria[  ];   dysuria [  ];  nocturia[  ];  history of     obstruction [  ]; urinary frequency [  ]  Skin: rash, swelling[ N ];, hair loss[  ];  peripheral edema[N  ];  or itching[  ]; Musculosketetal: myalgias[  ];  joint swelling[  ];  joint erythema[  ];  joint pain[  ];  back pain[  ];  Heme/Lymph: bruising[  ];  bleeding[  ];  anemia[  ];  Neuro: Deyanira.Kussmaul  ];  headaches[  ];  stroke[  ];  vertigo[  ];  seizures[  ];   paresthesias[  ];  difficulty walking[  ];  Psych:depression[  ]; anxiety[  ];  Physical Exam: BP (!) 156/84 (BP Location: Left Arm)   Pulse 78   Temp (!) 97.5 F (36.4 C) (Oral)   Resp 16   Ht 5\' 7"  (1.702 m)   Wt 73.7 kg   SpO2 96%   BMI 25.45 kg/m   General appearance: alert, cooperative, and no distress Head: Normocephalic, without obvious abnormality, atraumatic Neck: no adenopathy, no carotid bruit, no JVD, supple, symmetrical, trachea midline, and thyroid not enlarged, symmetric, no tenderness/mass/nodules Resp: clear to auscultation bilaterally Cardio: regular rate and rhythm GI: soft, non-tender; bowel sounds normal; no masses,  no organomegaly Extremities: extremities normal, atraumatic, no cyanosis or edema Neurologic: Grossly normal  Diagnostic Studies & Laboratory data:     Recent Radiology Findings:   DG Chest 2 View  Result Date: 05/06/2021 CLINICAL DATA:  Chest pain and shortness of breath. EXAM: CHEST - 2 VIEW COMPARISON:  04/05/2021 FINDINGS: Normal heart size. No pleural effusion or edema. No airspace densities. Visualized osseous structures appear intact. IMPRESSION: No active  cardiopulmonary abnormalities. Electronically Signed   By: Signa Kell M.D.   On: 05/06/2021 13:57   CARDIAC CATHETERIZATION  Result Date: 05/07/2021 Conclusion: Severe LMCA and codominant RCA disease.  LAD, ramus, and LCx demonstrate mild-moderate, non-obstructive disease. Normal left ventricular filling pressure. Recommendations: Cardiac surgery consultation for CABG. Restart IV heparin 2 hours after TR band removal. Aggressive secondary prevention of coronary artery disease. Yvonne Kendall, MD [facility]  ECHOCARDIOGRAM COMPLETE  Result Date: 05/07/2021    ECHOCARDIOGRAM REPORT   Patient Name:   Nathaniel Bender Date of Exam: 05/07/2021 Medical Rec #:  [id number]     Height:       67.0 in Accession #:    [id number]    Weight:       162.5 lb Date of Birth:  Jan 14, 1951     BSA:          1.851 m Patient Age:    71 years      BP:           160/86 mmHg Patient Gender: M             HR:           41 bpm. Exam Location:  Inpatient Procedure: 2D Echo and Strain Analysis Indications:    acute ischemic heart disease  History:        Patient has no prior history of Echocardiogram examinations.                 Signs/Symptoms:Chest Pain; Risk Factors:Hypertension and                 Dyslipidemia.  Sonographer:    Delcie Roch RDCS Referring Phys: [id number] MUHAMMAD S KHAN IMPRESSIONS  1. Left ventricular ejection fraction, by estimation, is 55 to 60%. The left ventricle has normal function. The left ventricle has no regional wall motion abnormalities. Left ventricular diastolic parameters are consistent with Grade I diastolic  dysfunction (impaired relaxation). The average left ventricular global longitudinal strain is -20.5 %. The global longitudinal strain is normal.  2. Right ventricular systolic function is normal. The right ventricular size is normal. There is normal pulmonary artery systolic pressure. The estimated right ventricular systolic pressure is 23.6 mmHg.  3. The mitral valve is normal in  structure. No evidence of mitral valve regurgitation. No evidence of mitral stenosis.  4. The aortic valve is tricuspid. There is mild calcification of the aortic valve. Aortic valve regurgitation is not visualized. Aortic valve sclerosis is present, with no evidence of aortic valve stenosis.  5. The inferior vena cava is normal in size with greater than 50% respiratory variability, suggesting right atrial pressure of 3 mmHg.  6. The patient was markedly bradycardic. FINDINGS  Left Ventricle: Left ventricular ejection fraction, by estimation, is 55 to 60%. The left ventricle has normal function. The left ventricle has no regional wall motion abnormalities. The average left ventricular global longitudinal strain is -20.5 %. The global longitudinal strain is normal. The left ventricular internal cavity size was normal in size. There is no left ventricular hypertrophy. Left ventricular diastolic parameters are consistent with Grade I diastolic dysfunction (impaired relaxation). Right Ventricle: The right ventricular size is normal. No increase in right ventricular wall thickness. Right ventricular systolic function is normal. There is normal pulmonary artery systolic pressure. The tricuspid regurgitant velocity is 2.27 m/s, and  with an assumed right atrial pressure of 3 mmHg, the estimated right ventricular systolic pressure is 23.6 mmHg. Left Atrium: Left atrial size was normal in size. Right Atrium: Right atrial size was normal in size. Pericardium: There is no evidence of pericardial effusion. Mitral Valve: The mitral valve is normal in structure. No evidence of mitral valve regurgitation. No evidence of mitral valve stenosis. Tricuspid Valve: The tricuspid valve is normal in structure. Tricuspid valve regurgitation is trivial. Aortic Valve: The aortic valve is tricuspid. There is mild calcification of the aortic valve. Aortic valve regurgitation is not visualized. Aortic valve sclerosis is present, with no  evidence of aortic valve stenosis. Pulmonic Valve: The pulmonic valve was normal in structure. Pulmonic valve regurgitation is trivial. Aorta: The aortic root is normal in size and structure. Venous: The inferior vena cava is normal in size with greater than 50% respiratory variability, suggesting right atrial pressure of 3 mmHg. IAS/Shunts: No atrial level shunt detected by color flow Doppler.  LEFT VENTRICLE PLAX 2D LVIDd:         4.20 cm   Diastology LVIDs:         2.60 cm   LV e' medial:    6.53 cm/s LV PW:         1.10 cm   LV E/e' medial:  10.3 LV IVS:        1.10 cm   LV e' lateral:   9.90 cm/s LVOT diam:     1.90 cm   LV E/e' lateral: 6.8 LV SV:         81 LV SV Index:   44        2D Longitudinal Strain LVOT Area:     2.84 cm  2D Strain GLS Avg:     -20.5 %  RIGHT VENTRICLE             IVC RV S prime:     10.00 cm/s  IVC diam: 1.70 cm TAPSE (M-mode): 2.6 cm LEFT ATRIUM  Index        RIGHT ATRIUM           Index LA diam:        4.00 cm 2.16 cm/m   RA Area:     15.90 cm LA Vol (A2C):   56.6 ml 30.57 ml/m  RA Volume:   38.90 ml  21.01 ml/m LA Vol (A4C):   48.2 ml 26.04 ml/m LA Biplane Vol: 53.2 ml 28.74 ml/m  AORTIC VALVE LVOT Vmax:   100.00 cm/s LVOT Vmean:  61.600 cm/s LVOT VTI:    0.287 m  AORTA Ao Root diam: 3.20 cm Ao Asc diam:  3.30 cm MITRAL VALVE                TRICUSPID VALVE MV Area (PHT): 2.66 cm     TR Peak grad:   20.6 mmHg MV Decel Time: 285 msec     TR Vmax:        227.00 cm/s MV E velocity: 67.00 cm/s MV A velocity: 104.00 cm/s  SHUNTS MV E/A ratio:  0.64         Systemic VTI:  0.29 m                             Systemic Diam: 1.90 cm Dalton McleanMD Electronically signed by Wilfred Lacy Signature Date/Time: 05/07/2021/3:04:44 PM    Final      I have independently reviewed the above radiologic studies and discussed with the patient   Recent Lab Findings: Lab Results  Component Value Date   WBC 6.7 05/06/2021   HGB 15.5 05/06/2021   HCT 45.6 05/06/2021   PLT 112 (L)  05/06/2021   GLUCOSE 126 (H) 05/06/2021   CHOL 178 05/06/2021   TRIG 148 05/06/2021   HDL 53 05/06/2021   LDLCALC 95 05/06/2021   ALT 21 05/06/2021   AST 21 05/06/2021   NA 139 05/06/2021   K 3.8 05/06/2021   CL 106 05/06/2021   CREATININE 1.03 05/06/2021   BUN 18 05/06/2021   CO2 24 05/06/2021   TSH 2.620 07/30/2019   INR 1.0 05/06/2021   HGBA1C 5.9 (H) 07/30/2019   Assessment / Plan:      CAD- LM disease on cath, requesting CABG.. patient is agreeable to proceed Atrial Fibrillation with RVR on presentation- currently Sinus Bradycardia with rates in the 40s- may benefit from LA Clip during surgery HLD HTN Dispo- patient currently chest pain free, in Sinus Bradycardia, no further, A. Fib... agreeable to proceed with coronary bypass grafting, likely tomorrow with Dr. Cliffton Asters.Marland Kitchen He will follow up with further recommendations  I  spent 55 minutes counseling the patient face to face.   Lowella Dandy, PA-C 05/07/2021 3:37 PM   Agree with above.  This is a 71 year old male this admitted with worsening anginal symptoms, paroxysmal atrial fibrillation with RVR, hypertension and hyperlipidemia.  He underwent a left heart cath today which shows severe left main, and RCA disease.  His echocardiogram shows preserved biventricular function and no significant valvular disease.  We discussed the risks and benefits of surgical revascularization along with atrial clip placement.  He is currently in sinus rhythm but is bradycardic with rates in the 50s.  He is agreeable to proceed and is scheduled for 08/07/2021.  Ronnell Clinger Keane Scrape

## 2021-05-07 NOTE — H&P (View-Only) (Signed)
? ?Progress Note ? ?Patient Name: Nathaniel Bender ?Date of Encounter: 05/07/2021 ? ?[facility] Cardiologist: Buford Dresser, MD  ? ?Subjective  ? ?Chest pain and A-fib with RVR.  Troponin initially 200.  He has converted to normal sinus rhythm.  Denies any further chest pain or shortness of breath. ? ?Inpatient Medications  ?  ?Scheduled Meds: ? amLODipine  5 mg Oral Daily  ? aspirin EC  81 mg Oral Daily  ? atorvastatin  80 mg Oral Daily  ? losartan  50 mg Oral Daily  ? montelukast  10 mg Oral Daily  ? pantoprazole  40 mg Oral Daily  ? ?Continuous Infusions: ? heparin 1,000 Units/hr (05/07/21 0400)  ? ?PRN Meds: ?acetaminophen, ALPRAZolam, nitroGLYCERIN, ondansetron (ZOFRAN) IV  ? ?Vital Signs  ?  ?Vitals:  ? 05/07/21 0357 05/07/21 0529 05/07/21 0723 05/07/21 0726  ?BP: 131/79  (!) 145/123 (!) 160/86  ?Pulse:      ?Resp: '17  13 16  '$ ?Temp: 97.7 ?F (36.5 ?C)  97.8 ?F (36.6 ?C)   ?TempSrc: Oral  Oral   ?SpO2: 97%  97%   ?Weight:  73.7 kg    ?Height:      ? ? ?Intake/Output Summary (Last 24 hours) at 05/07/2021 0746 ?Last data filed at 05/07/2021 0400 ?Gross per 24 hour  ?Intake 834.72 ml  ?Output --  ?Net 834.72 ml  ? ? ?  05/07/2021  ?  5:29 AM 05/06/2021  ?  8:24 PM 05/06/2021  ?  1:07 PM  ?Last 3 Weights  ?Weight (lbs) 162 lb 7.7 oz 167 lb 1.7 oz 170 lb  ?Weight (kg) 73.7 kg 75.8 kg 77.111 kg  ?   ? ?Telemetry  ?  ?Normal sinus rhythm- Personally Reviewed ? ?ECG  ?  ?Sinus bradycardia with nonspecific T wave abnormality- Personally Reviewed ? ?Physical Exam  ? ?GEN: No acute distress.   ?Neck: No JVD ?Cardiac: RRR, no murmurs, rubs, or gallops.  ?Respiratory: Clear to auscultation bilaterally. ?GI: Soft, nontender, non-distended  ?MS: No edema; No deformity. ?Neuro:  Nonfocal  ?Psych: Normal affect  ? ?Labs  ?  ?High Sensitivity Troponin:   ?Recent Labs  ?Lab 05/06/21 ?1326 05/06/21 ?1518  ?TROPONINIHS 31* 224*  ?   ? ?Chemistry ?Recent Labs  ?Lab 05/06/21 ?1326 05/06/21 ?2210  ?NA 139 139  ?K 3.5 3.8  ?CL 102  106  ?CO2 23 24  ?GLUCOSE 114* 126*  ?BUN 21 18  ?CREATININE 1.07 1.03  ?CALCIUM 10.3 9.1  ?PROT 8.3*  --   ?ALBUMIN 5.2*  --   ?AST 21  --   ?ALT 21  --   ?ALKPHOS 77  --   ?BILITOT 0.8  --   ?GFRNONAA >60 >60  ?ANIONGAP 14 9  ?  ? ?Hematology ?Recent Labs  ?Lab 05/06/21 ?1326 05/06/21 ?2210  ?WBC 6.2 6.7  ?RBC 5.53 5.17  ?HGB 16.2 15.5  ?HCT 47.7 45.6  ?MCV 86.3 88.2  ?MCH 29.3 30.0  ?MCHC 34.0 34.0  ?RDW 13.2 13.0  ?PLT 126* 112*  ? ? ?BNP ?Recent Labs  ?Lab 05/06/21 ?1326  ?BNP 76.9  ?  ? ?DDimer No results for input(s): DDIMER in the last 168 hours. ? ? ?CHA2DS2-VASc Score = 2  ?This indicates a 2.2% annual risk of stroke. ?The patient's score is based upon: ?CHF History: 0 ?HTN History: 1 ?Diabetes History: 0 ?Stroke History: 0 ?Vascular Disease History: 0 ?Age Score: 1 ?Gender Score: 0 ? ?Radiology  ?  ?DG Chest 2 View ? ?  Result Date: 05/06/2021 ?CLINICAL DATA:  Chest pain and shortness of breath. EXAM: CHEST - 2 VIEW COMPARISON:  04/05/2021 FINDINGS: Normal heart size. No pleural effusion or edema. No airspace densities. Visualized osseous structures appear intact. IMPRESSION: No active cardiopulmonary abnormalities. Electronically Signed   By: Kerby Moors M.D.   On: 05/06/2021 13:57   ? ?Cardiac Studies  ? ?None ? ?Patient Profile  ?   ?71 y.o. male  with pmh sx for HTN, HLD, GERD, OA  who is being seen 05/06/2021 for the evaluation of NSTEMI and Afib RVR ? ?Assessment & Plan  ?  ?# NSTEMI ?-History concerning for CAD. Has risk factors; plus troponin elevation ?-hs troponin initially 31 and now increased to 224 ?-? Whether NSTEMI type 1 or demand ischemia in the setting of afib with RVR ?-Denies any further chest pain since admission ?-2D echo is pending ?-Continue aspirin 81 mg daily ?-No beta-blocker due to bradycardia ?-Continue IV heparin and high-dose statin ?-He is n.p.o. for with plans for left heart cath with possible PCI today  ?-Shared Decision Making/Informed Consent ?The risks [stroke (1 in  1000), death (1 in 40), kidney failure [usually temporary] (1 in 500), bleeding (1 in 200), allergic reaction [possibly serious] (1 in 200)], benefits (diagnostic support and management of coronary artery disease) and alternatives of a cardiac catheterization were discussed in detail with Nathaniel Bender and he is willing to proceed.  ? ?# Afib RVR ?-Now resolved to sinus bradycardia ?-No rate control needed for now ?-Will start apixaban 5 mg twice daily post cath if no CABG needed given his CHADS2VASC score of 2 ?  ?# GERD: ?-Continue PPI ? ?# HLD:  ?-LDL goal less than 70 if he is found to have CAD  ?-LDL at 95 HDL 53 and triglycerides 148 this admission  ?-Continue atorvastatin 80 mg daily (this was increased from 40 mg daily on admission) ?-He will need FLP and ALT in 6 weeks  ? ?# HTN:  ?-BP is controlled on exam  ?-Continue amlodipine 5 mg daily and losartan 50 mg daily  ? ?I have spent a total of 35 minutes with patient reviewing hospital notes , telemetry, EKGs, labs and examining patient as well as establishing an assessment and plan that was discussed with the patient.  > 50% of time was spent in direct patient care.    ?  ? ?For questions or updates, please contact [place] ?Please consult [email or web address] for contact info under  ? ?  ?   ?Signed, ?Fransico Him, MD  ?05/07/2021, 7:46 AM    ?

## 2021-05-07 NOTE — Progress Notes (Addendum)
? ?Progress Note ? ?Patient Name: Nathaniel Bender ?Date of Encounter: 05/07/2021 ? ?[facility] Cardiologist: Nathaniel Dresser, MD  ? ?Subjective  ? ?Chest pain and A-fib with RVR.  Troponin initially 200.  He has converted to normal sinus rhythm.  Denies any further chest pain or shortness of breath. ? ?Inpatient Medications  ?  ?Scheduled Meds: ? amLODipine  5 mg Oral Daily  ? aspirin EC  81 mg Oral Daily  ? atorvastatin  80 mg Oral Daily  ? losartan  50 mg Oral Daily  ? montelukast  10 mg Oral Daily  ? pantoprazole  40 mg Oral Daily  ? ?Continuous Infusions: ? heparin 1,000 Units/hr (05/07/21 0400)  ? ?PRN Meds: ?acetaminophen, ALPRAZolam, nitroGLYCERIN, ondansetron (ZOFRAN) IV  ? ?Vital Signs  ?  ?Vitals:  ? 05/07/21 0357 05/07/21 0529 05/07/21 0723 05/07/21 0726  ?BP: 131/79  (!) 145/123 (!) 160/86  ?Pulse:      ?Resp: '17  13 16  '$ ?Temp: 97.7 ?F (36.5 ?C)  97.8 ?F (36.6 ?C)   ?TempSrc: Oral  Oral   ?SpO2: 97%  97%   ?Weight:  73.7 kg    ?Height:      ? ? ?Intake/Output Summary (Last 24 hours) at 05/07/2021 0746 ?Last data filed at 05/07/2021 0400 ?Gross per 24 hour  ?Intake 834.72 ml  ?Output --  ?Net 834.72 ml  ? ? ?  05/07/2021  ?  5:29 AM 05/06/2021  ?  8:24 PM 05/06/2021  ?  1:07 PM  ?Last 3 Weights  ?Weight (lbs) 162 lb 7.7 oz 167 lb 1.7 oz 170 lb  ?Weight (kg) 73.7 kg 75.8 kg 77.111 kg  ?   ? ?Telemetry  ?  ?Normal sinus rhythm- Personally Reviewed ? ?ECG  ?  ?Sinus bradycardia with nonspecific T wave abnormality- Personally Reviewed ? ?Physical Exam  ? ?GEN: No acute distress.   ?Neck: No JVD ?Cardiac: RRR, no murmurs, rubs, or gallops.  ?Respiratory: Clear to auscultation bilaterally. ?GI: Soft, nontender, non-distended  ?MS: No edema; No deformity. ?Neuro:  Nonfocal  ?Psych: Normal affect  ? ?Labs  ?  ?High Sensitivity Troponin:   ?Recent Labs  ?Lab 05/06/21 ?1326 05/06/21 ?1518  ?TROPONINIHS 31* 224*  ?   ? ?Chemistry ?Recent Labs  ?Lab 05/06/21 ?1326 05/06/21 ?2210  ?NA 139 139  ?K 3.5 3.8  ?CL 102  106  ?CO2 23 24  ?GLUCOSE 114* 126*  ?BUN 21 18  ?CREATININE 1.07 1.03  ?CALCIUM 10.3 9.1  ?PROT 8.3*  --   ?ALBUMIN 5.2*  --   ?AST 21  --   ?ALT 21  --   ?ALKPHOS 77  --   ?BILITOT 0.8  --   ?GFRNONAA >60 >60  ?ANIONGAP 14 9  ?  ? ?Hematology ?Recent Labs  ?Lab 05/06/21 ?1326 05/06/21 ?2210  ?WBC 6.2 6.7  ?RBC 5.53 5.17  ?HGB 16.2 15.5  ?HCT 47.7 45.6  ?MCV 86.3 88.2  ?MCH 29.3 30.0  ?MCHC 34.0 34.0  ?RDW 13.2 13.0  ?PLT 126* 112*  ? ? ?BNP ?Recent Labs  ?Lab 05/06/21 ?1326  ?BNP 76.9  ?  ? ?DDimer No results for input(s): DDIMER in the last 168 hours. ? ? ?CHA2DS2-VASc Score = 2  ?This indicates a 2.2% annual risk of stroke. ?The patient's score is based upon: ?CHF History: 0 ?HTN History: 1 ?Diabetes History: 0 ?Stroke History: 0 ?Vascular Disease History: 0 ?Age Score: 1 ?Gender Score: 0 ? ?Radiology  ?  ?DG Chest 2 View ? ?  Result Date: 05/06/2021 ?CLINICAL DATA:  Chest pain and shortness of breath. EXAM: CHEST - 2 VIEW COMPARISON:  04/05/2021 FINDINGS: Normal heart size. No pleural effusion or edema. No airspace densities. Visualized osseous structures appear intact. IMPRESSION: No active cardiopulmonary abnormalities. Electronically Signed   By: Kerby Moors M.D.   On: 05/06/2021 13:57   ? ?Cardiac Studies  ? ?None ? ?Patient Profile  ?   ?71 y.o. male  with pmh sx for HTN, HLD, GERD, OA  who is being seen 05/06/2021 for the evaluation of NSTEMI and Afib RVR ? ?Assessment & Plan  ?  ?# NSTEMI ?-History concerning for CAD. Has risk factors; plus troponin elevation ?-hs troponin initially 31 and now increased to 224 ?-? Whether NSTEMI type 1 or demand ischemia in the setting of afib with RVR ?-Denies any further chest pain since admission ?-2D echo is pending ?-Continue aspirin 81 mg daily ?-No beta-blocker due to bradycardia ?-Continue IV heparin and high-dose statin ?-He is n.p.o. for with plans for left heart cath with possible PCI today  ?-Shared Decision Making/Informed Consent ?The risks [stroke (1 in  1000), death (1 in 13), kidney failure [usually temporary] (1 in 500), bleeding (1 in 200), allergic reaction [possibly serious] (1 in 200)], benefits (diagnostic support and management of coronary artery disease) and alternatives of a cardiac catheterization were discussed in detail with Nathaniel Bender and he is willing to proceed.  ? ?# Afib RVR ?-Now resolved to sinus bradycardia ?-No rate control needed for now ?-Will start apixaban 5 mg twice daily post cath if no CABG needed given his CHADS2VASC score of 2 ?  ?# GERD: ?-Continue PPI ? ?# HLD:  ?-LDL goal less than 70 if he is found to have CAD  ?-LDL at 95 HDL 53 and triglycerides 148 this admission  ?-Continue atorvastatin 80 mg daily (this was increased from 40 mg daily on admission) ?-He will need FLP and ALT in 6 weeks  ? ?# HTN:  ?-BP is controlled on exam  ?-Continue amlodipine 5 mg daily and losartan 50 mg daily  ? ?I have spent a total of 35 minutes with patient reviewing hospital notes , telemetry, EKGs, labs and examining patient as well as establishing an assessment and plan that was discussed with the patient.  > 50% of time was spent in direct patient care.    ?  ? ?For questions or updates, please contact [place] ?Please consult [email or web address] for contact info under  ? ?  ?   ?Signed, ?Nathaniel Him, MD  ?05/07/2021, 7:46 AM    ?

## 2021-05-07 NOTE — TOC Progression Note (Signed)
Transition of Care (TOC) - Progression Note  ? ? ?Patient Details  ?Name: Nathaniel Bender ?MRN: [id number] ?Date of Birth: 1950/05/29 ? ?Transition of Care (TOC) CM/SW Contact  ?Angelita Ingles, RN ?Phone Number:[phone] ? ?05/07/2021, 4:05 PM ? ?Clinical Narrative:    ? ?Transition of Care (TOC) Screening Note ? ? ?Patient Details  ?Name: Nathaniel Bender ?Date of Birth: 02/01/1950 ? ? ?Transition of Care (TOC) CM/SW Contact:    ?Angelita Ingles, RN ?Phone Number: ?05/07/2021, 4:05 PM ? ? ? ?Transition of Care Department [facility]) has reviewed patient and no TOC needs have been identified at this time. We will continue to monitor patient advancement through interdisciplinary progression rounds. If new patient transition needs arise, please place a TOC consult. ? ? ? ? ?  ?  ? ?Expected Discharge Plan and Services ?  ?  ?  ?  ?  ?                ?  ?  ?  ?  ?  ?  ?  ?  ?  ?  ? ? ?Social Determinants of Health (SDOH) Interventions ?  ? ?Readmission Risk Interventions ?   ? View : No data to display.  ?  ?  ?  ? ? ?

## 2021-05-07 NOTE — Progress Notes (Signed)
?  Echocardiogram ?2D Echocardiogram has been performed. ? ?Nathaniel Bender ?05/07/2021, 8:51 AM ?

## 2021-05-07 NOTE — Progress Notes (Signed)
TCTS consulted for CABG evaluation. °

## 2021-05-07 NOTE — Progress Notes (Signed)
ANTICOAGULATION CONSULT NOTE  ? ?Pharmacy Consult for heparin ?Indication: chest pain/ACS ? ?No Known Allergies ? ?Patient Measurements: ?Height: '5\' 7"'$  (170.2 cm) ?Weight: 73.7 kg (162 lb 7.7 oz) ?IBW/kg (Calculated) : 66.1 ?Heparin Dosing Weight: 77 kg  ? ?Vital Signs: ?Temp: 97.8 ?F (36.6 ?C) (04/17 9326) ?Temp Source: Oral (04/17 7124) ?BP: 160/86 (04/17 0726) ?Pulse Rate: 78 (04/16 2326) ? ?Labs: ?Recent Labs  ?  05/06/21 ?1326 05/06/21 ?1518 05/06/21 ?2202 05/06/21 ?2210 05/07/21 ?0115  ?HGB 16.2  --   --  15.5  --   ?HCT 47.7  --   --  45.6  --   ?PLT 126*  --   --  112*  --   ?LABPROT  --   --   --  13.2  --   ?INR  --   --   --  1.0  --   ?HEPARINUNFRC  --   --  0.45  --  0.33  ?CREATININE 1.07  --   --  1.03  --   ?TROPONINIHS 31* 224*  --   --   --   ? ? ?Estimated Creatinine Clearance: 63.3 mL/min (by C-G formula based on SCr of 1.03 mg/dL). ? ?Medical History: ?Past Medical History:  ?Diagnosis Date  ? Arthritis   ? Degenerative Disc Disease Cervical; s/p injection [facility].  ? Atypical mole 05/04/2013  ? Right Wrist-Severe  ? Cancer ([place]) 07/21/2013  ? Skin cancer unknown type  ? Colon polyps   ? GERD (gastroesophageal reflux disease)   ? Gout   ? Hyperlipidemia   ? Hypertension   ? Meniere's disease of left ear   ? s/p ENT evaluation; acute hearing loss; chronic tinnitus L now.  Recovered hearing completely.  ? SCCA (squamous cell carcinoma) of skin 08/08/2014  ? Left Forehead-Well Diff (curet and 5FU)  ? ?Medications:  ?Medications Prior to Admission  ?Medication Sig Dispense Refill Last Dose  ? amLODipine (NORVASC) 5 MG tablet Take 1 tablet (5 mg total) by mouth daily. 30 tablet 0 05/06/2021  ? aspirin EC 81 MG tablet Take 81 mg by mouth at bedtime.   4.15.23  ? atorvastatin (LIPITOR) 40 MG tablet Take 1 tablet (40 mg total) by mouth daily. (Patient taking differently: Take 40 mg by mouth at bedtime.) 90 tablet 1 4.15.23  ? ibuprofen (ADVIL) 200 MG tablet Take 400 mg by mouth every 6 (six) hours  as needed for headache or mild pain.   Past Week  ? losartan (COZAAR) 50 MG tablet Take 50 mg by mouth daily.   05/06/2021  ? montelukast (SINGULAIR) 10 MG tablet Take 10 mg by mouth daily.   05/06/2021  ? naproxen sodium (ALEVE) 220 MG tablet Take 220 mg by mouth daily as needed (pain).   Past Week  ? omeprazole (PRILOSEC) 20 MG capsule Take 1 capsule (20 mg total) by mouth daily. 90 capsule 0 05/06/2021  ? tadalafil, PAH, (ADCIRCA) 20 MG tablet TAKE 1/2 TO 1 TABLET (10-'20MG'$  TOTAL) BY MOUTH EVERY OTHER DAY AS NEEDED FOR ERECTILE DYSFUNCTION (Patient taking differently: Take 10-20 mg by mouth every other day as needed (erectile dysfunction).) 24 tablet 5 unk  ? Turmeric 500 MG TABS Take 500 mg by mouth daily.   05/06/2021  ? amoxicillin-clavulanate (AUGMENTIN) 875-125 MG tablet Take 1 tablet by mouth 2 (two) times daily. (Patient not taking: Reported on 05/07/2021) 14 tablet 0 Completed Course  ? predniSONE (DELTASONE) 10 MG tablet Take 1 tablet (10 mg total) by mouth daily  with breakfast. (Patient not taking: Reported on 05/07/2021) 20 tablet 0 Completed Course  ? ? ?Assessment: ?34 YOM who presented with chest pain and elevated troponin concerning for ACS. Also had run of Afib RVR > converted to SR. Pharmacy consulted to start IV heparin.  ? ?Heparin level therapeutic: 0.33 on heparin drip rate 1000 uts/hr  CBC stable, no infusion issues or s/sx of bleeding reported ? ?Goal of Therapy:  ?Heparin level 0.3-0.7 units/ml ?Monitor platelets by anticoagulation protocol: Yes ?  ?Plan:  ?-Continue heparin infusion at 1000 units/hr ?-Monitor daily Heparin Level, CBC and s/sx of bleeding  ?- follow up post cath for oral anticoagulation  ? ?Bonnita Nasuti Pharm.D. CPP, BCPS ?Clinical Pharmacist ?[phone] ?05/07/2021 9:29 AM  ? ?Please check AMION for all [place] numbers ? ? ? ?

## 2021-05-07 NOTE — Interval H&P Note (Signed)
History and Physical Interval Note: ? ?05/07/2021 ?1:32 PM ? ?Nathaniel Bender  has presented today for surgery, with the diagnosis of NSTEMI.  The various methods of treatment have been discussed with the patient and family. After consideration of risks, benefits and other options for treatment, the patient has consented to  Procedure(s): ?LEFT HEART CATH AND CORONARY ANGIOGRAPHY (N/A) as a surgical intervention.  The patient's history has been reviewed, patient examined, no change in status, stable for surgery.  I have reviewed the patient's chart and labs.  Questions were answered to the patient's satisfaction.   ? ?Cath Lab Visit (complete for each Cath Lab visit) ? ?Clinical Evaluation Leading to the Procedure:  ? ?ACS: Yes.   ? ?Non-ACS:  N/A ? ?[name] ? ? ?

## 2021-05-08 ENCOUNTER — Inpatient Hospital Stay (HOSPITAL_COMMUNITY): Payer: Medicare Other

## 2021-05-08 ENCOUNTER — Inpatient Hospital Stay (HOSPITAL_COMMUNITY): Payer: Medicare Other | Admitting: Certified Registered Nurse Anesthetist

## 2021-05-08 ENCOUNTER — Encounter (HOSPITAL_COMMUNITY)
Admission: EM | Disposition: A | Discharge status: Home / Self Care | Payer: Self-pay | Source: Home / Self Care | Attending: Thoracic Surgery (Cardiothoracic Vascular Surgery)

## 2021-05-08 DIAGNOSIS — I214 Non-ST elevation (NSTEMI) myocardial infarction: Secondary | ICD-10-CM | POA: Diagnosis not present

## 2021-05-08 DIAGNOSIS — E785 Hyperlipidemia, unspecified: Secondary | ICD-10-CM

## 2021-05-08 DIAGNOSIS — I252 Old myocardial infarction: Secondary | ICD-10-CM

## 2021-05-08 DIAGNOSIS — I10 Essential (primary) hypertension: Secondary | ICD-10-CM

## 2021-05-08 DIAGNOSIS — I25110 Atherosclerotic heart disease of native coronary artery with unstable angina pectoris: Secondary | ICD-10-CM

## 2021-05-08 DIAGNOSIS — I2510 Atherosclerotic heart disease of native coronary artery without angina pectoris: Secondary | ICD-10-CM | POA: Diagnosis present

## 2021-05-08 DIAGNOSIS — Z951 Presence of aortocoronary bypass graft: Secondary | ICD-10-CM

## 2021-05-08 HISTORY — PX: CLIPPING OF ATRIAL APPENDAGE: SHX5773

## 2021-05-08 HISTORY — PX: TEE WITHOUT CARDIOVERSION: SHX5443

## 2021-05-08 HISTORY — PX: ENDOVEIN HARVEST OF GREATER SAPHENOUS VEIN: SHX5059

## 2021-05-08 HISTORY — PX: CORONARY ARTERY BYPASS GRAFT: SHX141

## 2021-05-08 LAB — POCT I-STAT, CHEM 8
BUN: 17 mg/dL (ref 8–23)
BUN: 18 mg/dL (ref 8–23)
BUN: 16 mg/dL (ref 8–23)
BUN: 13 mg/dL (ref 8–23)
BUN: 14 mg/dL (ref 8–23)
Calcium, Ion: 1.23 mmol/L (ref 1.15–1.40)
Calcium, Ion: 1.21 mmol/L (ref 1.15–1.40)
Calcium, Ion: 0.96 mmol/L — ABNORMAL LOW (ref 1.15–1.40)
Calcium, Ion: 0.93 mmol/L — ABNORMAL LOW (ref 1.15–1.40)
Calcium, Ion: 1.18 mmol/L (ref 1.15–1.40)
Chloride: 104 mmol/L (ref 98–111)
Chloride: 106 mmol/L (ref 98–111)
Chloride: 100 mmol/L (ref 98–111)
Chloride: 101 mmol/L (ref 98–111)
Chloride: 103 mmol/L (ref 98–111)
Creatinine, Ser: 0.8 mg/dL (ref 0.61–1.24)
Creatinine, Ser: 0.9 mg/dL (ref 0.61–1.24)
Creatinine, Ser: 0.7 mg/dL (ref 0.61–1.24)
Creatinine, Ser: 0.7 mg/dL (ref 0.61–1.24)
Creatinine, Ser: 0.8 mg/dL (ref 0.61–1.24)
Glucose, Bld: 115 mg/dL — ABNORMAL HIGH (ref 70–99)
Glucose, Bld: 102 mg/dL — ABNORMAL HIGH (ref 70–99)
Glucose, Bld: 118 mg/dL — ABNORMAL HIGH (ref 70–99)
Glucose, Bld: 114 mg/dL — ABNORMAL HIGH (ref 70–99)
Glucose, Bld: 127 mg/dL — ABNORMAL HIGH (ref 70–99)
HCT: 41 % (ref 39.0–52.0)
HCT: 41 % (ref 39.0–52.0)
HCT: 31 % — ABNORMAL LOW (ref 39.0–52.0)
HCT: 26 % — ABNORMAL LOW (ref 39.0–52.0)
HCT: 26 % — ABNORMAL LOW (ref 39.0–52.0)
Hemoglobin: 13.9 g/dL (ref 13.0–17.0)
Hemoglobin: 13.9 g/dL (ref 13.0–17.0)
Hemoglobin: 10.5 g/dL — ABNORMAL LOW (ref 13.0–17.0)
Hemoglobin: 8.8 g/dL — ABNORMAL LOW (ref 13.0–17.0)
Hemoglobin: 8.8 g/dL — ABNORMAL LOW (ref 13.0–17.0)
Potassium: 3.9 mmol/L (ref 3.5–5.1)
Potassium: 4.2 mmol/L (ref 3.5–5.1)
Potassium: 4.7 mmol/L (ref 3.5–5.1)
Potassium: 4.4 mmol/L (ref 3.5–5.1)
Potassium: 4.9 mmol/L (ref 3.5–5.1)
Sodium: 140 mmol/L (ref 135–145)
Sodium: 140 mmol/L (ref 135–145)
Sodium: 134 mmol/L — ABNORMAL LOW (ref 135–145)
Sodium: 139 mmol/L (ref 135–145)
Sodium: 140 mmol/L (ref 135–145)
TCO2: 29 mmol/L (ref 22–32)
TCO2: 25 mmol/L (ref 22–32)
TCO2: 26 mmol/L (ref 22–32)
TCO2: 23 mmol/L (ref 22–32)
TCO2: 26 mmol/L (ref 22–32)

## 2021-05-08 LAB — GLUCOSE, CAPILLARY
Glucose-Capillary: 118 mg/dL — ABNORMAL HIGH (ref 70–99)
Glucose-Capillary: 118 mg/dL — ABNORMAL HIGH (ref 70–99)
Glucose-Capillary: 121 mg/dL — ABNORMAL HIGH (ref 70–99)
Glucose-Capillary: 111 mg/dL — ABNORMAL HIGH (ref 70–99)
Glucose-Capillary: 142 mg/dL — ABNORMAL HIGH (ref 70–99)
Glucose-Capillary: 146 mg/dL — ABNORMAL HIGH (ref 70–99)
Glucose-Capillary: 146 mg/dL — ABNORMAL HIGH (ref 70–99)
Glucose-Capillary: 130 mg/dL — ABNORMAL HIGH (ref 70–99)

## 2021-05-08 LAB — POCT I-STAT EG7
Acid-Base Excess: 9 mmol/L — ABNORMAL HIGH (ref 0.0–2.0)
Bicarbonate: 33.9 mmol/L — ABNORMAL HIGH (ref 20.0–28.0)
Calcium, Ion: 0.93 mmol/L — ABNORMAL LOW (ref 1.15–1.40)
HCT: 28 % — ABNORMAL LOW (ref 39.0–52.0)
Hemoglobin: 9.5 g/dL — ABNORMAL LOW (ref 13.0–17.0)
O2 Saturation: 77 %
Potassium: 3.6 mmol/L (ref 3.5–5.1)
Sodium: 145 mmol/L (ref 135–145)
TCO2: 35 mmol/L — ABNORMAL HIGH (ref 22–32)
pCO2, Ven: 49.3 mmHg (ref 44–60)
pH, Ven: 7.445 — ABNORMAL HIGH (ref 7.25–7.43)
pO2, Ven: 41 mmHg (ref 32–45)

## 2021-05-08 LAB — POCT I-STAT 7, (LYTES, BLD GAS, ICA,H+H)
Acid-Base Excess: 2 mmol/L (ref 0.0–2.0)
Acid-Base Excess: 1 mmol/L (ref 0.0–2.0)
Acid-base deficit: 1 mmol/L (ref 0.0–2.0)
Acid-base deficit: 2 mmol/L (ref 0.0–2.0)
Acid-base deficit: 2 mmol/L (ref 0.0–2.0)
Acid-base deficit: 3 mmol/L — ABNORMAL HIGH (ref 0.0–2.0)
Acid-base deficit: 4 mmol/L — ABNORMAL HIGH (ref 0.0–2.0)
Bicarbonate: 24.6 mmol/L (ref 20.0–28.0)
Bicarbonate: 26.2 mmol/L (ref 20.0–28.0)
Bicarbonate: 24.8 mmol/L (ref 20.0–28.0)
Bicarbonate: 22.2 mmol/L (ref 20.0–28.0)
Bicarbonate: 22.7 mmol/L (ref 20.0–28.0)
Bicarbonate: 21.7 mmol/L (ref 20.0–28.0)
Bicarbonate: 21.5 mmol/L (ref 20.0–28.0)
Calcium, Ion: 1.24 mmol/L (ref 1.15–1.40)
Calcium, Ion: 0.87 mmol/L — CL (ref 1.15–1.40)
Calcium, Ion: 0.97 mmol/L — ABNORMAL LOW (ref 1.15–1.40)
Calcium, Ion: 1.21 mmol/L (ref 1.15–1.40)
Calcium, Ion: 1.19 mmol/L (ref 1.15–1.40)
Calcium, Ion: 1.14 mmol/L — ABNORMAL LOW (ref 1.15–1.40)
Calcium, Ion: 1.1 mmol/L — ABNORMAL LOW (ref 1.15–1.40)
HCT: 42 % (ref 39.0–52.0)
HCT: 27 % — ABNORMAL LOW (ref 39.0–52.0)
HCT: 27 % — ABNORMAL LOW (ref 39.0–52.0)
HCT: 27 % — ABNORMAL LOW (ref 39.0–52.0)
HCT: 29 % — ABNORMAL LOW (ref 39.0–52.0)
HCT: 30 % — ABNORMAL LOW (ref 39.0–52.0)
HCT: 31 % — ABNORMAL LOW (ref 39.0–52.0)
Hemoglobin: 14.3 g/dL (ref 13.0–17.0)
Hemoglobin: 9.2 g/dL — ABNORMAL LOW (ref 13.0–17.0)
Hemoglobin: 9.2 g/dL — ABNORMAL LOW (ref 13.0–17.0)
Hemoglobin: 9.2 g/dL — ABNORMAL LOW (ref 13.0–17.0)
Hemoglobin: 9.9 g/dL — ABNORMAL LOW (ref 13.0–17.0)
Hemoglobin: 10.2 g/dL — ABNORMAL LOW (ref 13.0–17.0)
Hemoglobin: 10.5 g/dL — ABNORMAL LOW (ref 13.0–17.0)
O2 Saturation: 100 %
O2 Saturation: 100 %
O2 Saturation: 100 %
O2 Saturation: 97 %
O2 Saturation: 97 %
O2 Saturation: 95 %
O2 Saturation: 97 %
Patient temperature: 36.4
Patient temperature: 36.3
Patient temperature: 36.5
Potassium: 3.9 mmol/L (ref 3.5–5.1)
Potassium: 4.5 mmol/L (ref 3.5–5.1)
Potassium: 5.3 mmol/L — ABNORMAL HIGH (ref 3.5–5.1)
Potassium: 4.4 mmol/L (ref 3.5–5.1)
Potassium: 3.9 mmol/L (ref 3.5–5.1)
Potassium: 4.3 mmol/L (ref 3.5–5.1)
Potassium: 4 mmol/L (ref 3.5–5.1)
Sodium: 139 mmol/L (ref 135–145)
Sodium: 140 mmol/L (ref 135–145)
Sodium: 136 mmol/L (ref 135–145)
Sodium: 139 mmol/L (ref 135–145)
Sodium: 141 mmol/L (ref 135–145)
Sodium: 139 mmol/L (ref 135–145)
Sodium: 140 mmol/L (ref 135–145)
TCO2: 26 mmol/L (ref 22–32)
TCO2: 27 mmol/L (ref 22–32)
TCO2: 26 mmol/L (ref 22–32)
TCO2: 23 mmol/L (ref 22–32)
TCO2: 24 mmol/L (ref 22–32)
TCO2: 23 mmol/L (ref 22–32)
TCO2: 23 mmol/L (ref 22–32)
pCO2 arterial: 42.5 mmHg (ref 32–48)
pCO2 arterial: 38.5 mmHg (ref 32–48)
pCO2 arterial: 34.8 mmHg (ref 32–48)
pCO2 arterial: 35.4 mmHg (ref 32–48)
pCO2 arterial: 38.3 mmHg (ref 32–48)
pCO2 arterial: 37.4 mmHg (ref 32–48)
pCO2 arterial: 38.2 mmHg (ref 32–48)
pH, Arterial: 7.372 (ref 7.35–7.45)
pH, Arterial: 7.441 (ref 7.35–7.45)
pH, Arterial: 7.461 — ABNORMAL HIGH (ref 7.35–7.45)
pH, Arterial: 7.405 (ref 7.35–7.45)
pH, Arterial: 7.377 (ref 7.35–7.45)
pH, Arterial: 7.369 (ref 7.35–7.45)
pH, Arterial: 7.355 (ref 7.35–7.45)
pO2, Arterial: 380 mmHg — ABNORMAL HIGH (ref 83–108)
pO2, Arterial: 423 mmHg — ABNORMAL HIGH (ref 83–108)
pO2, Arterial: 365 mmHg — ABNORMAL HIGH (ref 83–108)
pO2, Arterial: 90 mmHg (ref 83–108)
pO2, Arterial: 88 mmHg (ref 83–108)
pO2, Arterial: 76 mmHg — ABNORMAL LOW (ref 83–108)
pO2, Arterial: 94 mmHg (ref 83–108)

## 2021-05-08 LAB — BASIC METABOLIC PANEL
Anion gap: 10 (ref 5–15)
Anion gap: 6 (ref 5–15)
BUN: 16 mg/dL (ref 8–23)
BUN: 12 mg/dL (ref 8–23)
CO2: 22 mmol/L (ref 22–32)
CO2: 23 mmol/L (ref 22–32)
Calcium: 8.6 mg/dL — ABNORMAL LOW (ref 8.9–10.3)
Calcium: 8 mg/dL — ABNORMAL LOW (ref 8.9–10.3)
Chloride: 104 mmol/L (ref 98–111)
Chloride: 109 mmol/L (ref 98–111)
Creatinine, Ser: 1.08 mg/dL (ref 0.61–1.24)
Creatinine, Ser: 0.94 mg/dL (ref 0.61–1.24)
GFR, Estimated: >60 mL/min (ref >60)
GFR, Estimated: >60 mL/min (ref >60)
Glucose, Bld: 112 mg/dL — ABNORMAL HIGH (ref 70–99)
Glucose, Bld: 136 mg/dL — ABNORMAL HIGH (ref 70–99)
Potassium: 3.7 mmol/L (ref 3.5–5.1)
Potassium: 4.1 mmol/L (ref 3.5–5.1)
Sodium: 136 mmol/L (ref 135–145)
Sodium: 138 mmol/L (ref 135–145)

## 2021-05-08 LAB — CBC
HCT: 45.1 % (ref 39.0–52.0)
HCT: 32 % — ABNORMAL LOW (ref 39.0–52.0)
HCT: 34.9 % — ABNORMAL LOW (ref 39.0–52.0)
Hemoglobin: 15 g/dL (ref 13.0–17.0)
Hemoglobin: 10.8 g/dL — ABNORMAL LOW (ref 13.0–17.0)
Hemoglobin: 12.1 g/dL — ABNORMAL LOW (ref 13.0–17.0)
MCH: 29.5 pg (ref 26.0–34.0)
MCH: 29.8 pg (ref 26.0–34.0)
MCH: 30.5 pg (ref 26.0–34.0)
MCHC: 33.3 g/dL (ref 30.0–36.0)
MCHC: 33.8 g/dL (ref 30.0–36.0)
MCHC: 34.7 g/dL (ref 30.0–36.0)
MCV: 88.8 fL (ref 80.0–100.0)
MCV: 88.4 fL (ref 80.0–100.0)
MCV: 87.9 fL (ref 80.0–100.0)
Platelets: 120 10*3/uL — ABNORMAL LOW (ref 150–400)
Platelets: 71 10*3/uL — ABNORMAL LOW (ref 150–400)
Platelets: 93 10*3/uL — ABNORMAL LOW (ref 150–400)
RBC: 5.08 MIL/uL (ref 4.22–5.81)
RBC: 3.62 MIL/uL — ABNORMAL LOW (ref 4.22–5.81)
RBC: 3.97 MIL/uL — ABNORMAL LOW (ref 4.22–5.81)
RDW: 13.2 % (ref 11.5–15.5)
RDW: 13.1 % (ref 11.5–15.5)
RDW: 13.2 % (ref 11.5–15.5)
WBC: 8.4 10*3/uL (ref 4.0–10.5)
WBC: 6.6 10*3/uL (ref 4.0–10.5)
WBC: 8.1 10*3/uL (ref 4.0–10.5)
nRBC: 0 % (ref 0.0–0.2)
nRBC: 0 % (ref 0.0–0.2)
nRBC: 0 % (ref 0.0–0.2)

## 2021-05-08 LAB — HEMOGLOBIN AND HEMATOCRIT, BLOOD
HCT: 31.2 % — ABNORMAL LOW (ref 39.0–52.0)
Hemoglobin: 10.7 g/dL — ABNORMAL LOW (ref 13.0–17.0)

## 2021-05-08 LAB — PROTIME-INR
INR: 1.5 — ABNORMAL HIGH (ref 0.8–1.2)
Prothrombin Time: 17.5 seconds — ABNORMAL HIGH (ref 11.4–15.2)

## 2021-05-08 LAB — URINALYSIS, ROUTINE W REFLEX MICROSCOPIC
Bilirubin Urine: NEGATIVE
Glucose, UA: NEGATIVE mg/dL
Hgb urine dipstick: NEGATIVE
Ketones, ur: NEGATIVE mg/dL
Leukocytes,Ua: NEGATIVE
Nitrite: NEGATIVE
Protein, ur: NEGATIVE mg/dL
Specific Gravity, Urine: 1.02 (ref 1.005–1.030)
pH: 6 (ref 5.0–8.0)

## 2021-05-08 LAB — APTT: aPTT: 32 seconds (ref 24–36)

## 2021-05-08 LAB — HEMOGLOBIN A1C
Hgb A1c MFr Bld: 6.1 % — ABNORMAL HIGH (ref 4.8–5.6)
Mean Plasma Glucose: 128.37 mg/dL

## 2021-05-08 LAB — MAGNESIUM: Magnesium: 2.8 mg/dL — ABNORMAL HIGH (ref 1.7–2.4)

## 2021-05-08 LAB — PLATELET COUNT: Platelets: 89 10*3/uL — ABNORMAL LOW (ref 150–400)

## 2021-05-08 LAB — HEPARIN LEVEL (UNFRACTIONATED): Heparin Unfractionated: 0.35 IU/mL (ref 0.30–0.70)

## 2021-05-08 SURGERY — CORONARY ARTERY BYPASS GRAFTING (CABG)
Anesthesia: General | Site: Chest

## 2021-05-08 MED ORDER — TRANEXAMIC ACID (OHS) BOLUS VIA INFUSION
15.0000 mg/kg | INTRAVENOUS | Status: AC
  Administered 2021-05-08: 1105.5 mg via INTRAVENOUS
  Filled 2021-05-08: qty 1106

## 2021-05-08 MED ORDER — NITROGLYCERIN IN D5W 200-5 MCG/ML-% IV SOLN
2.0000 ug/min | INTRAVENOUS | Status: DC
  Filled 2021-05-08: qty 250

## 2021-05-08 MED ORDER — CHLORHEXIDINE GLUCONATE 0.12% ORAL RINSE (MEDLINE KIT): 15.0000 mL | Freq: Two times a day (BID) | OROMUCOSAL | Status: DC

## 2021-05-08 MED ORDER — PLASMA-LYTE A IV SOLN
INTRAVENOUS | Status: DC | PRN
  Administered 2021-05-08: 500 mL via INTRAVASCULAR

## 2021-05-08 MED ORDER — TRANEXAMIC ACID (OHS) PUMP PRIME SOLUTION
2.0000 mg/kg | INTRAVENOUS | Status: DC
  Filled 2021-05-08: qty 1.47

## 2021-05-08 MED ORDER — METOPROLOL TARTRATE 25 MG/10 ML ORAL SUSPENSION: 12.5000 mg | Freq: Two times a day (BID) | ORAL | Status: DC

## 2021-05-08 MED ORDER — DEXTROSE 50 % IV SOLN: 0.0000 mL | INTRAVENOUS | Status: DC | PRN

## 2021-05-08 MED ORDER — OXYCODONE HCL 5 MG PO TABS
5.0000 mg | ORAL_TABLET | ORAL | Status: DC | PRN
  Administered 2021-05-08: 10 mg via ORAL
  Administered 2021-05-10: 5 mg via ORAL
  Filled 2021-05-08: qty 1
  Filled 2021-05-08: qty 2

## 2021-05-08 MED ORDER — GLYCOPYRROLATE PF 0.2 MG/ML IJ SOSY
PREFILLED_SYRINGE | INTRAMUSCULAR | Status: AC
  Filled 2021-05-08: qty 3

## 2021-05-08 MED ORDER — MIDAZOLAM HCL (PF) 10 MG/2ML IJ SOLN
INTRAMUSCULAR | Status: AC
  Filled 2021-05-08: qty 2

## 2021-05-08 MED ORDER — METOPROLOL TARTRATE 5 MG/5ML IV SOLN: 2.5000 mg | INTRAVENOUS | Status: DC | PRN

## 2021-05-08 MED ORDER — BISACODYL 5 MG PO TBEC
10.0000 mg | DELAYED_RELEASE_TABLET | Freq: Every day | ORAL | Status: DC
  Administered 2021-05-09 – 2021-05-11 (×3): 10 mg via ORAL
  Filled 2021-05-08 (×4): qty 2

## 2021-05-08 MED ORDER — THIAMINE HCL 100 MG PO TABS
100.0000 mg | ORAL_TABLET | Freq: Every day | ORAL | Status: DC
  Administered 2021-05-09 – 2021-05-12 (×4): 100 mg
  Filled 2021-05-08 (×4): qty 1

## 2021-05-08 MED ORDER — LACTATED RINGERS IV SOLN: INTRAVENOUS | Status: DC | PRN

## 2021-05-08 MED ORDER — THIAMINE HCL 100 MG PO TABS: 100.0000 mg | ORAL_TABLET | Freq: Every day | ORAL | Status: DC

## 2021-05-08 MED ORDER — VANCOMYCIN HCL 1250 MG/250ML IV SOLN
1250.0000 mg | INTRAVENOUS | Status: AC
  Administered 2021-05-08: 1250 mg via INTRAVENOUS
  Filled 2021-05-08: qty 250

## 2021-05-08 MED ORDER — PHENYLEPHRINE HCL-NACL 20-0.9 MG/250ML-% IV SOLN
30.0000 ug/min | INTRAVENOUS | Status: DC
  Filled 2021-05-08: qty 250

## 2021-05-08 MED ORDER — NICARDIPINE HCL IN NACL 20-0.86 MG/200ML-% IV SOLN
0.0000 mg/h | INTRAVENOUS | Status: DC
  Administered 2021-05-08: 5 mg/h via INTRAVENOUS
  Filled 2021-05-08: qty 200

## 2021-05-08 MED ORDER — POTASSIUM CHLORIDE 2 MEQ/ML IV SOLN
80.0000 meq | INTRAVENOUS | Status: DC
  Filled 2021-05-08: qty 40

## 2021-05-08 MED ORDER — ACETAMINOPHEN 160 MG/5ML PO SOLN: 1000.0000 mg | Freq: Four times a day (QID) | ORAL | Status: DC

## 2021-05-08 MED ORDER — ALBUMIN HUMAN 5 % IV SOLN: INTRAVENOUS | Status: DC | PRN

## 2021-05-08 MED ORDER — ACETAMINOPHEN 650 MG RE SUPP
650.0000 mg | Freq: Once | RECTAL | Status: AC
  Administered 2021-05-08: 650 mg via RECTAL

## 2021-05-08 MED ORDER — TRANEXAMIC ACID (OHS) BOLUS VIA INFUSION
15.0000 mg/kg | INTRAVENOUS | Status: DC
Start: 2021-05-09 — End: 2021-05-08
  Filled 2021-05-08: qty 1115

## 2021-05-08 MED ORDER — INSULIN REGULAR(HUMAN) IN NACL 100-0.9 UT/100ML-% IV SOLN
INTRAVENOUS | Status: DC
  Filled 2021-05-08: qty 100

## 2021-05-08 MED ORDER — VANCOMYCIN HCL IN DEXTROSE 1-5 GM/200ML-% IV SOLN
1000.0000 mg | Freq: Once | INTRAVENOUS | Status: AC
  Administered 2021-05-08: 1000 mg via INTRAVENOUS
  Filled 2021-05-08: qty 200

## 2021-05-08 MED ORDER — INSULIN REGULAR(HUMAN) IN NACL 100-0.9 UT/100ML-% IV SOLN
INTRAVENOUS | Status: AC
  Administered 2021-05-08: .9 [IU]/h via INTRAVENOUS
  Filled 2021-05-08: qty 100

## 2021-05-08 MED ORDER — AMLODIPINE BESYLATE 5 MG PO TABS: 5.0000 mg | ORAL_TABLET | Freq: Every day | ORAL | Status: DC

## 2021-05-08 MED ORDER — FOLIC ACID 1 MG PO TABS
1.0000 mg | ORAL_TABLET | Freq: Every day | ORAL | Status: DC
  Administered 2021-05-09 – 2021-05-12 (×4): 1 mg
  Filled 2021-05-08 (×4): qty 1

## 2021-05-08 MED ORDER — DEXMEDETOMIDINE HCL IN NACL 400 MCG/100ML IV SOLN: 0.0000 ug/kg/h | INTRAVENOUS | Status: DC

## 2021-05-08 MED ORDER — FENTANYL CITRATE (PF) 250 MCG/5ML IJ SOLN
INTRAMUSCULAR | Status: AC
  Filled 2021-05-08: qty 5

## 2021-05-08 MED ORDER — SODIUM CHLORIDE 0.9 % IV SOLN: 250.0000 mL | INTRAVENOUS | Status: DC

## 2021-05-08 MED ORDER — LACTATED RINGERS IV SOLN: INTRAVENOUS | Status: DC

## 2021-05-08 MED ORDER — ASPIRIN EC 81 MG PO TBEC: 81.0000 mg | DELAYED_RELEASE_TABLET | Freq: Every day | ORAL | Status: DC

## 2021-05-08 MED ORDER — PHENYLEPHRINE HCL-NACL 20-0.9 MG/250ML-% IV SOLN
30.0000 ug/min | INTRAVENOUS | Status: AC
  Administered 2021-05-08: 25 ug/min via INTRAVENOUS
  Filled 2021-05-08: qty 250

## 2021-05-08 MED ORDER — MIDAZOLAM HCL 2 MG/2ML IJ SOLN: 2.0000 mg | INTRAMUSCULAR | Status: DC | PRN

## 2021-05-08 MED ORDER — BISACODYL 10 MG RE SUPP: 10.0000 mg | Freq: Every day | RECTAL | Status: DC

## 2021-05-08 MED ORDER — PANTOPRAZOLE SODIUM 40 MG PO TBEC
40.0000 mg | DELAYED_RELEASE_TABLET | Freq: Every day | ORAL | Status: DC
  Administered 2021-05-10 – 2021-05-13 (×4): 40 mg via ORAL
  Filled 2021-05-08 (×4): qty 1

## 2021-05-08 MED ORDER — PROPOFOL 10 MG/ML IV BOLUS
INTRAVENOUS | Status: DC | PRN
  Administered 2021-05-08: 100 mg via INTRAVENOUS
  Administered 2021-05-08: 20 mg via INTRAVENOUS
  Administered 2021-05-08: 50 mg via INTRAVENOUS
  Administered 2021-05-08: 30 mg via INTRAVENOUS

## 2021-05-08 MED ORDER — ONDANSETRON HCL 4 MG/2ML IJ SOLN
4.0000 mg | Freq: Four times a day (QID) | INTRAMUSCULAR | Status: DC | PRN
  Administered 2021-05-08 – 2021-05-09 (×2): 4 mg via INTRAVENOUS
  Filled 2021-05-08 (×2): qty 2

## 2021-05-08 MED ORDER — SODIUM CHLORIDE 0.9% FLUSH: 10.0000 mL | INTRAVENOUS | Status: DC | PRN

## 2021-05-08 MED ORDER — MAGNESIUM SULFATE 4 GM/100ML IV SOLN
4.0000 g | Freq: Once | INTRAVENOUS | Status: AC
  Administered 2021-05-08: 4 g via INTRAVENOUS
  Filled 2021-05-08: qty 100

## 2021-05-08 MED ORDER — ACETAMINOPHEN 500 MG PO TABS
1000.0000 mg | ORAL_TABLET | Freq: Four times a day (QID) | ORAL | Status: DC
  Administered 2021-05-08 – 2021-05-13 (×18): 1000 mg via ORAL
  Filled 2021-05-08 (×18): qty 2

## 2021-05-08 MED ORDER — EPINEPHRINE HCL 5 MG/250ML IV SOLN IN NS
0.0000 ug/min | INTRAVENOUS | Status: DC
  Filled 2021-05-08: qty 250

## 2021-05-08 MED ORDER — MAGNESIUM SULFATE 50 % IJ SOLN
40.0000 meq | INTRAMUSCULAR | Status: DC
  Filled 2021-05-08: qty 9.85

## 2021-05-08 MED ORDER — HEPARIN SODIUM (PORCINE) 1000 UNIT/ML IJ SOLN
INTRAMUSCULAR | Status: AC
  Filled 2021-05-08: qty 1

## 2021-05-08 MED ORDER — PHENYLEPHRINE 80 MCG/ML (10ML) SYRINGE FOR IV PUSH (FOR BLOOD PRESSURE SUPPORT)
PREFILLED_SYRINGE | INTRAVENOUS | Status: AC
  Filled 2021-05-08: qty 10

## 2021-05-08 MED ORDER — SODIUM CHLORIDE 0.9 % WEIGHT BASED INFUSION: 3.0000 mL/kg/h | INTRAVENOUS | Status: DC

## 2021-05-08 MED ORDER — ALPRAZOLAM 0.25 MG PO TABS: 0.2500 mg | ORAL_TABLET | Freq: Two times a day (BID) | ORAL | Status: DC | PRN

## 2021-05-08 MED ORDER — TRAMADOL HCL 50 MG PO TABS
50.0000 mg | ORAL_TABLET | ORAL | Status: DC | PRN
  Administered 2021-05-08: 100 mg via ORAL
  Filled 2021-05-08: qty 2

## 2021-05-08 MED ORDER — HEPARIN (PORCINE) 25000 UT/250ML-% IV SOLN: 1000.0000 [IU]/h | INTRAVENOUS | Status: DC

## 2021-05-08 MED ORDER — CEFAZOLIN SODIUM-DEXTROSE 2-3 GM-%(50ML) IV SOLR
INTRAVENOUS | Status: DC | PRN
Start: 2021-05-08 — End: 2021-05-08

## 2021-05-08 MED ORDER — METOPROLOL TARTRATE 12.5 MG HALF TABLET
12.5000 mg | ORAL_TABLET | Freq: Two times a day (BID) | ORAL | Status: DC
  Administered 2021-05-09 – 2021-05-10 (×3): 12.5 mg via ORAL
  Filled 2021-05-08 (×3): qty 1

## 2021-05-08 MED ORDER — NITROGLYCERIN IN D5W 200-5 MCG/ML-% IV SOLN: 0.0000 ug/min | INTRAVENOUS | Status: DC

## 2021-05-08 MED ORDER — CEFAZOLIN SODIUM-DEXTROSE 2-4 GM/100ML-% IV SOLN
2.0000 g | Freq: Three times a day (TID) | INTRAVENOUS | Status: AC
  Administered 2021-05-08 – 2021-05-10 (×6): 2 g via INTRAVENOUS
  Filled 2021-05-08 (×6): qty 100

## 2021-05-08 MED ORDER — VANCOMYCIN HCL 1250 MG/250ML IV SOLN
1250.0000 mg | INTRAVENOUS | Status: DC
  Filled 2021-05-08: qty 250

## 2021-05-08 MED ORDER — NOREPINEPHRINE 4 MG/250ML-% IV SOLN
0.0000 ug/min | INTRAVENOUS | Status: DC
  Filled 2021-05-08: qty 250

## 2021-05-08 MED ORDER — SODIUM CHLORIDE 0.9% FLUSH
10.0000 mL | Freq: Two times a day (BID) | INTRAVENOUS | Status: DC
  Administered 2021-05-08 – 2021-05-12 (×6): 10 mL

## 2021-05-08 MED ORDER — LOSARTAN POTASSIUM 50 MG PO TABS
50.0000 mg | ORAL_TABLET | Freq: Every day | ORAL | Status: DC
Start: 2021-05-08 — End: 2021-05-08

## 2021-05-08 MED ORDER — ASPIRIN 81 MG PO CHEW: 324.0000 mg | CHEWABLE_TABLET | Freq: Every day | ORAL | Status: DC

## 2021-05-08 MED ORDER — HEPARIN 30,000 UNITS/1000 ML (OHS) CELLSAVER SOLUTION
Status: DC
  Filled 2021-05-08: qty 1000

## 2021-05-08 MED ORDER — CHLORHEXIDINE GLUCONATE 0.12 % MT SOLN
15.0000 mL | OROMUCOSAL | Status: AC
  Administered 2021-05-08: 15 mL via OROMUCOSAL

## 2021-05-08 MED ORDER — MIDAZOLAM HCL (PF) 5 MG/ML IJ SOLN
INTRAMUSCULAR | Status: DC | PRN
  Administered 2021-05-08: 2 mg via INTRAVENOUS
  Administered 2021-05-08: 3 mg via INTRAVENOUS
  Administered 2021-05-08: 2 mg via INTRAVENOUS

## 2021-05-08 MED ORDER — MILRINONE LACTATE IN DEXTROSE 20-5 MG/100ML-% IV SOLN
0.3000 ug/kg/min | INTRAVENOUS | Status: DC
  Filled 2021-05-08: qty 100

## 2021-05-08 MED ORDER — FOLIC ACID 1 MG PO TABS: 1.0000 mg | ORAL_TABLET | Freq: Every day | ORAL | Status: DC

## 2021-05-08 MED ORDER — ORAL CARE MOUTH RINSE
15.0000 mL | OROMUCOSAL | Status: DC
  Administered 2021-05-08 (×2): 15 mL via OROMUCOSAL

## 2021-05-08 MED ORDER — SODIUM CHLORIDE 0.45 % IV SOLN: INTRAVENOUS | Status: DC | PRN

## 2021-05-08 MED ORDER — MORPHINE SULFATE (PF) 2 MG/ML IV SOLN
1.0000 mg | INTRAVENOUS | Status: DC | PRN
  Administered 2021-05-09: 2 mg via INTRAVENOUS
  Filled 2021-05-08: qty 1

## 2021-05-08 MED ORDER — ROCURONIUM BROMIDE 10 MG/ML (PF) SYRINGE
PREFILLED_SYRINGE | INTRAVENOUS | Status: AC
  Filled 2021-05-08: qty 10

## 2021-05-08 MED ORDER — LACTATED RINGERS IV SOLN: 500.0000 mL | Freq: Once | INTRAVENOUS | Status: DC | PRN

## 2021-05-08 MED ORDER — MANNITOL 20 % IV SOLN
INTRAVENOUS | Status: DC
  Filled 2021-05-08: qty 13

## 2021-05-08 MED ORDER — 0.9 % SODIUM CHLORIDE (POUR BTL) OPTIME
TOPICAL | Status: DC | PRN
  Administered 2021-05-08: 5000 mL

## 2021-05-08 MED ORDER — TRANEXAMIC ACID 1000 MG/10ML IV SOLN
1.5000 mg/kg/h | INTRAVENOUS | Status: DC
  Filled 2021-05-08: qty 25

## 2021-05-08 MED ORDER — CEFAZOLIN SODIUM-DEXTROSE 2-4 GM/100ML-% IV SOLN
2.0000 g | INTRAVENOUS | Status: AC
  Administered 2021-05-08 (×2): 2 g via INTRAVENOUS

## 2021-05-08 MED ORDER — MILRINONE LACTATE IN DEXTROSE 20-5 MG/100ML-% IV SOLN
0.3000 ug/kg/min | INTRAVENOUS | Status: DC
Start: 2021-05-08 — End: 2021-05-08
  Filled 2021-05-08: qty 100

## 2021-05-08 MED ORDER — SODIUM CHLORIDE (PF) 0.9 % IJ SOLN
INTRAMUSCULAR | Status: DC | PRN
  Administered 2021-05-08: 8 mL via TOPICAL

## 2021-05-08 MED ORDER — PLASMA-LYTE A IV SOLN
INTRAVENOUS | Status: DC
  Filled 2021-05-08: qty 2.5

## 2021-05-08 MED ORDER — TRANEXAMIC ACID 1000 MG/10ML IV SOLN
1.5000 mg/kg/h | INTRAVENOUS | Status: AC
  Administered 2021-05-08: 1.5 mg/kg/h via INTRAVENOUS
  Filled 2021-05-08: qty 25

## 2021-05-08 MED ORDER — CEFAZOLIN SODIUM-DEXTROSE 2-4 GM/100ML-% IV SOLN: 2.0000 g | INTRAVENOUS | Status: DC

## 2021-05-08 MED ORDER — PROPOFOL 10 MG/ML IV BOLUS
INTRAVENOUS | Status: AC
  Filled 2021-05-08: qty 20

## 2021-05-08 MED ORDER — HEPARIN SODIUM (PORCINE) 1000 UNIT/ML IJ SOLN
INTRAMUSCULAR | Status: DC | PRN
  Administered 2021-05-08: 27000 [IU] via INTRAVENOUS

## 2021-05-08 MED ORDER — ACETAMINOPHEN 160 MG/5ML PO SOLN: 650.0000 mg | Freq: Once | ORAL | Status: AC

## 2021-05-08 MED ORDER — NITROGLYCERIN 0.4 MG SL SUBL: 0.4000 mg | SUBLINGUAL_TABLET | SUBLINGUAL | Status: DC | PRN

## 2021-05-08 MED ORDER — ATORVASTATIN CALCIUM 80 MG PO TABS: 80.0000 mg | ORAL_TABLET | Freq: Every day | ORAL | Status: DC

## 2021-05-08 MED ORDER — PANTOPRAZOLE SODIUM 40 MG PO TBEC: 40.0000 mg | DELAYED_RELEASE_TABLET | Freq: Every day | ORAL | Status: DC

## 2021-05-08 MED ORDER — ONDANSETRON HCL 4 MG/2ML IJ SOLN
INTRAMUSCULAR | Status: DC | PRN
  Administered 2021-05-08: 4 mg via INTRAVENOUS

## 2021-05-08 MED ORDER — TRANEXAMIC ACID (OHS) PUMP PRIME SOLUTION
2.0000 mg/kg | INTRAVENOUS | Status: DC
  Filled 2021-05-08: qty 1.49

## 2021-05-08 MED ORDER — ALBUMIN HUMAN 5 % IV SOLN
250.0000 mL | INTRAVENOUS | Status: AC | PRN
  Administered 2021-05-08: 12.5 g via INTRAVENOUS

## 2021-05-08 MED ORDER — ROCURONIUM BROMIDE 10 MG/ML (PF) SYRINGE
PREFILLED_SYRINGE | INTRAVENOUS | Status: DC | PRN
  Administered 2021-05-08: 40 mg via INTRAVENOUS
  Administered 2021-05-08: 30 mg via INTRAVENOUS
  Administered 2021-05-08: 60 mg via INTRAVENOUS
  Administered 2021-05-08 (×2): 50 mg via INTRAVENOUS

## 2021-05-08 MED ORDER — CEFAZOLIN SODIUM-DEXTROSE 2-4 GM/100ML-% IV SOLN
2.0000 g | INTRAVENOUS | Status: DC
  Filled 2021-05-08: qty 100

## 2021-05-08 MED ORDER — DOCUSATE SODIUM 100 MG PO CAPS
200.0000 mg | ORAL_CAPSULE | Freq: Every day | ORAL | Status: DC
  Administered 2021-05-09 – 2021-05-11 (×3): 200 mg via ORAL
  Filled 2021-05-08 (×4): qty 2

## 2021-05-08 MED ORDER — ASPIRIN EC 325 MG PO TBEC
325.0000 mg | DELAYED_RELEASE_TABLET | Freq: Every day | ORAL | Status: DC
  Administered 2021-05-09 – 2021-05-13 (×5): 325 mg via ORAL
  Filled 2021-05-08 (×5): qty 1

## 2021-05-08 MED ORDER — ATORVASTATIN CALCIUM 80 MG PO TABS
80.0000 mg | ORAL_TABLET | Freq: Every day | ORAL | Status: DC
  Administered 2021-05-09 – 2021-05-13 (×5): 80 mg via ORAL
  Filled 2021-05-08 (×5): qty 1

## 2021-05-08 MED ORDER — PROTAMINE SULFATE 10 MG/ML IV SOLN
INTRAVENOUS | Status: AC
  Filled 2021-05-08: qty 25

## 2021-05-08 MED ORDER — PHENYLEPHRINE HCL-NACL 20-0.9 MG/250ML-% IV SOLN: 0.0000 ug/min | INTRAVENOUS | Status: DC

## 2021-05-08 MED ORDER — DEXMEDETOMIDINE HCL IN NACL 400 MCG/100ML IV SOLN
0.1000 ug/kg/h | INTRAVENOUS | Status: AC
  Administered 2021-05-08: .5 ug/kg/h via INTRAVENOUS
  Filled 2021-05-08: qty 100

## 2021-05-08 MED ORDER — PAPAVERINE HCL 30 MG/ML IJ SOLN
INTRAMUSCULAR | Status: DC
  Filled 2021-05-08: qty 2.5

## 2021-05-08 MED ORDER — ONDANSETRON HCL 4 MG/2ML IJ SOLN: 4.0000 mg | Freq: Four times a day (QID) | INTRAMUSCULAR | Status: DC | PRN

## 2021-05-08 MED ORDER — ACETAMINOPHEN 325 MG PO TABS: 650.0000 mg | ORAL_TABLET | ORAL | Status: DC | PRN

## 2021-05-08 MED ORDER — MONTELUKAST SODIUM 10 MG PO TABS: 10.0000 mg | ORAL_TABLET | Freq: Every day | ORAL | Status: DC

## 2021-05-08 MED ORDER — PROTAMINE SULFATE 10 MG/ML IV SOLN
INTRAVENOUS | Status: DC | PRN
  Administered 2021-05-08: 20 mg via INTRAVENOUS
  Administered 2021-05-08: 250 mg via INTRAVENOUS

## 2021-05-08 MED ORDER — GLYCOPYRROLATE 0.2 MG/ML IJ SOLN
INTRAMUSCULAR | Status: DC | PRN
  Administered 2021-05-08 (×2): .2 mg via INTRAVENOUS

## 2021-05-08 MED ORDER — NITROGLYCERIN IN D5W 200-5 MCG/ML-% IV SOLN
2.0000 ug/min | INTRAVENOUS | Status: AC
  Administered 2021-05-08: 10 ug/min via INTRAVENOUS
  Filled 2021-05-08: qty 250

## 2021-05-08 MED ORDER — FENTANYL CITRATE (PF) 250 MCG/5ML IJ SOLN
INTRAMUSCULAR | Status: DC | PRN
  Administered 2021-05-08: 200 ug via INTRAVENOUS
  Administered 2021-05-08: 50 ug via INTRAVENOUS
  Administered 2021-05-08: 100 ug via INTRAVENOUS
  Administered 2021-05-08: 150 ug via INTRAVENOUS
  Administered 2021-05-08 (×5): 100 ug via INTRAVENOUS
  Administered 2021-05-08 (×2): 50 ug via INTRAVENOUS
  Administered 2021-05-08: 150 ug via INTRAVENOUS

## 2021-05-08 MED ORDER — POTASSIUM CHLORIDE 10 MEQ/50ML IV SOLN
10.0000 meq | INTRAVENOUS | Status: AC
  Administered 2021-05-08 (×3): 10 meq via INTRAVENOUS

## 2021-05-08 MED ORDER — INSULIN REGULAR(HUMAN) IN NACL 100-0.9 UT/100ML-% IV SOLN: INTRAVENOUS | Status: DC

## 2021-05-08 MED ORDER — ORAL CARE MOUTH RINSE
15.0000 mL | Freq: Two times a day (BID) | OROMUCOSAL | Status: DC
  Administered 2021-05-09 – 2021-05-12 (×5): 15 mL via OROMUCOSAL

## 2021-05-08 MED ORDER — SODIUM CHLORIDE 0.9 % IV SOLN: INTRAVENOUS | Status: DC

## 2021-05-08 MED ORDER — FAMOTIDINE IN NACL 20-0.9 MG/50ML-% IV SOLN
20.0000 mg | Freq: Two times a day (BID) | INTRAVENOUS | Status: DC
  Administered 2021-05-08: 20 mg via INTRAVENOUS
  Filled 2021-05-08: qty 50

## 2021-05-08 MED ORDER — SODIUM CHLORIDE 0.9% FLUSH
3.0000 mL | Freq: Two times a day (BID) | INTRAVENOUS | Status: DC
  Administered 2021-05-09 – 2021-05-12 (×4): 3 mL via INTRAVENOUS

## 2021-05-08 MED ORDER — PHENYLEPHRINE 80 MCG/ML (10ML) SYRINGE FOR IV PUSH (FOR BLOOD PRESSURE SUPPORT)
PREFILLED_SYRINGE | INTRAVENOUS | Status: DC | PRN
  Administered 2021-05-08: 80 ug via INTRAVENOUS

## 2021-05-08 MED ORDER — SODIUM CHLORIDE 0.9% FLUSH: 3.0000 mL | INTRAVENOUS | Status: DC | PRN

## 2021-05-08 MED ORDER — CHLORHEXIDINE GLUCONATE CLOTH 2 % EX PADS
6.0000 | MEDICATED_PAD | Freq: Every day | CUTANEOUS | Status: DC
  Administered 2021-05-08 – 2021-05-12 (×5): 6 via TOPICAL

## 2021-05-08 MED ORDER — LACTATED RINGERS IV SOLN
INTRAVENOUS | Status: DC | PRN
Start: 2021-05-08 — End: 2021-05-08

## 2021-05-08 MED ORDER — DEXMEDETOMIDINE HCL IN NACL 400 MCG/100ML IV SOLN
0.1000 ug/kg/h | INTRAVENOUS | Status: DC
  Filled 2021-05-08: qty 100

## 2021-05-08 SURGICAL SUPPLY — 92 items
ATRICLIP EXCLUSION VLAA 45 (Miscellaneous) ×5 IMPLANT
BAG DECANTER FOR FLEXI CONT (MISCELLANEOUS) ×5 IMPLANT
BLADE CLIPPER SURG (BLADE) ×5 IMPLANT
BLADE STERNUM SYSTEM 6 (BLADE) ×5 IMPLANT
BLADE SURG 11 STRL SS (BLADE) ×1 IMPLANT
BNDG ELASTIC 4X5.8 VLCR STR LF (GAUZE/BANDAGES/DRESSINGS) ×5 IMPLANT
BNDG ELASTIC 6X5.8 VLCR STR LF (GAUZE/BANDAGES/DRESSINGS) ×5 IMPLANT
BNDG GAUZE ELAST 4 BULKY (GAUZE/BANDAGES/DRESSINGS) ×5 IMPLANT
CABLE SURGICAL S-101-97-12 (CABLE) ×5 IMPLANT
CANISTER SUCT 3000ML PPV (MISCELLANEOUS) ×5 IMPLANT
CANNULA MC2 2 STG 29/37 NON-V (CANNULA) ×4 IMPLANT
CANNULA MC2 TWO STAGE (CANNULA) ×5
CANNULA NON VENT 20FR 12 (CANNULA) ×5 IMPLANT
CATH ROBINSON RED A/P 18FR (CATHETERS) ×10 IMPLANT
CLIP RETRACTION 3.0MM CORONARY (MISCELLANEOUS) ×5 IMPLANT
CLIP TI MEDIUM 24 (CLIP) ×1 IMPLANT
CLIP VESOCCLUDE MED 24/CT (CLIP) IMPLANT
CLIP VESOCCLUDE SM WIDE 24/CT (CLIP) IMPLANT
CONN ST 1/2X1/2  BEN (MISCELLANEOUS) ×5
CONN ST 1/2X1/2 BEN (MISCELLANEOUS) ×4 IMPLANT
CONNECTOR BLAKE 2:1 CARIO BLK (MISCELLANEOUS) ×5 IMPLANT
CONTAINER PROTECT SURGISLUSH (MISCELLANEOUS) ×10 IMPLANT
DERMABOND ADVANCED (GAUZE/BANDAGES/DRESSINGS) ×1
DERMABOND ADVANCED .7 DNX12 (GAUZE/BANDAGES/DRESSINGS) IMPLANT
DEVICE EXCLUSIN ATRCLP VLAA 45 (Miscellaneous) IMPLANT
DRAIN CHANNEL 19F RND (DRAIN) ×15 IMPLANT
DRAIN CONNECTOR BLAKE 1:1 (MISCELLANEOUS) ×5 IMPLANT
DRAPE CARDIOVASCULAR INCISE (DRAPES) ×5
DRAPE INCISE IOBAN 66X45 STRL (DRAPES) IMPLANT
DRAPE SRG 135X102X78XABS (DRAPES) ×4 IMPLANT
DRAPE WARM FLUID 44X44 (DRAPES) ×5 IMPLANT
DRESSING AQUACEL AG SP 3.5X10 (GAUZE/BANDAGES/DRESSINGS) IMPLANT
DRSG AQUACEL AG ADV 3.5X10 (GAUZE/BANDAGES/DRESSINGS) ×5 IMPLANT
DRSG AQUACEL AG SP 3.5X10 (GAUZE/BANDAGES/DRESSINGS) ×5
DRSG COVADERM 4X14 (GAUZE/BANDAGES/DRESSINGS) ×4 IMPLANT
ELECT BLADE 4.0 EZ CLEAN MEGAD (MISCELLANEOUS) ×5
ELECT REM PT RETURN 9FT ADLT (ELECTROSURGICAL) ×10
ELECTRODE BLDE 4.0 EZ CLN MEGD (MISCELLANEOUS) ×4 IMPLANT
ELECTRODE REM PT RTRN 9FT ADLT (ELECTROSURGICAL) ×8 IMPLANT
FELT TEFLON 1X6 (MISCELLANEOUS) ×10 IMPLANT
GAUZE 4X4 16PLY ~~LOC~~+RFID DBL (SPONGE) ×5 IMPLANT
GAUZE SPONGE 4X4 12PLY STRL (GAUZE/BANDAGES/DRESSINGS) ×10 IMPLANT
GAUZE SPONGE 4X4 12PLY STRL LF (GAUZE/BANDAGES/DRESSINGS) ×2 IMPLANT
GLOVE BIO SURGEON STRL SZ 6 (GLOVE) ×1 IMPLANT
GLOVE BIO SURGEON STRL SZ 6.5 (GLOVE) ×3 IMPLANT
GLOVE BIO SURGEON STRL SZ7 (GLOVE) ×10 IMPLANT
GLOVE BIOGEL M STRL SZ7.5 (GLOVE) ×10 IMPLANT
GLOVE BIOGEL PI IND STRL 6 (GLOVE) IMPLANT
GLOVE BIOGEL PI INDICATOR 6 (GLOVE) ×2
GOWN STRL REUS W/ TWL LRG LVL3 (GOWN DISPOSABLE) ×16 IMPLANT
GOWN STRL REUS W/ TWL XL LVL3 (GOWN DISPOSABLE) ×8 IMPLANT
GOWN STRL REUS W/TWL LRG LVL3 (GOWN DISPOSABLE) ×20
GOWN STRL REUS W/TWL XL LVL3 (GOWN DISPOSABLE) ×10
HEMOSTAT POWDER SURGIFOAM 1G (HEMOSTASIS) ×14 IMPLANT
INSERT SUTURE HOLDER (MISCELLANEOUS) ×5 IMPLANT
KIT BASIN OR (CUSTOM PROCEDURE TRAY) ×5 IMPLANT
KIT SUCTION CATH 14FR (SUCTIONS) ×5 IMPLANT
KIT TURNOVER KIT B (KITS) ×5 IMPLANT
KIT VASOVIEW HEMOPRO 2 VH 4000 (KITS) ×5 IMPLANT
LEAD PACING MYOCARDI (MISCELLANEOUS) ×5 IMPLANT
MARKER GRAFT CORONARY BYPASS (MISCELLANEOUS) ×15 IMPLANT
NS IRRIG 1000ML POUR BTL (IV SOLUTION) ×25 IMPLANT
PACK ACCESSORY CANNULA KIT (KITS) ×5 IMPLANT
PACK E OPEN HEART (SUTURE) ×5 IMPLANT
PACK OPEN HEART (CUSTOM PROCEDURE TRAY) ×5 IMPLANT
PAD ARMBOARD 7.5X6 YLW CONV (MISCELLANEOUS) ×10 IMPLANT
PAD ELECT DEFIB RADIOL ZOLL (MISCELLANEOUS) ×5 IMPLANT
PENCIL BUTTON HOLSTER BLD 10FT (ELECTRODE) ×5 IMPLANT
POSITIONER HEAD DONUT 9IN (MISCELLANEOUS) ×5 IMPLANT
PUNCH AORTIC ROTATE 4.0MM (MISCELLANEOUS) ×5 IMPLANT
SET MPS 3-ND DEL (MISCELLANEOUS) ×1 IMPLANT
SPONGE T-LAP 18X18 ~~LOC~~+RFID (SPONGE) ×21 IMPLANT
SUPPORT HEART JANKE-BARRON (MISCELLANEOUS) ×5 IMPLANT
SUT BONE WAX W31G (SUTURE) ×5 IMPLANT
SUT ETHIBOND X763 2 0 SH 1 (SUTURE) ×10 IMPLANT
SUT MNCRL AB 3-0 PS2 18 (SUTURE) ×10 IMPLANT
SUT MNCRL AB 4-0 PS2 18 (SUTURE) ×2 IMPLANT
SUT PDS AB 1 CTX 36 (SUTURE) ×10 IMPLANT
SUT PROLENE 4 0 SH DA (SUTURE) ×5 IMPLANT
SUT PROLENE 5 0 C 1 36 (SUTURE) ×16 IMPLANT
SUT PROLENE 7 0 BV1 MDA (SUTURE) ×6 IMPLANT
SUT STEEL 6MS V (SUTURE) ×10 IMPLANT
SUT VIC AB 2-0 CT1 27 (SUTURE) ×10
SUT VIC AB 2-0 CT1 TAPERPNT 27 (SUTURE) IMPLANT
SYSTEM SAHARA CHEST DRAIN ATS (WOUND CARE) ×5 IMPLANT
TAPE PAPER 2X10 WHT MICROPORE (GAUZE/BANDAGES/DRESSINGS) ×1 IMPLANT
TOWEL GREEN STERILE (TOWEL DISPOSABLE) ×5 IMPLANT
TOWEL GREEN STERILE FF (TOWEL DISPOSABLE) ×5 IMPLANT
TRAY FOLEY SLVR 16FR TEMP STAT (SET/KITS/TRAYS/PACK) ×5 IMPLANT
TUBING LAP HI FLOW INSUFFLATIO (TUBING) ×5 IMPLANT
UNDERPAD 30X36 HEAVY ABSORB (UNDERPADS AND DIAPERS) ×5 IMPLANT
WATER STERILE IRR 1000ML POUR (IV SOLUTION) ×10 IMPLANT

## 2021-05-08 NOTE — Brief Op Note (Signed)
05/06/2021 - 05/08/2021 ? ?12:22 PM ? ?PATIENT:  Nathaniel Bender  71 y.o. male ? ?PRE-OPERATIVE DIAGNOSIS:  1. S/p NSTEMI ?2. Coronary artery disease 3. Atrial fibrillation ? ?POST-OPERATIVE DIAGNOSIS: 1. S/p NSTEMI ?2. Coronary artery disease 3. Atrial fibrillation ? ?PROCEDURE:TRANSESOPHAGEAL ECHOCARDIOGRAM (TEE), CORONARY ARTERY BYPASS GRAFTING x4 (LIMA to LAD, SVG to RAMUS INTERMEDIATE, SVG to OM3, SVG to PDA) USING LEFT INTERNAL MAMMORY ARTERY AND BILATERAL THIGH GREATER SAPHENOUS VEIN via ?ENDOVEIN HARVEST OF GREATER SAPHENOUS VEIN (Bilateral), and CLIPPING OF LEFT ATRIAL APPENDAGE ?Vein harvest time: 54 min Vein prep time: 21 min ? ?SURGEON:  Surgeon(s) and Role: ?   Lightfoot, Lucile Crater, MD - Primary ? ?PHYSICIAN ASSISTANT: Lars Pinks PA-C ? ?ASSISTANTS: Dineen Kid RNFA  ? ?ANESTHESIA:   general ? ?EBL:  Per anesthesia, perfusion record ? ?DRAINS:  Chest tubes placed in the mediastinal and pleural spaces   ? ?COUNTS CORRECT:  YES ? ?DICTATION: .Dragon Dictation ? ?PLAN OF CARE: Admit to inpatient  ? ?PATIENT DISPOSITION:  ICU - intubated and hemodynamically stable. ?  ?Delay start of Pharmacological VTE agent (>24hrs) due to surgical blood loss or risk of bleeding: no ? ?BASELINE WEIGHT: 74.3 kg ? ?

## 2021-05-08 NOTE — Anesthesia Procedure Notes (Signed)
Central Venous Catheter Insertion ?Performed by: Belinda Block, MD, anesthesiologist ?Start/End4/18/2023 7:45 AM, 05/08/2021 8:00 AM ?Patient location: Pre-op. ?Preanesthetic checklist: patient identified, IV checked, site marked, risks and benefits discussed, surgical consent, monitors and equipment checked, pre-op evaluation, timeout performed and anesthesia consent ?Hand hygiene performed  and maximum sterile barriers used  ?PA cath was placed.Swan type:thermodilution ?Procedure performed without using ultrasound guided technique. ?Attempts: 1 ?Patient tolerated the procedure well with no immediate complications. ? ? ? ?

## 2021-05-08 NOTE — Op Note (Signed)
? ?   ?[address] ?      [place] [address] ?            [phone]      ?                                 ?  ? ?05/08/2021 ?Patient:  Nathaniel Bender ?Pre-Op Dx: Unstable angina ?  Left main/three-vessel coronary artery disease ?  Hypertension ?  Hyperlipidemia ?  Paroxysmal atrial fibrillation ?Post-op Dx: Same ?Procedure: ?CABG X 4.  LIMA to LAD, reverse saphenous vein graft to ramus intermedius, OM 3, PDA ?Endoscopic greater saphenous vein harvest on the right and left ?45 mm atrial clip placement ? ?Surgeon and Role:   ?   * Lajuana Matte, MD - Primary ?   *D. Ezekiel Slocumb- assisting ?An experienced assistant was required given the complexity of this surgery and the standard of surgical care. The assistant was needed for exposure, dissection, suctioning, retraction of delicate tissues and sutures, instrument exchange and for overall help during this procedure.   ? ?Anesthesia  general ?EBL: 500 ml ?Blood Administration: None ?Xclamp Time: 49 min ?Pump Time: 106 min ? ?Drains: 65 F blake drain: L, mediastinal  ?Wires: Atrial and ventricular ?Counts: correct ? ? ?Indications: ?This is a 71 year old male this admitted with worsening anginal symptoms, paroxysmal atrial fibrillation with RVR, hypertension and hyperlipidemia.  He underwent a left heart cath today which shows severe left main, and RCA disease.  His echocardiogram shows preserved biventricular function and no significant valvular disease.  We discussed the risks and benefits of surgical revascularization along with atrial clip placement.  He is currently in sinus rhythm but is bradycardic with rates in the 50s.  He is agreeable to proceed. ? ?Findings: ?Good LIMA, good vein conduit.  Good distal targets on all walls. ? ?Operative Technique: ?All invasive lines were placed in pre-op holding.  After the risks, benefits and alternatives were thoroughly discussed, the patient was brought to the operative theatre.  Anesthesia  was induced, and the patient was prepped and draped in normal sterile fashion.  An appropriate surgical pause was performed, and pre-operative antibiotics were dosed accordingly. ? ?We began with simultaneous incisions along the right leg for harvesting of the greater saphenous vein and the chest for the sternotomy.  In regards to the sternotomy, this was carried down with bovie cautery, and the sternum was divided with a reciprocating saw.  Meticulous hemostasis was obtained.  The left internal thoracic artery was exposed and harvested in in pedicled fashion.  The patient was systemically heparinized, and the artery was divided distally, and placed in a papaverine sponge.   ? ?The sternal elevator was removed, and a retractor was placed.  The pericardium was divided in the midline and fashioned into a cradle with pericardial stitches.   After we confirmed an appropriate ACT, the ascending aorta was cannulated in standard fashion.  The right atrial appendage was used for venous cannulation site.  Cardiopulmonary bypass was initiated, and the heart retractor was placed. The cross clamp was applied, and a dose of anterograde cardioplegia was given with good arrest of the heart.  We moved to the posterior wall of the heart, and found a good target on the PDA.  An arteriotomy was made, and the vein graft was anastomosed to it in an end to side fashion.  Next we exposed the  lateral wall, and found a good target on the obtuse marginal.  An end to side anastomosis with the vein graft was then created.  Next, we exposed the anterior wall of the heart and identified a good target on ramus intermedius.   An arteriotomy was created.  The vein was anastomosed in an end to side fashion.  We then sized the left atrial appendage at the base of 45 mm clip.  1 was placed along the base.  Finally, we exposed a good target on the LAD, and fashioned an end to side anastomosis between it and the LITA.  We began to re-warm, and a  re-animation dose of cardioplegia was given.  The heart was de-aired, and the cross clamp was removed.  Meticulous hemostasis was obtained.   ? ?A partial occludding clamp was then placed on the ascending aorta, and we created an end to side anastomosis between it and the proximal vein grafts.  Rings were placed on the proximal anastomosis.  Hemostasis was obtained, and we separated from cardiopulmonary bypass without event.  The heparin was reversed with protamine.  Chest tubes and wires were placed, and the sternum was re-approximated with sternal wires.  The soft tissue and skin were re-approximated wth absorbable suture.   ? ?The patient tolerated the procedure without any immediate complications, and was transferred to the ICU in guarded condition. ? ?Lajuana Matte ? ?

## 2021-05-08 NOTE — Discharge Summary (Signed)
Physician Discharge Summary  ? ? ?   ?[address] ?      [place] [address] ?            [phone]   ? ?Patient ID: ?Nathaniel Bender ?MRN: [id number] ?DOB/AGE: 03/18/1950 71 y.o. ? ?Admit date: 05/06/2021 ?Discharge date: 05/13/2021 ? ?Admission Diagnoses: ?  NSTEMI (non-ST elevated myocardial infarction) ([place]) ?2. Coronary artery disease ?3. Atrial fibrillation ? ?Discharge Diagnoses:  ? S/P CABG x 4 ?2. Expected post op blood loss anemia ?3. History of Essential hypertension, benign ?4. History of the following: ?  ?Arthritis     ?  Degenerative Disc Disease Cervical; s/p injection [facility].  ? Atypical mole 05/04/2013  ?  Right Wrist-Severe  ? Cancer ([place]) 07/21/2013  ?  Skin cancer unknown type  ? Colon polyps    ? GERD (gastroesophageal reflux disease)    ? Gout    ? Hyperlipidemia   ? ?Meniere's disease of left ear     ?  s/p ENT evaluation; acute hearing loss; chronic tinnitus L now.  Recovered hearing completely.  ? SCCA (squamous cell carcinoma) of skin 08/08/2014  ?  Left Forehead-Well Diff (curet and 5FU)  ? ? ?Consults: None ? ?Procedure (s):  ?CABG X 4.  LIMA to LAD, reverse saphenous vein graft to ramus intermedius, OM 3, PDA ?Endoscopic greater saphenous vein harvest on the right and left ?45 mm atrial clip placement by Dr. Kipp Bender on 05/08/2021. ? ?HPI: ?This is a 71 yo male iwith history of HTN, HLD, Meniere's Disease, and GERD who presented to the [facility] Emergency Department with complaints of chest pain and shortness of breath.  This developed while the patient was moving boxes.  These episodes had happened previously but on day of presentation he suffered a near syncopal episode.  The episode lasted 15 minutes before it resolved.  Workup in the ED showed the patient to be in Atrial fibrillation with RVR.  His initial troponin level was 31.  Cardiology consulted was obtained and they were in agreement the patient would benefit from admission.  Subsequent troponin levels  were elevated.  He was ruled in for NSTEMI.  Cardiac catheterization was performed today and showed multivessel CAD with LM disease.  It was felt coronary bypass grafting would be indicated and TCTS consult was requested.  The patient is currently chest pain free.  He denies history of smoking.  He is accompanied by his wife and all of their questions were addressed.   ?Dr. Kipp Bender discussed the need for coronary artery bypass grafting surgery. Potential risks, benefits, and complications of the surgery were discussed with the patient and he agreed to proceed with surgery. Pre operative carotid duplex US showed no significant internal carotid artery stenosis bilaterally. ?  ?Hospital Course: ?Patient underwent a CABG x 4. He was transported from the OR to [facility] ICU in stable condition. He was extubated early the evening of surgery. He was V paced. Nathaniel Bender, a line, and foley were removed. Chest tubes remained until output decreased;they were removed on 04/20. He had expected post op blood loss anemia but did not require a transfusion. He had thrombocytopenia. This did resolve as last platelet count was up to 184K. He was transitioned off the Insulin drip. His pre op HGA1C was 6.1. He likely has pre diabetes. Accu checks and sliding scale will be stopped later in hospital course. He was volume overloaded and diuresed accordingly.  Renal function has remained within normal  limits.  EPW were removed on 04/20. He was felt surgically stable for transfer from the ICU to 4E for further convalescence on 04/20 but a bed was not available until 04/21. Marland Kitchen He was ambulating on room air with good oxygenation. He has been tolerating a diet and has had a bowel movement. His wounds are clean, dry, and healing without signs of infection. Lopressor was increased for PVCs on 04/20.  He also had increasing blood pressures and was gradually resumed on his home medications which included Cozaar and Norvasc.  Patient was on 2 iters of  oxygen via [place].  He was weaned to room air prior to discharge.  He has been tolerating a diet and has had a bowel movement. All wounds are clean, dry, healing without signs of infection.  ? ? ?Latest Vital Signs: ?Blood pressure (!) 139/98, pulse 92, temperature 98.9 ?F (37.2 ?C), temperature source Oral, resp. rate 20, height '5\' 7"'$  (1.702 m), weight 71.4 kg, SpO2 94 %. ? ?Physical Exam: ?General appearance: alert, cooperative, and no distress ?Heart: regular rate and rhythm and occas extrasystole ?Lungs: min dim in bases ?Abdomen: benign ?Extremities: no edema ?Wound: incis healing well ?Discharge Condition:Stable and discharged to home. ? ?Recent laboratory studies:  ?Lab Results  ?Component Value Date  ? WBC 8.4 05/13/2021  ? HGB 10.4 (L) 05/13/2021  ? HCT 31.4 (L) 05/13/2021  ? MCV 90.2 05/13/2021  ? PLT 184 05/13/2021  ? ?Lab Results  ?Component Value Date  ? NA 138 05/13/2021  ? K 3.8 05/13/2021  ? CL 107 05/13/2021  ? CO2 25 05/13/2021  ? CREATININE 1.04 05/13/2021  ? GLUCOSE 124 (H) 05/13/2021  ? ? ?  ?Diagnostic Studies: DG Chest 2 View ? ?Result Date: 05/06/2021 ?CLINICAL DATA:  Chest pain and shortness of breath. EXAM: CHEST - 2 VIEW COMPARISON:  04/05/2021 FINDINGS: Normal heart size. No pleural effusion or edema. No airspace densities. Visualized osseous structures appear intact. IMPRESSION: No active cardiopulmonary abnormalities. Electronically Signed   By: Nathaniel Bender M.D.   On: 05/06/2021 13:57  ? ?CARDIAC CATHETERIZATION ? ?Result Date: 05/07/2021 ?Conclusion: Severe LMCA and codominant RCA disease.  LAD, ramus, and LCx demonstrate mild-moderate, non-obstructive disease. Normal left ventricular filling pressure. Recommendations: Cardiac surgery consultation for CABG. Restart IV heparin 2 hours after TR band removal. Aggressive secondary prevention of coronary artery disease. Nathaniel Bush, MD [facility] ? ?DG CHEST PORT 1 VIEW ? ?Result Date: 05/10/2021 ?CLINICAL DATA:  Chest pain, shortness  of breath recent coronary bypass surgery EXAM: PORTABLE CHEST 1 VIEW COMPARISON:  Previous studies including the examination of 05/09/2021. FINDINGS: Transverse diameter of heart is increased. Increased density in the medial left lower lung fields as not changed. Minimal subsegmental atelectasis is seen in the right lower lung fields. There are no signs of alveolar pulmonary edema. Costophrenic angles are clear. There is no pneumothorax. Tip of right IJ central venous catheter is seen in the superior vena cava. No change is noted in the left chest tube and mediastinal drain. There is evidence of coronary bypass surgery. There is a metallic clamp in the region of left atrial appendage. IMPRESSION: Increased density in medial left lower lung fields may suggest atelectasis/pneumonia. Minimal subsegmental atelectasis is seen in the right lower lung fields. There are no signs of pulmonary edema or new focal infiltrates. Electronically Signed   By: Elmer Picker M.D.   On: 05/10/2021 08:11  ? ?DG Chest Port 1 View ? ?Result Date: 05/09/2021 ?CLINICAL DATA:  Four-vessel  CABG follow-up. EXAM: PORTABLE CHEST 1 VIEW COMPARISON:  Portable chest yesterday at 2:12 p.m. FINDINGS: 5:29 a.m., 05/09/2021. Interval extubation. Left chest tube remains in place and at least 1 mediastinal drain. Right IJ catheter through an introducer sheath again terminates in the mid SVC. There is no visible pneumothorax. Left atrial appendage clip is again noted with CABG change, stable mediastinal configuration with aortic atherosclerosis and superior mediastinal widening. There is continued asymmetric left lower lobe opacity which could be atelectasis or consolidation. The remaining hypoexpanded lungs clear with no significant pleural collections. IMPRESSION: 1. Interval extubation. There previously was tubing coiled in the hypopharynx which is also gone. 2. Left chest tube, mediastinal drainage tube and right IJ line are unaltered. 3. Overall  aeration is unchanged. Continued atelectasis or consolidation left base. Low inspiration. Electronically Signed   By: Telford Nab M.D.   On: 05/09/2021 07:21  ? ?DG Chest Port 1 View ? ?Result Date: 4/

## 2021-05-08 NOTE — Anesthesia Preprocedure Evaluation (Signed)
Anesthesia Evaluation  ?Patient identified by MRN, date of birth, ID band ?Patient awake ? ? ? ?Reviewed: ?Allergy & Precautions, NPO status , Patient's Chart, lab work & pertinent test results ? ?Airway ?Mallampati: II ? ?TM Distance: >3 FB ? ? ? ? Dental ?  ?Pulmonary ? ?  ?breath sounds clear to auscultation ? ? ? ? ? ? Cardiovascular ?hypertension, + Past MI and + DOE  ? ?Rhythm:Regular Rate:Normal ? ? ?  ?Neuro/Psych ?  ? GI/Hepatic ?Neg liver ROS, GERD  ,  ?Endo/Other  ?negative endocrine ROS ? Renal/GU ?negative Renal ROS  ? ?  ?Musculoskeletal ? ? Abdominal ?  ?Peds ? Hematology ?  ?Anesthesia Other Findings ? ? Reproductive/Obstetrics ? ?  ? ? ? ? ? ? ? ? ? ? ? ? ? ?  ?  ? ? ? ? ? ? ? ? ?Anesthesia Physical ?Anesthesia Plan ? ?ASA: 3 ? ?Anesthesia Plan: General  ? ?Post-op Pain Management:   ? ?Induction: Intravenous ? ?PONV Risk Score and Plan: Treatment may vary due to age or medical condition, Ondansetron, Dexamethasone and Midazolam ? ?Airway Management Planned: Oral ETT ? ?Additional Equipment: Arterial line, PA Cath, TEE and Ultrasound Guidance Line Placement ? ?Intra-op Plan:  ? ?Post-operative Plan: Post-operative intubation/ventilation ? ?Informed Consent: I have reviewed the patients History and Physical, chart, labs and discussed the procedure including the risks, benefits and alternatives for the proposed anesthesia with the patient or authorized representative who has indicated his/her understanding and acceptance.  ? ? ? ?Dental advisory given ? ?Plan Discussed with: CRNA and Anesthesiologist ? ?Anesthesia Plan Comments:   ? ? ? ? ? ? ?Anesthesia Quick Evaluation ? ?

## 2021-05-08 NOTE — Transfer of Care (Signed)
Immediate Anesthesia Transfer of Care Note ? ?Patient: Nathaniel Bender ? ?Procedure(s) Performed: CORONARY ARTERY BYPASS GRAFTING x4 USING LEFT INTERNAL MAMMORY ARTERY AND BILATERAL GREATER SAPHENOUS VEIN (Chest) ?TRANSESOPHAGEAL ECHOCARDIOGRAM (TEE) ?ENDOVEIN HARVEST OF GREATER SAPHENOUS VEIN (Bilateral) ?CLIPPING OF ATRIAL APPENDAGE (Left) ? ?Patient Location: ICU ? ?Anesthesia Type:General ? ?Level of Consciousness: Patient remains intubated per anesthesia plan ? ?Airway & Oxygen Therapy: Patient remains intubated per anesthesia plan and Patient placed on Ventilator (see vital sign flow sheet for setting) ? ?Post-op Assessment: Report given to RN and Post -op Vital signs reviewed and stable ? ?Post vital signs: Reviewed and stable ? ?Last Vitals:  ?Vitals Value Taken Time  ?BP    ?Temp    ?Pulse    ?Resp 15 05/08/21 1403  ?SpO2 97 % 05/08/21 1403  ?Vitals shown include unvalidated device data. ? ?Last Pain:  ?Vitals:  ? 05/08/21 0353  ?TempSrc: Oral  ?PainSc:   ?   ? ?  ? ?Complications: No notable events documented. ?

## 2021-05-08 NOTE — Consult Note (Addendum)
NAME:  Nathaniel Bender, MRN:  [id number], DOB:  11-08-1950, LOS: 2 ADMISSION DATE:  05/06/2021, CONSULTATION DATE:  05/08/21 REFERRING MD:  Cliffton Asters, CHIEF COMPLAINT:  chest pain   History of Present Illness:  71 year old man with hx of HTN, HLD, GERD, arthritis presenting with chest pressure.   Found to be in Afib with RVR in ER and elevated troponins.  Taken for cath which found severe left main and RCA disease.  Today, underwent CABG x 4 (LIMA-LAD, saphenous-ramus intermedius, sephenous OM3, saphenous PDA), atrial clipping.  He arrives to CVICU V-paced on vent with stable vitals.   Pertinent  Medical History  GERD HTN HLD Alcohol use (20 drinks/week), nonsmoker  Significant Hospital Events: Including procedures, antibiotic start and stop dates in addition to other pertinent events     Interim History / Subjective:  Consulted.  Objective   Blood pressure 117/76, pulse 80, temperature 97.6 F (36.4 C), temperature source Oral, resp. rate 13, height 5\' 7"  (1.702 m), weight 74.3 kg, SpO2 99 %. CVP:  [6 mmHg] 6 mmHg  Vent Mode: SIMV;PRVC;PSV FiO2 (%):  [50 %] 50 % Set Rate:  [12 bmp] 12 bmp Vt Set:  [520 mL] 520 mL PEEP:  [5 cmH20] 5 cmH20 Pressure Support:  [10 cmH20] 10 cmH20 Plateau Pressure:  [15 cmH20] 15 cmH20   Intake/Output Summary (Last 24 hours) at 05/08/2021 1434 Last data filed at 05/08/2021 1320 Gross per 24 hour  Intake 3327 ml  Output 3504 ml  Net -177 ml   Filed Weights   05/06/21 2024 05/07/21 0529 05/08/21 0429  Weight: 75.8 kg 73.7 kg 74.3 kg    Examination: General: no distress on vent HENT: ETT in place, no secretions Lungs: Clear lungs, passive on vent Cardiovascular: Ext warm, paced rhythm on monitor Abdomen: soft, hypoactive BS Extremities: No upper ext edema, harvest sites wrapped bilaterally Neuro: currently sedated/comatose/paralyzed Skin: midline sternotomy scar dressed without strikethrough, mediastinal drains and pacer wires in  place  Resolved Hospital Problem list   N/A  Assessment & Plan:  Postoperative ventilator management Severe CAD s/p CABG x 4 Afib s/p clipping, currently in heart block pacer-dependent- will need Hx HTN, GERD Hx drinking  - Rapid vent wean pathway - Inotrope, drain, pacer management per TCTS, may need device eval - Continue PTA PPI - AC resumption at some point per Dr. Cliffton Asters - Will follow while in [facility] ICU  Best Practice (right click and "Reselect all SmartList Selections" daily)   Diet/type: NPO DVT prophylaxis: not indicated GI prophylaxis: H2B and PPI Lines: Central line Foley:  Yes, and it is still needed Code Status:  full code Last date of multidisciplinary goals of care discussion [N/A]  Labs   CBC: Recent Labs  Lab 05/06/21 1326 05/06/21 2210 05/08/21 0314 05/08/21 0841 05/08/21 1150 05/08/21 1208 05/08/21 1244 05/08/21 1308 05/08/21 1312  WBC 6.2 6.7 8.4  --   --   --   --   --   --   HGB 16.2 15.5 15.0   < > 10.7* 9.2* 8.8* 8.8* 9.2*  HCT 47.7 45.6 45.1   < > 31.2* 27.0* 26.0* 26.0* 27.0*  MCV 86.3 88.2 88.8  --   --   --   --   --   --   PLT 126* 112* 120*  --  89*  --   --   --   --    < > = values in this interval not displayed.    Basic Metabolic  Panel: Recent Labs  Lab 05/06/21 1326 05/06/21 2210 05/08/21 0314 05/08/21 0841 05/08/21 0847 05/08/21 1041 05/08/21 1105 05/08/21 1137 05/08/21 1208 05/08/21 1244 05/08/21 1308 05/08/21 1312  NA 139 139 136 140   < > 140   < > 134* 136 140 139 139  K 3.5 3.8 3.7 3.9   < > 4.2   < > 4.7 5.3* 4.9 4.4 4.4  CL 102 106 104 104  --  106  --  100  --  101 103  --   CO2 23 24 22   --   --   --   --   --   --   --   --   --   GLUCOSE 114* 126* 112* 115*  --  102*  --  118*  --  114* 127*  --   BUN 21 18 16 17   --  18  --  16  --  14 13  --   CREATININE 1.07 1.03 1.08 0.80  --  0.90  --  0.70  --  0.70 0.80  --   CALCIUM 10.3 9.1 8.6*  --   --   --   --   --   --   --   --   --   MG 1.9  --   --    --   --   --   --   --   --   --   --   --    < > = values in this interval not displayed.   GFR: Estimated Creatinine Clearance: 81.5 mL/min (by C-G formula based on SCr of 0.8 mg/dL). Recent Labs  Lab 05/06/21 1326 05/06/21 2210 05/08/21 0314  WBC 6.2 6.7 8.4    Liver Function Tests: Recent Labs  Lab 05/06/21 1326  AST 21  ALT 21  ALKPHOS 77  BILITOT 0.8  PROT 8.3*  ALBUMIN 5.2*   No results for input(s): LIPASE, AMYLASE in the last 168 hours. No results for input(s): AMMONIA in the last 168 hours.  ABG    Component Value Date/Time   PHART 7.405 05/08/2021 1312   PCO2ART 35.4 05/08/2021 1312   PO2ART 90 05/08/2021 1312   HCO3 22.2 05/08/2021 1312   TCO2 23 05/08/2021 1312   ACIDBASEDEF 2.0 05/08/2021 1312   O2SAT 97 05/08/2021 1312     Coagulation Profile: Recent Labs  Lab 05/06/21 2210  INR 1.0    Cardiac Enzymes: No results for input(s): CKTOTAL, CKMB, CKMBINDEX, TROPONINI in the last 168 hours.  HbA1C: Hgb A1c MFr Bld  Date/Time Value Ref Range Status  05/08/2021 03:14 AM 6.1 (H) 4.8 - 5.6 % Final    Comment:    (NOTE) Pre diabetes:          5.7%-6.4%  Diabetes:              >6.4%  Glycemic control for   <7.0% adults with diabetes   07/30/2019 08:24 AM 5.9 (H) 4.8 - 5.6 % Final    Comment:             Prediabetes: 5.7 - 6.4          Diabetes: >6.4          Glycemic control for adults with diabetes: <7.0     CBG: Recent Labs  Lab 05/08/21 1417  GLUCAP 118*    Review of Systems:   Currently sedated and vented  Past Medical History:  He,  has a  past medical history of Arthritis, Atypical mole (05/04/2013), Cancer ([facility]) (07/21/2013), Colon polyps, GERD (gastroesophageal reflux disease), Gout, Hyperlipidemia, Hypertension, Meniere's disease of left ear, and SCCA (squamous cell carcinoma) of skin (08/08/2014).   Surgical History:   Past Surgical History:  Procedure Laterality Date   LEFT HEART CATH AND CORONARY ANGIOGRAPHY N/A  05/07/2021   Procedure: LEFT HEART CATH AND CORONARY ANGIOGRAPHY;  Surgeon: Yvonne Kendall, MD;  Location: [facility] INVASIVE CV LAB;  Service: Cardiovascular;  Laterality: N/A;   ROTATOR CUFF REPAIR Right    TONSILLECTOMY       Social History:   reports that he has never smoked. He has quit using smokeless tobacco. He reports current alcohol use of about 20.0 standard drinks per week. He reports that he does not use drugs.   Family History:  His family history includes Arthritis (age of onset: 53) in his mother; Diabetes in his father; Heart disease in his maternal grandfather; Heart disease (age of onset: 15) in his father; Hyperlipidemia in his father, mother, and sister; Hypertension in his father, mother, and sister; Mental illness in his paternal grandmother; Stroke in his paternal grandfather.   Allergies No Known Allergies   Home Medications  Prior to Admission medications   Medication Sig Start Date End Date Taking? Authorizing Provider  amLODipine (NORVASC) 5 MG tablet Take 1 tablet (5 mg total) by mouth daily. 04/05/20  Yes Shade Flood, MD  aspirin EC 81 MG tablet Take 81 mg by mouth at bedtime.   Yes [provider]  atorvastatin (LIPITOR) 40 MG tablet Take 1 tablet (40 mg total) by mouth daily. Patient taking differently: Take 40 mg by mouth at bedtime. 08/06/19  Yes Lezlie Lye, Meda Coffee, MD  ibuprofen (ADVIL) 200 MG tablet Take 400 mg by mouth every 6 (six) hours as needed for headache or mild pain.   Yes [provider]  losartan (COZAAR) 50 MG tablet Take 50 mg by mouth daily. 10/11/20  Yes [provider]  montelukast (SINGULAIR) 10 MG tablet Take 10 mg by mouth daily. 02/28/21  Yes [provider]  naproxen sodium (ALEVE) 220 MG tablet Take 220 mg by mouth daily as needed (pain).   Yes [provider]  omeprazole (PRILOSEC) 20 MG capsule Take 1 capsule (20 mg total) by mouth daily. 02/24/20  Yes Sagardia, Eilleen Kempf, MD  tadalafil,  PAH, (ADCIRCA) 20 MG tablet TAKE 1/2 TO 1 TABLET (10-20MG  TOTAL) BY MOUTH EVERY OTHER DAY AS NEEDED FOR ERECTILE DYSFUNCTION Patient taking differently: Take 10-20 mg by mouth every other day as needed (erectile dysfunction). 08/06/19  Yes Lezlie Lye, Meda Coffee, MD  Turmeric 500 MG TABS Take 500 mg by mouth daily.   Yes [provider]  amoxicillin-clavulanate (AUGMENTIN) 875-125 MG tablet Take 1 tablet by mouth 2 (two) times daily. Patient not taking: Reported on 05/07/2021 04/05/21   Oretha Milch, MD  predniSONE (DELTASONE) 10 MG tablet Take 1 tablet (10 mg total) by mouth daily with breakfast. Patient not taking: Reported on 05/07/2021 04/05/21   Oretha Milch, MD     Critical care time: 32 minutes

## 2021-05-08 NOTE — Anesthesia Procedure Notes (Addendum)
Central Venous Catheter Insertion ?Performed by: Belinda Block, MD, anesthesiologist ?Start/End4/18/2023 7:45 AM, 05/08/2021 8:00 AM ?Patient location: Pre-op. ?Preanesthetic checklist: patient identified, IV checked, site marked, risks and benefits discussed, surgical consent, monitors and equipment checked, pre-op evaluation, timeout performed and anesthesia consent ?Lidocaine 1% used for infiltration and patient sedated ?Hand hygiene performed  and maximum sterile barriers used  ?Catheter size: 8.5 Fr ?Sheath introducer ?Procedure performed using ultrasound guided technique. ?Ultrasound Notes:anatomy identified, needle tip was noted to be adjacent to the nerve/plexus identified, no ultrasound evidence of intravascular and/or intraneural injection and image(s) printed for medical record ?Attempts: 1 ?Following insertion, line sutured and dressing applied. ?Post procedure assessment: blood return through all ports, free fluid flow and no air ? ?Patient tolerated the procedure well with no immediate complications. ? ? ? ? ?

## 2021-05-08 NOTE — Anesthesia Procedure Notes (Signed)
Arterial Line Insertion ?Start/End4/18/2023 7:50 AM, 05/08/2021 7:53 AM ?Performed by: Belinda Block, MD, Rechelle Niebla Melanee Left, CRNA, CRNA ? Preanesthetic checklist: patient identified, IV checked, site marked, risks and benefits discussed, surgical consent, monitors and equipment checked, pre-op evaluation, timeout performed and anesthesia consent ?Left, radial was placed ?Catheter size: 20 G ?Hand hygiene performed  and maximum sterile barriers used  ?Allen's test indicative of satisfactory collateral circulation ?Attempts: 1 ?Procedure performed without using ultrasound guided technique. ?Following insertion, dressing applied and Biopatch. ?Patient tolerated the procedure well with no immediate complications. ? ? ?

## 2021-05-08 NOTE — Progress Notes (Signed)
?   ?  [address] ?      [place] [address] ?            [phone]      ? ?No events overnight ? ?Vitals:  ? 05/08/21 0353 05/08/21 0429  ?BP: 133/82   ?Pulse: (!) 45 (!) 42  ?Resp: 11 14  ?Temp: 97.6 ?F (36.4 ?C)   ?SpO2: 95% 93%  ? ?Alert NAD ?Sinus brady ?EWOB ? ?OR today for CABG, atriclip ?Lajuana Matte ? ?

## 2021-05-08 NOTE — Plan of Care (Signed)
?  Problem: Education: ?Goal: Will demonstrate proper wound care and an understanding of methods to prevent future damage ?Outcome: Progressing ?Goal: Knowledge of disease or condition will improve ?Outcome: Progressing ?Goal: Knowledge of the prescribed therapeutic regimen will improve ?Outcome: Progressing ?  ?Problem: Activity: ?Goal: Risk for activity intolerance will decrease ?Outcome: Progressing ?  ?Problem: Cardiac: ?Goal: Will achieve and/or maintain hemodynamic stability ?Outcome: Progressing ?  ?Problem: Clinical Measurements: ?Goal: Postoperative complications will be avoided or minimized ?Outcome: Progressing ?  ?Problem: Respiratory: ?Goal: Respiratory status will improve ?Outcome: Progressing ?  ?Problem: Urinary Elimination: ?Goal: Ability to achieve and maintain adequate renal perfusion and functioning will improve ?Outcome: Progressing ?  ?

## 2021-05-08 NOTE — Procedures (Signed)
Extubation Procedure Note ? ?Patient Details:   ?Name: Nathaniel Bender ?DOB: 1950/05/03 ?MRN: [id number] ?  ?Airway Documentation:  ?Airway 8 mm (Active)  ?Secured at (cm) 20 cm 05/08/21 1705  ?Measured From Lips 05/08/21 1705  ?Secured Location Right 05/08/21 1705  ?Secured By [employer] 05/08/21 1705  ?Tube Holder Repositioned Yes 05/08/21 1645  ?Cuff Pressure (cm H2O) Green OR 18-26 CmH2O 05/08/21 1355  ?Site Condition Dry 05/08/21 1705  ? ?Vent end date: (not recorded) Vent end time: (not recorded)  ? ?Evaluation ? O2 sats: stable throughout and currently acceptable ?Complications: No apparent complications ?Patient did tolerate procedure well. ?Bilateral Breath Sounds: Clear, Diminished ?  ?Yes ? ?Patient was able to perform a VC of 1.3 L and a NIF of -10, Dr Kipp Brood was aware and wanted to proceed with rapid wean extubation. Cuff leak was heard, no stridor was noted, RN at the bedside with RT during extubation. ? ?Tiburcio Bash ?05/08/2021, 5:42 PM ? ?

## 2021-05-08 NOTE — Progress Notes (Signed)
RN escorted patient to  OR holding room 37 ? ?

## 2021-05-08 NOTE — Discharge Instructions (Addendum)
Discharge Instructions: ? ?1. You may shower, please wash incisions daily with soap and water and keep dry.  If you wish to cover wounds with dressing you may do so but please keep clean and change daily.  No tub baths or swimming until incisions have completely healed.  If your incisions become red or develop any drainage please call our office at [phone] ? ?2. No Driving until cleared by Dr.Lightfoot's office and you are no longer using narcotic pain medications ? ?3. Monitor your weight daily.. Please use the same scale and weigh at same time... If you gain 5-10 lbs in 48 hours with associated lower extremity swelling, please contact our office at [phone] ? ?4. Fever of 101.5 for at least 24 hours with no source, please contact our office at [phone] ? ?5. Activity- up as tolerated, please walk at least 3 times per day.  Avoid strenuous activity, no lifting, pushing, or pulling with your arms over 8-10 lbs for a minimum of 6 weeks ? ?6. If any questions or concerns arise, please do not hesitate to contact our office at [phone]  ? ?Prediabetes Eating Plan ?Prediabetes is a condition that causes blood sugar (glucose) levels to be higher than normal. This increases the risk for developing type 2 diabetes (type 2 diabetes mellitus). Working with a health care provider or nutrition specialist (dietitian) to make diet and lifestyle changes can help prevent the onset of diabetes. These changes may help you: ?Control your blood glucose levels. ?Improve your cholesterol levels. ?Manage your blood pressure. ?What are tips for following this plan? ?Reading food labels ?Read food labels to check the amount of fat, salt (sodium), and sugar in prepackaged foods. Avoid foods that have: ?Saturated fats. ?Trans fats. ?Added sugars. ?Avoid foods that have more than 300 milligrams (mg) of sodium per serving. Limit your sodium intake to less than 2,300 mg each day. ?Shopping ?Avoid buying pre-made and processed  foods. ?Avoid buying drinks with added sugar. ?Cooking ?Cook with olive oil. Do not use butter, lard, or ghee. ?Bake, broil, grill, steam, or boil foods. Avoid frying. ?Meal planning ?Work with your dietitian to create an eating plan that is right for you. This may include tracking how many calories you take in each day. Use a food diary, notebook, or mobile application to track what you eat at each meal. ?Consider following a Mediterranean diet. This includes: ?Eating several servings of fresh fruits and vegetables each day. ?Eating fish at least twice a week. ?Eating one serving each day of whole grains, beans, nuts, and seeds. ?Using olive oil instead of other fats. ?Limiting alcohol. ?Limiting red meat. ?Using nonfat or low-fat dairy products. ?Consider following a plant-based diet. This includes dietary choices that focus on eating mostly vegetables and fruit, grains, beans, nuts, and seeds. ?If you have high blood pressure, you may need to limit your sodium intake or follow a diet such as the DASH (Dietary Approaches to Stop Hypertension) eating plan. The DASH diet aims to lower high blood pressure. ?Lifestyle ?Set weight loss goals with help from your health care team. It is recommended that most people with prediabetes lose 7% of their body weight. ?Exercise for at least 30 minutes 5 or more days a week. ?Attend a support group or seek support from a mental health counselor. ?Take over-the-counter and prescription medicines only as told by your health care provider. ?What foods are recommended? ?Fruits ?Berries. Bananas. Apples. Oranges. Grapes. Papaya. Mango. Pomegranate. Kiwi. Grapefruit. Cherries. ?Vegetables ?Lettuce. Spinach. Peas.  Beets. Cauliflower. Cabbage. Broccoli. Carrots. Tomatoes. Squash. Eggplant. Herbs. Peppers. Onions. Cucumbers. Brussels sprouts. ?Grains ?Whole grains, such as whole-wheat or whole-grain breads, crackers, cereals, and pasta. Unsweetened oatmeal. Bulgur. Barley. Quinoa. Brown  rice. Corn or whole-wheat flour tortillas or taco shells. ?Meats and other proteins ?Seafood. Poultry without skin. Lean cuts of pork and beef. Tofu. Eggs. Nuts. Beans. ?Dairy ?Low-fat or fat-free dairy products, such as yogurt, cottage cheese, and cheese. ?Beverages ?Water. Tea. Coffee. Sugar-free or diet soda. Seltzer water. Low-fat or nonfat milk. Milk alternatives, such as soy or almond milk. ?Fats and oils ?Olive oil. Canola oil. Sunflower oil. Grapeseed oil. Avocado. Walnuts. ?Sweets and desserts ?Sugar-free or low-fat pudding. Sugar-free or low-fat ice cream and other frozen treats. ?Seasonings and condiments ?Herbs. Sodium-free spices. Mustard. Relish. Low-salt, low-sugar ketchup. Low-salt, low-sugar barbecue sauce. Low-fat or fat-free mayonnaise. ?The items listed above may not be a complete list of recommended foods and beverages. Contact a dietitian for more information. ?What foods are not recommended? ?Fruits ?Fruits canned with syrup. ?Vegetables ?Canned vegetables. Frozen vegetables with butter or cream sauce. ?Grains ?Refined white flour and flour products, such as bread, pasta, snack foods, and cereals. ?Meats and other proteins ?Fatty cuts of meat. Poultry with skin. Breaded or fried meat. Processed meats. ?Dairy ?Full-fat yogurt, cheese, or milk. ?Beverages ?Sweetened drinks, such as iced tea and soda. ?Fats and oils ?Butter. Lard. Ghee. ?Sweets and desserts ?Baked goods, such as cake, cupcakes, pastries, cookies, and cheesecake. ?Seasonings and condiments ?Spice mixes with added salt. Ketchup. Barbecue sauce. Mayonnaise. ?The items listed above may not be a complete list of foods and beverages that are not recommended. Contact a dietitian for more information. ?Where to find more information ?American Diabetes Association: www.diabetes.org ?Summary ?You may need to make diet and lifestyle changes to help prevent the onset of diabetes. These changes can help you control blood sugar, improve  cholesterol levels, and manage blood pressure. ?Set weight loss goals with help from your health care team. It is recommended that most people with prediabetes lose 7% of their body weight. ?Consider following a Mediterranean diet. This includes eating plenty of fresh fruits and vegetables, whole grains, beans, nuts, seeds, fish, and low-fat dairy, and using olive oil instead of other fats. ?This information is not intended to replace advice given to you by your health care provider. Make sure you discuss any questions you have with your health care provider. ?Document Revised: 04/08/2019 Document Reviewed: 04/08/2019 ?Elsevier Patient Education ? [place]. ?

## 2021-05-08 NOTE — Hospital Course (Addendum)
HPI: ?This is a 71 yo male iwith history of HTN, HLD, Meniere's Disease, and GERD who presented to the [facility] Emergency Department with complaints of chest pain and shortness of breath.  This developed while the patient was moving boxes.  These episodes had happened previously but on day of presentation he suffered a near syncopal episode.  The episode lasted 15 minutes before it resolved.  Workup in the ED showed the patient to be in Atrial fibrillation with RVR.  His initial troponin level was 31.  Cardiology consulted was obtained and they were in agreement the patient would benefit from admission.  Subsequent troponin levels were elevated.  He was ruled in for NSTEMI.  Cardiac catheterization was performed today and showed multivessel CAD with LM disease.  It was felt coronary bypass grafting would be indicated and TCTS consult was requested.  The patient is currently chest pain free.  He denies history of smoking.  He is accompanied by his wife and all of their questions were addressed.   ?Dr. Kipp Brood discussed the need for coronary artery bypass grafting surgery. Potential risks, benefits, and complications of the surgery were discussed with the patient and he agreed to proceed with surgery. Pre operative carotid duplex US showed no significant internal carotid artery stenosis bilaterally. ?  ?Hospital Course: ?Patient underwent a CABG x 4. He was transported from the OR to [facility] ICU in stable condition. He was extubated early the evening of surgery. He was V paced. [name], a line, and foley were removed. Chest tubes remained until output decreased;they were removed on 04/20. He had expected post op blood loss anemia but did not require a transfusion. He had thrombocytopenia. This did resolve as last platelet count was up to 184K. He was transitioned off the Insulin drip. His pre op HGA1C was 6.1. He likely has pre diabetes. Accu checks and sliding scale will be stopped later in hospital course. He was  volume overloaded and diuresed accordingly.  Renal function has remained within normal limits.  EPW were removed on 04/20. He was felt surgically stable for transfer from the ICU to 4E for further convalescence on 04/20 but a bed was not available until 04/21. Marland Kitchen He was ambulating on room air with good oxygenation. He has been tolerating a diet and has had a bowel movement. His wounds are clean, dry, and healing without signs of infection. Lopressor was increased for PVCs on 04/20.  He also had increasing blood pressures and was gradually resumed on his home medications which included Cozaar and Norvasc.  Patient was on 2 iters of oxygen via [place].  He was weaned to room air prior to discharge.  He has been tolerating a diet and has had a bowel movement. All wounds are clean, dry, healing without signs of infection. ?

## 2021-05-08 NOTE — Anesthesia Procedure Notes (Signed)
Procedure Name: Intubation ?Date/Time: 05/08/2021 8:32 AM ?Performed by: Glynda Jaeger, CRNA ?Pre-anesthesia Checklist: Patient identified, Patient being monitored, Timeout performed, Emergency Drugs available and Suction available ?Patient Re-evaluated:Patient Re-evaluated prior to induction ?Oxygen Delivery Method: Circle System Utilized ?Preoxygenation: Pre-oxygenation with 100% oxygen ?Induction Type: IV induction ?Ventilation: Mask ventilation without difficulty ?Laryngoscope Size: Mac and 4 ?Grade View: Grade II ?Tube type: Oral ?Tube size: 8.0 mm ?Number of attempts: 1 ?Airway Equipment and Method: Stylet ?Placement Confirmation: ETT inserted through vocal cords under direct vision, positive ETCO2 and breath sounds checked- equal and bilateral ?Secured at: 22 cm ?Tube secured with: Tape ?Dental Injury: Teeth and Oropharynx as per pre-operative assessment  ? ? ? ? ?

## 2021-05-09 ENCOUNTER — Encounter (HOSPITAL_COMMUNITY): Payer: Self-pay | Admitting: Thoracic Surgery (Cardiothoracic Vascular Surgery)

## 2021-05-09 ENCOUNTER — Inpatient Hospital Stay (HOSPITAL_COMMUNITY): Payer: Medicare Other

## 2021-05-09 DIAGNOSIS — J9601 Acute respiratory failure with hypoxia: Secondary | ICD-10-CM

## 2021-05-09 DIAGNOSIS — R739 Hyperglycemia, unspecified: Secondary | ICD-10-CM

## 2021-05-09 DIAGNOSIS — I214 Non-ST elevation (NSTEMI) myocardial infarction: Secondary | ICD-10-CM | POA: Diagnosis not present

## 2021-05-09 LAB — BASIC METABOLIC PANEL
Anion gap: 7 (ref 5–15)
Anion gap: 8 (ref 5–15)
BUN: 13 mg/dL (ref 8–23)
BUN: 16 mg/dL (ref 8–23)
CO2: 23 mmol/L (ref 22–32)
CO2: 25 mmol/L (ref 22–32)
Calcium: 8 mg/dL — ABNORMAL LOW (ref 8.9–10.3)
Calcium: 8.4 mg/dL — ABNORMAL LOW (ref 8.9–10.3)
Chloride: 104 mmol/L (ref 98–111)
Chloride: 99 mmol/L (ref 98–111)
Creatinine, Ser: 0.94 mg/dL (ref 0.61–1.24)
Creatinine, Ser: 0.98 mg/dL (ref 0.61–1.24)
GFR, Estimated: >60 mL/min (ref >60)
GFR, Estimated: >60 mL/min (ref >60)
Glucose, Bld: 138 mg/dL — ABNORMAL HIGH (ref 70–99)
Glucose, Bld: 126 mg/dL — ABNORMAL HIGH (ref 70–99)
Potassium: 3.9 mmol/L (ref 3.5–5.1)
Potassium: 3.7 mmol/L (ref 3.5–5.1)
Sodium: 134 mmol/L — ABNORMAL LOW (ref 135–145)
Sodium: 132 mmol/L — ABNORMAL LOW (ref 135–145)

## 2021-05-09 LAB — GLUCOSE, CAPILLARY
Glucose-Capillary: 100 mg/dL — ABNORMAL HIGH (ref 70–99)
Glucose-Capillary: 107 mg/dL — ABNORMAL HIGH (ref 70–99)
Glucose-Capillary: 116 mg/dL — ABNORMAL HIGH (ref 70–99)
Glucose-Capillary: 122 mg/dL — ABNORMAL HIGH (ref 70–99)
Glucose-Capillary: 125 mg/dL — ABNORMAL HIGH (ref 70–99)
Glucose-Capillary: 127 mg/dL — ABNORMAL HIGH (ref 70–99)
Glucose-Capillary: 127 mg/dL — ABNORMAL HIGH (ref 70–99)
Glucose-Capillary: 130 mg/dL — ABNORMAL HIGH (ref 70–99)
Glucose-Capillary: 131 mg/dL — ABNORMAL HIGH (ref 70–99)
Glucose-Capillary: 134 mg/dL — ABNORMAL HIGH (ref 70–99)
Glucose-Capillary: 135 mg/dL — ABNORMAL HIGH (ref 70–99)
Glucose-Capillary: 142 mg/dL — ABNORMAL HIGH (ref 70–99)
Glucose-Capillary: 143 mg/dL — ABNORMAL HIGH (ref 70–99)
Glucose-Capillary: 145 mg/dL — ABNORMAL HIGH (ref 70–99)
Glucose-Capillary: 154 mg/dL — ABNORMAL HIGH (ref 70–99)

## 2021-05-09 LAB — CBC
HCT: 34.4 % — ABNORMAL LOW (ref 39.0–52.0)
HCT: 32.7 % — ABNORMAL LOW (ref 39.0–52.0)
Hemoglobin: 12 g/dL — ABNORMAL LOW (ref 13.0–17.0)
Hemoglobin: 11.2 g/dL — ABNORMAL LOW (ref 13.0–17.0)
MCH: 31 pg (ref 26.0–34.0)
MCH: 30.5 pg (ref 26.0–34.0)
MCHC: 34.9 g/dL (ref 30.0–36.0)
MCHC: 34.3 g/dL (ref 30.0–36.0)
MCV: 88.9 fL (ref 80.0–100.0)
MCV: 89.1 fL (ref 80.0–100.0)
Platelets: 107 10*3/uL — ABNORMAL LOW (ref 150–400)
Platelets: 111 10*3/uL — ABNORMAL LOW (ref 150–400)
RBC: 3.87 MIL/uL — ABNORMAL LOW (ref 4.22–5.81)
RBC: 3.67 MIL/uL — ABNORMAL LOW (ref 4.22–5.81)
RDW: 13.2 % (ref 11.5–15.5)
RDW: 13.5 % (ref 11.5–15.5)
WBC: 7.8 10*3/uL (ref 4.0–10.5)
WBC: 8.8 10*3/uL (ref 4.0–10.5)
nRBC: 0 % (ref 0.0–0.2)
nRBC: 0 % (ref 0.0–0.2)

## 2021-05-09 LAB — MAGNESIUM
Magnesium: 2 mg/dL (ref 1.7–2.4)
Magnesium: 2.1 mg/dL (ref 1.7–2.4)

## 2021-05-09 LAB — ECHO INTRAOPERATIVE TEE
Height: 67 in
Weight: 2620.83 oz

## 2021-05-09 MED ORDER — PROCHLORPERAZINE EDISYLATE 10 MG/2ML IJ SOLN
10.0000 mg | Freq: Four times a day (QID) | INTRAMUSCULAR | Status: DC | PRN
  Administered 2021-05-09 – 2021-05-10 (×2): 10 mg via INTRAVENOUS
  Filled 2021-05-09 (×3): qty 2

## 2021-05-09 MED ORDER — METOCLOPRAMIDE HCL 5 MG/ML IJ SOLN
10.0000 mg | Freq: Four times a day (QID) | INTRAMUSCULAR | Status: AC
  Administered 2021-05-09 – 2021-05-10 (×5): 10 mg via INTRAVENOUS
  Filled 2021-05-09 (×6): qty 2

## 2021-05-09 MED ORDER — ENOXAPARIN SODIUM 40 MG/0.4ML IJ SOSY
40.0000 mg | PREFILLED_SYRINGE | Freq: Every day | INTRAMUSCULAR | Status: DC
  Administered 2021-05-09 – 2021-05-11 (×3): 40 mg via SUBCUTANEOUS
  Filled 2021-05-09 (×4): qty 0.4

## 2021-05-09 MED ORDER — INSULIN ASPART 100 UNIT/ML IJ SOLN
0.0000 [IU] | INTRAMUSCULAR | Status: DC
  Administered 2021-05-09 – 2021-05-10 (×5): 2 [IU] via SUBCUTANEOUS
  Administered 2021-05-10: 8 [IU] via SUBCUTANEOUS
  Administered 2021-05-11: 4 [IU] via SUBCUTANEOUS

## 2021-05-09 NOTE — Progress Notes (Signed)
EKG CRITICAL VALUE  ? ? ? ?12 lead EKG performed.  Critical value noted.  Sammuel Cooper, RN notified. ? ? ?Ouida Sills, CCT ?05/09/2021 7:34 AM   ?

## 2021-05-09 NOTE — Anesthesia Postprocedure Evaluation (Signed)
Anesthesia Post Note ? ?Patient: Nathaniel Bender ? ?Procedure(s) Performed: CORONARY ARTERY BYPASS GRAFTING x4 USING LEFT INTERNAL MAMMORY ARTERY AND BILATERAL GREATER SAPHENOUS VEIN (Chest) ?TRANSESOPHAGEAL ECHOCARDIOGRAM (TEE) ?ENDOVEIN HARVEST OF GREATER SAPHENOUS VEIN (Bilateral) ?CLIPPING OF ATRIAL APPENDAGE (Left) ? ?  ? ?Patient location during evaluation: ICU ?Anesthesia Type: General ?Level of consciousness: patient remains intubated per anesthesia plan ?Pain management: pain level controlled ?Vital Signs Assessment: post-procedure vital signs reviewed and stable ?Respiratory status: patient remains intubated per anesthesia plan ?Cardiovascular status: stable ?Postop Assessment: no apparent nausea or vomiting ?Anesthetic complications: no ? ? ?No notable events documented. ? ?Last Vitals:  ?Vitals:  ? 05/09/21 1644 05/09/21 1700  ?BP:  (!) 153/83  ?Pulse:    ?Resp:  (!) 25  ?Temp: 36.5 ?C   ?SpO2:  97%  ?  ?Last Pain:  ?Vitals:  ? 05/09/21 1644  ?TempSrc: Oral  ?PainSc:   ? ? ?  ?  ?  ?  ?  ?  ? ?Nathaniel Bender ? ? ? ? ?

## 2021-05-09 NOTE — Progress Notes (Signed)
Patient ID: Nathaniel Bender, male   DOB: January 28, 1950, 70 y.o.   MRN: [id number] ? ?TCTS Evening Rounds: ? ?Hemodynamically stable  ? ?Sats 99% ? ?Off insulin drip. ? ?UO good ? ?CT output low. ? ?BMET ?   ?Component Value Date/Time  ? NA 132 (L) 05/09/2021 1756  ? NA 142 07/30/2019 0824  ? K 3.7 05/09/2021 1756  ? CL 99 05/09/2021 1756  ? CO2 25 05/09/2021 1756  ? GLUCOSE 126 (H) 05/09/2021 1756  ? BUN 16 05/09/2021 1756  ? BUN 17 07/30/2019 0824  ? CREATININE 0.98 05/09/2021 1756  ? CREATININE 1.03 08/01/2015 1303  ? CALCIUM 8.4 (L) 05/09/2021 1756  ? GFRNONAA >60 05/09/2021 1756  ? ?CBC ?   ?Component Value Date/Time  ? WBC 8.8 05/09/2021 1756  ? RBC 3.67 (L) 05/09/2021 1756  ? HGB 11.2 (L) 05/09/2021 1756  ? HGB 15.1 06/02/2017 0855  ? HCT 32.7 (L) 05/09/2021 1756  ? HCT 45.2 06/02/2017 0855  ? PLT 111 (L) 05/09/2021 1756  ? PLT 156 06/02/2017 0855  ? MCV 89.1 05/09/2021 1756  ? MCV 88.8 07/03/2018 1026  ? MCV 89 06/02/2017 0855  ? MCH 30.5 05/09/2021 1756  ? MCHC 34.3 05/09/2021 1756  ? RDW 13.5 05/09/2021 1756  ? RDW 13.6 06/02/2017 0855  ? LYMPHSABS 2.5 06/02/2017 0855  ? MONOABS 369 08/01/2015 1303  ? EOSABS 0.2 06/02/2017 0855  ? BASOSABS 0.0 06/02/2017 0855  ? ? ?

## 2021-05-09 NOTE — Progress Notes (Addendum)
? ?NAME:  Nathaniel Bender, MRN:  [id number], DOB:  1950/07/26, LOS: 3 ?ADMISSION DATE:  05/06/2021, CONSULTATION DATE:  05/08/21 ?REFERRING MD:  Kipp Brood, CHIEF COMPLAINT:  chest pain  ? ?History of Present Illness:  ?71 year old man with hx of HTN, HLD, GERD, arthritis presenting with chest pressure.   Found to be in Afib with RVR in ER and elevated troponins.  Taken for cath which found severe left main and RCA disease.  Today, underwent CABG x 4 (LIMA-LAD, saphenous-ramus intermedius, sephenous OM3, saphenous PDA), atrial clipping.  He arrives to CVICU V-paced on vent with stable vitals.  ? ?Pertinent  Medical History  ?GERD ?HTN ?HLD ?Alcohol use (20 drinks/week), nonsmoker ? ?Significant Hospital Events: ?Including procedures, antibiotic start and stop dates in addition to other pertinent events   ? ? ?Interim History / Subjective:  ?Extubated, feels a bit better. ? ?Objective   ?Blood pressure (!) 129/92, pulse 80, temperature 99 ?F (37.2 ?C), resp. rate (!) 24, height '5\' 7"'$  (1.702 m), weight 78.7 kg, SpO2 98 %. ?CVP:  [1 mmHg-7 mmHg] 4 mmHg  ?Vent Mode: CPAP;PSV ?FiO2 (%):  [40 %-50 %] 40 % ?Set Rate:  [4 bmp-12 bmp] 4 bmp ?Vt Set:  [520 mL] 520 mL ?PEEP:  [5 cmH20] 5 cmH20 ?Pressure Support:  [10 cmH20] 10 cmH20 ?Plateau Pressure:  [15 cmH20] 15 cmH20  ? ?Intake/Output Summary (Last 24 hours) at 05/09/2021 0928 ?Last data filed at 05/09/2021 0800 ?Gross per 24 hour  ?Intake 4712 ml  ?Output 6209 ml  ?Net -1497 ml  ? ? ?Filed Weights  ? 05/07/21 0529 05/08/21 0429 05/09/21 0500  ?Weight: 73.7 kg 74.3 kg 78.7 kg  ? ? ?Examination: ?General: Adult male, resting in bed, in NAD. ?Neuro: A&O x 3, no deficits. ?HEENT: /AT. Sclerae anicteric. EOMI. ?Cardiovascular: Sternal dressing in place. Chest tube in place. Paced, ? SEM. ?Lungs: Respirations even and unlabored.  CTA bilaterally, No W/R/R. ?Abdomen: BS x 4, soft, NT/ND.  ?Musculoskeletal: No gross deformities, no edema.  ?Skin: Intact, warm, no rashes. ? ?Assessment  & Plan:  ?Postoperative ventilator management - extubated successfully 4/18. ?Acute hypoxic respiratory failure. 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 Will need PPM ?Hyperglycemia ?Hx HTN, GERD ?Hx drinking ? ?- Continue supplemental O2 as needed to maintain SpO2 > 92%. ?- Bronchial hygiene. ?- Inotrope (none currently) chest tubes, pacer management per TCTS, may need device eval. ?- Continue PTA PPI. ?- Insulin gtt, wean and transition to SSI. ?- AC resumption at some point per Dr. Kipp Brood. ? ?Best Practice (right click and "Reselect all SmartList Selections" daily)  ?Per primary team. ? ?Critical care time: 20 minutes  ? ? ?Montey Hora, PA - C ?[facility] ?For pager details, please see AMION or use Epic chat  ?After 1900, please call [facility] for cross coverage needs ?05/09/2021, 9:38 AM ? ? ?PCCM: ? ?71 yo M, post-CABG, doing well still with lines and tubes. Pacer wires in place  ? ?BP 114/71   Pulse 85   Temp 98.8 ?F (37.1 ?C)   Resp (!) 22   Ht '5\' 7"'$  (1.702 m)   Wt 78.7 kg   SpO2 90%   BMI 27.17 kg/m?   ?Gen: male, comfortable in bed, off pressors  ?HENT: NCAT, tracking  ?Heart: RRR, s1 s2 ?Lungs: CTAB ? ?Labs reviewed  ? ?A:  ?Postop vent support, resolved  ?Severe cad w/ NSTEMI s/p CABG  ?  Afib w/ clipping  ? ?P: ?Weaning fio2  ?Needs mobility  ?Pacer wires in place  ?Transition from insulin infusion to ssi  ?Ac resumption per surgery  ? ?Garner Nash, DO ?[facility] ?05/09/2021 1:49 PM   ? ? ?

## 2021-05-09 NOTE — Progress Notes (Addendum)
TCTS DAILY ICU PROGRESS NOTE ? ?                 [address] ?           [place] [address] ?         [phone]  ? ?1 Day Post-Op ?Procedure(s) (LRB): ?CORONARY ARTERY BYPASS GRAFTING x4 USING LEFT INTERNAL MAMMORY ARTERY AND BILATERAL GREATER SAPHENOUS VEIN (N/A) ?TRANSESOPHAGEAL ECHOCARDIOGRAM (TEE) ?ENDOVEIN HARVEST OF GREATER SAPHENOUS VEIN (Bilateral) ?CLIPPING OF ATRIAL APPENDAGE (Left) ? ?Total Length of Stay:  LOS: 3 days  ? ?Subjective: ?Patient awake, alert. He does have some nausea. Mother is at bedside. ? ?Objective: ?Vital signs in last 24 hours: ?Temp:  [96.8 ?F (36 ?C)-99.1 ?F (37.3 ?C)] 99.1 ?F (37.3 ?C) (04/19 0600) ?Pulse Rate:  [54-80] 80 (04/18 1705) ?Cardiac Rhythm: A-V Sequential paced (04/18 2000) ?Resp:  [4-31] 29 (04/19 0600) ?BP: (104-136)/(72-95) 121/74 (04/19 0500) ?SpO2:  [93 %-100 %] 94 % (04/19 0600) ?Arterial Line BP: (96-145)/(40-122) 140/67 (04/19 0600) ?FiO2 (%):  [40 %-50 %] 40 % (04/18 1705) ?Weight:  [78.7 kg] 78.7 kg (04/19 0500) ? ?Filed Weights  ? 05/07/21 0529 05/08/21 0429 05/09/21 0500  ?Weight: 73.7 kg 74.3 kg 78.7 kg  ? ? ?Weight change: 4.4 kg  ? ?Hemodynamic parameters for last 24 hours: ?CVP:  [1 mmHg-7 mmHg] 3 mmHg ? ?Intake/Output from previous day: ?04/18 0701 - 04/19 0700 ?In: 4916.8 [I.V.:2860.3; Blood:327; IV Piggyback:1729.5] ?Out: 6160 [VPXTG:6269; Emesis/NG output:150; Blood:479; Chest Tube:470] ? ?Intake/Output this shift: ?No intake/output data recorded. ? ?Current Meds: ?Scheduled Meds: ? acetaminophen  1,000 mg Oral Q6H  ? Or  ? acetaminophen (TYLENOL) oral liquid 160 mg/5 mL  1,000 mg Per Tube Q6H  ? aspirin EC  325 mg Oral Daily  ? Or  ? aspirin  324 mg Per Tube Daily  ? atorvastatin  80 mg Oral Daily  ? bisacodyl  10 mg Oral Daily  ? Or  ? bisacodyl  10 mg Rectal Daily  ? Chlorhexidine Gluconate Cloth  6 each Topical Daily  ? docusate sodium  200 mg Oral Daily  ? folic acid  1 mg Per Tube Daily  ? mouth rinse  15 mL Mouth Rinse BID   ? metoprolol tartrate  12.5 mg Oral BID  ? Or  ? metoprolol tartrate  12.5 mg Per Tube BID  ? [START ON 05/10/2021] pantoprazole  40 mg Oral Daily  ? sodium chloride flush  10-40 mL Intracatheter Q12H  ? sodium chloride flush  3 mL Intravenous Q12H  ? thiamine  100 mg Per Tube Daily  ? ?Continuous Infusions: ? sodium chloride 10 mL/hr at 05/09/21 0600  ? sodium chloride    ? sodium chloride    ? albumin human Stopped (05/08/21 1703)  ?  ceFAZolin (ANCEF) IV Stopped (05/09/21 0551)  ? dexmedetomidine (PRECEDEX) IV infusion Stopped (05/08/21 1557)  ? insulin 0.7 Units/hr (05/09/21 0600)  ? lactated ringers    ? lactated ringers    ? lactated ringers 20 mL/hr at 05/09/21 0600  ? niCARDipine Stopped (05/09/21 0540)  ? nitroGLYCERIN Stopped (05/08/21 1532)  ? phenylephrine (NEO-SYNEPHRINE) Adult infusion 0 mcg/min (05/08/21 1400)  ? ?PRN Meds:.sodium chloride, albumin human, dextrose, lactated ringers, metoprolol tartrate, midazolam, morphine injection, ondansetron (ZOFRAN) IV, oxyCODONE, sodium chloride flush, sodium chloride flush, traMADol ? ?General appearance: alert, cooperative, and no distress ?Neurologic: intact ?Heart: Paced ?Lungs: Slightly diminished bibasilar breath sounds ?Abdomen: Soft, non tender, bowel sounds present ?Extremities: SCDs  in place ?Wound: Aquacel intact on sternal wound. Bilateral LE wounds are clean and mostly dry ? ?Lab Results: ?CBC: ?Recent Labs  ?  05/08/21 ?1947 05/09/21 ?0316  ?WBC 8.1 7.8  ?HGB 12.1* 12.0*  ?HCT 34.9* 34.4*  ?PLT 93* 107*  ? ?BMET:  ?Recent Labs  ?  05/08/21 ?1947 05/09/21 ?0316  ?NA 138 134*  ?K 4.1 3.9  ?CL 109 104  ?CO2 23 23  ?GLUCOSE 136* 138*  ?BUN 12 13  ?CREATININE 0.94 0.94  ?CALCIUM 8.0* 8.0*  ?  ?CMET: ?Lab Results  ?Component Value Date  ? WBC 7.8 05/09/2021  ? HGB 12.0 (L) 05/09/2021  ? HCT 34.4 (L) 05/09/2021  ? PLT 107 (L) 05/09/2021  ? GLUCOSE 138 (H) 05/09/2021  ? CHOL 178 05/06/2021  ? TRIG 148 05/06/2021  ? HDL 53 05/06/2021  ? [place] 95  05/06/2021  ? ALT 21 05/06/2021  ? AST 21 05/06/2021  ? NA 134 (L) 05/09/2021  ? K 3.9 05/09/2021  ? CL 104 05/09/2021  ? CREATININE 0.94 05/09/2021  ? BUN 13 05/09/2021  ? CO2 23 05/09/2021  ? TSH 2.620 07/30/2019  ? PSA 1.33 08/01/2015  ? INR 1.5 (H) 05/08/2021  ? HGBA1C 6.1 (H) 05/08/2021  ? ? ? ? ?PT/INR:  ?Recent Labs  ?  05/08/21 ?1419  ?LABPROT 17.5*  ?INR 1.5*  ? ?Radiology: [facility] Chest Port 1 View ? ?Result Date: 05/09/2021 ?CLINICAL DATA:  Four-vessel CABG follow-up. EXAM: PORTABLE CHEST 1 VIEW COMPARISON:  Portable chest yesterday at 2:12 p.m. FINDINGS: 5:29 a.m., 05/09/2021. Interval extubation. Left chest tube remains in place and at least 1 mediastinal drain. Right IJ catheter through an introducer sheath again terminates in the mid SVC. There is no visible pneumothorax. Left atrial appendage clip is again noted with CABG change, stable mediastinal configuration with aortic atherosclerosis and superior mediastinal widening. There is continued asymmetric left lower lobe opacity which could be atelectasis or consolidation. The remaining hypoexpanded lungs clear with no significant pleural collections. IMPRESSION: 1. Interval extubation. There previously was tubing coiled in the hypopharynx which is also gone. 2. Left chest tube, mediastinal drainage tube and right IJ line are unaltered. 3. Overall aeration is unchanged. Continued atelectasis or consolidation left base. Low inspiration. Electronically Signed   By: Telford Nab M.D.   On: 05/09/2021 07:21  ? ?DG Chest Port 1 View ? ?Result Date: 05/08/2021 ?CLINICAL DATA:  Status post Cardiothoracic surgery EXAM: PORTABLE CHEST 1 VIEW COMPARISON:  Chest radiograph 2 days prior FINDINGS: Endotracheal tube appears to terminate in the right mainstem bronchus. There is a right internal jugular approach central venous catheter tip terminating in the SVC. Mediastinal/chest tube present, terminating in the left chest. Query if there may be additional tubing coiled  in the hypopharynx. Left atrial ligation clip present. Status post median sternotomy. Widened appearance of the mediastinum. No large pleural effusion. No pneumothorax. Low lung volumes results in bronchovascular crowding. Scattered areas of subsegmental atelectasis more pronounced in the left lung. IMPRESSION: 1. Endotracheal tube appears to terminate in the right mainstem bronchus. Recommend retraction of approximately 4 cm. 2. Query whether there is tubing coiled within the hypopharynx. Correlate for presence of a misplaced enteric tube. 3. Other support lines and tubes as described. 4. Widened appearance of the mediastinum may be expected after Cardiothoracic surgery. Attention on follow-up films. 5. No findings of significant effusion or pneumothorax. These results were called by telephone at the time of interpretation on 05/08/2021 at 2:49 pm to provider  Dr. Kipp Brood, who verbally acknowledged these results. Electronically Signed   By: Albin Felling M.D.   On: 05/08/2021 14:50   ? ? ?Assessment/Plan: ?S/P Procedure(s) (LRB): ?CORONARY ARTERY BYPASS GRAFTING x4 USING LEFT INTERNAL MAMMORY ARTERY AND BILATERAL GREATER SAPHENOUS VEIN (N/A) ?TRANSESOPHAGEAL ECHOCARDIOGRAM (TEE) ?ENDOVEIN HARVEST OF GREATER SAPHENOUS VEIN (Bilateral) ?CLIPPING OF ATRIAL APPENDAGE (Left) ?CV-S/p NSTEMI. CO/CI 5.1/2.7. Paced this am. EKG showed some elevation in V2 and V3 but looking at pre op EKG may be similar? Will discuss with Dr. Kipp Brood. On Lopressor 12.5 mg bid ?Pulmonary-on 4 liters of oxygen via [place]. Wean as able over next few days. Chest tubes with 470 cc since surgery. CXR this am shows atelectasis. Encourage incentive spirometer ?Volume overload-would like to gently diurese;will discuss with Dr. Kipp Brood ?Expected post op blood loss anemia-H and H this am stable at 12 and 34.4 ?CBGs 130/127/131. Pre op HGA1C 6.1. Transition off Insulin drip. Will stop accu checks and SS PRN once transferred ?6. Supplement potassium ?7.  Thrombocytopenia-platelets up to 107,000 this am ?8. Remove a line as not on any drip ?9. GI-nausea. Zofran PRN. Will give a few doses of Reglan ? ?Donielle Liston Alba PA-C ?05/09/2021 7:23 AM  ? ?Agree with

## 2021-05-10 ENCOUNTER — Inpatient Hospital Stay (HOSPITAL_COMMUNITY): Payer: Medicare Other

## 2021-05-10 LAB — GLUCOSE, CAPILLARY
Glucose-Capillary: 106 mg/dL — ABNORMAL HIGH (ref 70–99)
Glucose-Capillary: 121 mg/dL — ABNORMAL HIGH (ref 70–99)
Glucose-Capillary: 125 mg/dL — ABNORMAL HIGH (ref 70–99)
Glucose-Capillary: 211 mg/dL — ABNORMAL HIGH (ref 70–99)
Glucose-Capillary: 93 mg/dL (ref 70–99)
Glucose-Capillary: 94 mg/dL (ref 70–99)

## 2021-05-10 LAB — CBC
HCT: 31.4 % — ABNORMAL LOW (ref 39.0–52.0)
HCT: 32.8 % — ABNORMAL LOW (ref 39.0–52.0)
Hemoglobin: 10.6 g/dL — ABNORMAL LOW (ref 13.0–17.0)
Hemoglobin: 11.2 g/dL — ABNORMAL LOW (ref 13.0–17.0)
MCH: 30.1 pg (ref 26.0–34.0)
MCH: 30.3 pg (ref 26.0–34.0)
MCHC: 33.8 g/dL (ref 30.0–36.0)
MCHC: 34.1 g/dL (ref 30.0–36.0)
MCV: 89.2 fL (ref 80.0–100.0)
MCV: 88.6 fL (ref 80.0–100.0)
Platelets: 106 10*3/uL — ABNORMAL LOW (ref 150–400)
Platelets: 121 10*3/uL — ABNORMAL LOW (ref 150–400)
RBC: 3.52 MIL/uL — ABNORMAL LOW (ref 4.22–5.81)
RBC: 3.7 MIL/uL — ABNORMAL LOW (ref 4.22–5.81)
RDW: 13.5 % (ref 11.5–15.5)
RDW: 13.6 % (ref 11.5–15.5)
WBC: 8.7 10*3/uL (ref 4.0–10.5)
WBC: 10.5 10*3/uL (ref 4.0–10.5)
nRBC: 0 % (ref 0.0–0.2)
nRBC: 0 % (ref 0.0–0.2)

## 2021-05-10 LAB — BASIC METABOLIC PANEL
Anion gap: 7 (ref 5–15)
Anion gap: 6 (ref 5–15)
BUN: 15 mg/dL (ref 8–23)
BUN: 17 mg/dL (ref 8–23)
CO2: 26 mmol/L (ref 22–32)
CO2: 27 mmol/L (ref 22–32)
Calcium: 8.3 mg/dL — ABNORMAL LOW (ref 8.9–10.3)
Calcium: 8.5 mg/dL — ABNORMAL LOW (ref 8.9–10.3)
Chloride: 105 mmol/L (ref 98–111)
Chloride: 104 mmol/L (ref 98–111)
Creatinine, Ser: 0.89 mg/dL (ref 0.61–1.24)
Creatinine, Ser: 0.93 mg/dL (ref 0.61–1.24)
GFR, Estimated: >60 mL/min (ref >60)
GFR, Estimated: >60 mL/min (ref >60)
Glucose, Bld: 105 mg/dL — ABNORMAL HIGH (ref 70–99)
Glucose, Bld: 96 mg/dL (ref 70–99)
Potassium: 3.8 mmol/L (ref 3.5–5.1)
Potassium: 3.7 mmol/L (ref 3.5–5.1)
Sodium: 138 mmol/L (ref 135–145)
Sodium: 137 mmol/L (ref 135–145)

## 2021-05-10 MED ORDER — METOPROLOL TARTRATE 25 MG/10 ML ORAL SUSPENSION
25.0000 mg | Freq: Two times a day (BID) | ORAL | Status: DC
  Filled 2021-05-10: qty 10

## 2021-05-10 MED ORDER — SODIUM CHLORIDE 0.9 % IV SOLN: 250.0000 mL | INTRAVENOUS | Status: DC | PRN

## 2021-05-10 MED ORDER — FUROSEMIDE 10 MG/ML IJ SOLN
40.0000 mg | Freq: Once | INTRAMUSCULAR | Status: AC
Start: 2021-05-10 — End: 2021-05-10
  Administered 2021-05-10: 40 mg via INTRAVENOUS
  Filled 2021-05-10: qty 4

## 2021-05-10 MED ORDER — SODIUM CHLORIDE 0.9% FLUSH
3.0000 mL | Freq: Two times a day (BID) | INTRAVENOUS | Status: DC
  Administered 2021-05-10 – 2021-05-12 (×4): 3 mL via INTRAVENOUS

## 2021-05-10 MED ORDER — METOPROLOL TARTRATE 25 MG PO TABS
25.0000 mg | ORAL_TABLET | Freq: Two times a day (BID) | ORAL | Status: DC
  Administered 2021-05-10 – 2021-05-11 (×3): 25 mg via ORAL
  Filled 2021-05-10 (×3): qty 1

## 2021-05-10 MED ORDER — SODIUM CHLORIDE 0.9% FLUSH: 3.0000 mL | INTRAVENOUS | Status: DC | PRN

## 2021-05-10 MED ORDER — POTASSIUM CHLORIDE CRYS ER 20 MEQ PO TBCR
40.0000 meq | EXTENDED_RELEASE_TABLET | Freq: Once | ORAL | Status: AC
  Administered 2021-05-10: 40 meq via ORAL
  Filled 2021-05-10: qty 2

## 2021-05-10 MED ORDER — FUROSEMIDE 40 MG PO TABS
40.0000 mg | ORAL_TABLET | Freq: Every day | ORAL | Status: DC
  Administered 2021-05-11: 40 mg via ORAL
  Filled 2021-05-10 (×2): qty 1

## 2021-05-10 MED ORDER — ~~LOC~~ CARDIAC SURGERY, PATIENT & FAMILY EDUCATION: Freq: Once | Status: AC

## 2021-05-10 NOTE — Progress Notes (Signed)
? ?   ?[address] ?      [place] [address] ?            [phone]   ?              ?2 Days Post-Op ?Procedure(s) (LRB): ?CORONARY ARTERY BYPASS GRAFTING x4 USING LEFT INTERNAL MAMMORY ARTERY AND BILATERAL GREATER SAPHENOUS VEIN (N/A) ?TRANSESOPHAGEAL ECHOCARDIOGRAM (TEE) ?ENDOVEIN HARVEST OF GREATER SAPHENOUS VEIN (Bilateral) ?CLIPPING OF ATRIAL APPENDAGE (Left) ? ? ?Events: ?No events ?_______________________________________________________________ ?Vitals: ?BP (!) 138/100   Pulse 85   Temp 98.8 ?F (37.1 ?C) (Oral)   Resp (!) 21   Ht '5\' 7"'$  (1.702 m)   Wt 78.7 kg   SpO2 94%   BMI 27.17 kg/m?  ?Filed Weights  ? 05/07/21 0529 05/08/21 0429 05/09/21 0500  ?Weight: 73.7 kg 74.3 kg 78.7 kg  ? ? ? ?- Neuro: alert NAD ? ?- Cardiovascular: sinus 70s ? Drips: none.   ?CVP:  [0 mmHg-36 mmHg] 0 mmHg ? ?- Pulm: EWOB ?  ? ?ABG ?   ?Component Value Date/Time  ? PHART 7.355 05/08/2021 1848  ? PCO2ART 38.2 05/08/2021 1848  ? PO2ART 94 05/08/2021 1848  ? HCO3 21.5 05/08/2021 1848  ? TCO2 23 05/08/2021 1848  ? ACIDBASEDEF 4.0 (H) 05/08/2021 1848  ? O2SAT 97 05/08/2021 1848  ? ? ?- Abd: ND ?- Extremity: warm ? ?Marland KitchenIntake/Output   ?   04/19 0701 ?04/20 0700 04/20 0701 ?04/21 0700  ? P.O. 720   ? I.V. (mL/kg) 325.3 (4.1)   ? Blood    ? Other 120   ? IV Piggyback 199.9   ? Total Intake(mL/kg) 1365.2 (17.3)   ? Urine (mL/kg/hr) 2755 (1.5)   ? Emesis/NG output    ? Blood    ? Chest Tube 110   ? Total Output 2865   ? Net -1499.9   ?     ?  ? ? ?_______________________________________________________________ ?Labs: ? ?  Latest Ref Rng & Units 05/10/2021  ?  3:04 AM 05/09/2021  ?  5:56 PM 05/09/2021  ?  3:16 AM  ?CBC  ?WBC 4.0 - 10.5 K/uL 8.7   8.8   7.8    ?Hemoglobin 13.0 - 17.0 g/dL 10.6   11.2   12.0    ?Hematocrit 39.0 - 52.0 % 31.4   32.7   34.4    ?Platelets 150 - 400 K/uL 106   111   107    ? ? ?  Latest Ref Rng & Units 05/10/2021  ?  3:04 AM 05/09/2021  ?  5:56 PM 05/09/2021  ?  3:16 AM  ?CMP  ?Glucose 70 - 99  mg/dL 105   126   138    ?BUN 8 - 23 mg/dL '15   16   13    '$ ?Creatinine 0.61 - 1.24 mg/dL 0.89   0.98   0.94    ?Sodium 135 - 145 mmol/L 138   132   134    ?Potassium 3.5 - 5.1 mmol/L 3.8   3.7   3.9    ?Chloride 98 - 111 mmol/L 105   99   104    ?CO2 22 - 32 mmol/L '26   25   23    '$ ?Calcium 8.9 - 10.3 mg/dL 8.3   8.4   8.0    ? ? ?CXR: ?PV congestion ? ?_______________________________________________________________ ? ?Assessment and Plan: ?POD 2 s/p CABG ? ?Neuro: pain controlled ?CV: will remove wires.  On A/S/BB.  Rate improved ?Pulm: pulm hyg.  Will remove chest tubes ?Renal: creat stable ?GI: on diet ?Heme: stable ?ID: Afebrile ?Endo: SSI ?Dispo: floor today. ? ? ?Lucile Crater Nusayba Cadenas ?05/10/2021 8:23 AM ? ? ?

## 2021-05-10 NOTE — Progress Notes (Signed)
Epicardial pacing wires removed today at 1000 per MD order. Wires were intact upon removal and patient had no complications. CVC and introducer were removed today at 1015 per MD order. Catheter tip intact and patient tolerated removal well. Patient will remain on bedrest for 30 minutes. ?

## 2021-05-10 NOTE — Progress Notes (Signed)
? ?   ?  [address] ?      [place] [address] ?            [phone]   ? ?  ?POD # 2 CABG, LAA clip ? ?No complaints ? ?BP 120/71   Pulse 85   Temp 98.2 ?F (36.8 ?C) (Oral)   Resp 16   Ht '5\' 7"'$  (1.702 m)   Wt 78.7 kg   SpO2 91%   BMI 27.17 kg/m?  ?More frequent PVCs ? ? ?Intake/Output Summary (Last 24 hours) at 05/10/2021 1832 ?Last data filed at 05/10/2021 1700 ?Gross per 24 hour  ?Intake 924.85 ml  ?Output 3170 ml  ?Net -2245.15 ml  ? ?No PM labs ?Will check K ?Increase metoprolol to 25 BID ? ?Revonda Standard Roxan Hockey, MD ?[facility] ?[phone] ? ? ?

## 2021-05-10 NOTE — Progress Notes (Signed)
Per MD order, left pleural and medial mediastinal chest tubes removed. Patient performed the valsalva maneuver during removal. Tubes were removed at 1630 and patient tolerated the removal well. Placed gauze bandage over site. ?

## 2021-05-10 NOTE — Progress Notes (Signed)
PCCM: ? ?CCM will sign off.  ?Please call with any questions ? ?Garner Nash, DO ?[facility] ?05/10/2021 9:06 AM   ? ?

## 2021-05-11 LAB — TYPE AND SCREEN
ABO/RH(D): O POS
Antibody Screen: NEGATIVE
Unit division: 0
Unit division: 0

## 2021-05-11 LAB — BPAM RBC
Blood Product Expiration Date: 202305182359
Blood Product Expiration Date: 202305182359
Unit Type and Rh: 5100
Unit Type and Rh: 5100

## 2021-05-11 LAB — GLUCOSE, CAPILLARY
Glucose-Capillary: 95 mg/dL (ref 70–99)
Glucose-Capillary: 171 mg/dL — ABNORMAL HIGH (ref 70–99)
Glucose-Capillary: 98 mg/dL (ref 70–99)
Glucose-Capillary: 164 mg/dL — ABNORMAL HIGH (ref 70–99)

## 2021-05-11 MED FILL — Mannitol IV Soln 20%: INTRAVENOUS | Qty: 500 | Status: AC

## 2021-05-11 MED FILL — Albumin, Human Inj 5%: INTRAVENOUS | Qty: 250 | Status: AC

## 2021-05-11 MED FILL — Sodium Chloride IV Soln 0.9%: INTRAVENOUS | Qty: 2000 | Status: AC

## 2021-05-11 MED FILL — Sodium Bicarbonate IV Soln 8.4%: INTRAVENOUS | Qty: 50 | Status: AC

## 2021-05-11 MED FILL — Potassium Chloride Inj 2 mEq/ML: INTRAVENOUS | Qty: 40 | Status: AC

## 2021-05-11 MED FILL — Electrolyte-R (PH 7.4) Solution: INTRAVENOUS | Qty: 5000 | Status: AC

## 2021-05-11 MED FILL — Lidocaine HCl Local Preservative Free (PF) Inj 2%: INTRAMUSCULAR | Qty: 15 | Status: AC

## 2021-05-11 MED FILL — Heparin Sodium (Porcine) Inj 1000 Unit/ML: Qty: 1000 | Status: AC

## 2021-05-11 MED FILL — Calcium Chloride Inj 10%: INTRAVENOUS | Qty: 10 | Status: AC

## 2021-05-11 MED FILL — Heparin Sodium (Porcine) Inj 1000 Unit/ML: INTRAMUSCULAR | Qty: 10 | Status: AC

## 2021-05-11 NOTE — Progress Notes (Signed)
? ?   ?[address] ?      [place] [address] ?            [phone]   ?              ?3 Days Post-Op ?Procedure(s) (LRB): ?CORONARY ARTERY BYPASS GRAFTING x4 USING LEFT INTERNAL MAMMORY ARTERY AND BILATERAL GREATER SAPHENOUS VEIN (N/A) ?TRANSESOPHAGEAL ECHOCARDIOGRAM (TEE) ?ENDOVEIN HARVEST OF GREATER SAPHENOUS VEIN (Bilateral) ?CLIPPING OF ATRIAL APPENDAGE (Left) ? ? ?Events: ?No events ?_______________________________________________________________ ?Vitals: ?BP 129/83 (BP Location: Right Arm)   Pulse 85   Temp 98.1 ?F (36.7 ?C) (Oral)   Resp (!) 27   Ht '5\' 7"'$  (1.702 m)   Wt 73.7 kg   SpO2 96%   BMI 25.45 kg/m?  ?Filed Weights  ? 05/08/21 0429 05/09/21 0500 05/11/21 0500  ?Weight: 74.3 kg 78.7 kg 73.7 kg  ? ? ? ?- Neuro: alert NAD ? ?- Cardiovascular: sinus 80s ? Drips: none.   ?  ? ?- Pulm: EWOB ?  ? ?ABG ?   ?Component Value Date/Time  ? PHART 7.355 05/08/2021 1848  ? PCO2ART 38.2 05/08/2021 1848  ? PO2ART 94 05/08/2021 1848  ? HCO3 21.5 05/08/2021 1848  ? TCO2 23 05/08/2021 1848  ? ACIDBASEDEF 4.0 (H) 05/08/2021 1848  ? O2SAT 97 05/08/2021 1848  ? ? ?- Abd: ND ?- Extremity: warm ? ?Marland KitchenIntake/Output   ?   04/20 0701 ?04/21 0700 04/21 0701 ?04/22 0700  ? P.O. 780 360  ? I.V. (mL/kg)    ? Other    ? IV Piggyback    ? Total Intake(mL/kg) 780 (10.6) 360 (4.9)  ? Urine (mL/kg/hr) 1975 (1.1) 1125 (1.8)  ? Stool 0 0  ? Chest Tube 70   ? Total Output 2045 1125  ? Net -6440 -347  ?     ? Stool Occurrence 6 x 0 x  ?  ? ? ?_______________________________________________________________ ?Labs: ? ?  Latest Ref Rng & Units 05/10/2021  ? 11:17 PM 05/10/2021  ?  3:04 AM 05/09/2021  ?  5:56 PM  ?CBC  ?WBC 4.0 - 10.5 K/uL 10.5   8.7   8.8    ?Hemoglobin 13.0 - 17.0 g/dL 11.2   10.6   11.2    ?Hematocrit 39.0 - 52.0 % 32.8   31.4   32.7    ?Platelets 150 - 400 K/uL 121   106   111    ? ? ?  Latest Ref Rng & Units 05/10/2021  ? 11:17 PM 05/10/2021  ?  3:04 AM 05/09/2021  ?  5:56 PM  ?CMP  ?Glucose 70 - 99 mg/dL  96   105   126    ?BUN 8 - 23 mg/dL '17   15   16    '$ ?Creatinine 0.61 - 1.24 mg/dL 0.93   0.89   0.98    ?Sodium 135 - 145 mmol/L 137   138   132    ?Potassium 3.5 - 5.1 mmol/L 3.7   3.8   3.7    ?Chloride 98 - 111 mmol/L 104   105   99    ?CO2 22 - 32 mmol/L '27   26   25    '$ ?Calcium 8.9 - 10.3 mg/dL 8.5   8.3   8.4    ? ? ?CXR: ?PV congestion ? ?_______________________________________________________________ ? ?Assessment and Plan: ?POD 3 s/p CABG ? ?Neuro: pain controlled ?CV: will remove wires.  On A/S/BB.  Rate improved ?  Pulm: pulm hyg.   ?Renal: creat stable ?GI: on diet ?Heme: stable ?ID: Afebrile ?Endo: SSI ?Dispo: floor today. ? ? ?Nathaniel Bender ?05/11/2021 3:42 PM ? ? ?

## 2021-05-11 NOTE — Progress Notes (Signed)
Pt transferred to [facility]iel Bender, telemetry applied, CCMD notified.  Vital signs assessed.  Pt oriented to room/unit.  Questions addressed.  No needs at this time.   ?

## 2021-05-11 NOTE — Progress Notes (Signed)
CARDIAC REHAB PHASE I  ? ?Went to offer ambulation. Pt ambulated hallway with RN without difficulty. Maintaining sats on RA. Will continue to follow. ? [phone] ?Rufina Falco, RN BSN ?05/11/2021 ?2:37 PM ? ?

## 2021-05-12 DIAGNOSIS — Z951 Presence of aortocoronary bypass graft: Secondary | ICD-10-CM

## 2021-05-12 LAB — GLUCOSE, CAPILLARY
Glucose-Capillary: 81 mg/dL (ref 70–99)
Glucose-Capillary: 121 mg/dL — ABNORMAL HIGH (ref 70–99)

## 2021-05-12 MED ORDER — POTASSIUM CHLORIDE CRYS ER 20 MEQ PO TBCR
20.0000 meq | EXTENDED_RELEASE_TABLET | Freq: Two times a day (BID) | ORAL | Status: DC
  Administered 2021-05-12 – 2021-05-13 (×3): 20 meq via ORAL
  Filled 2021-05-12 (×3): qty 1

## 2021-05-12 MED ORDER — LOSARTAN POTASSIUM 50 MG PO TABS
50.0000 mg | ORAL_TABLET | Freq: Every day | ORAL | Status: DC
  Administered 2021-05-12 – 2021-05-13 (×2): 50 mg via ORAL
  Filled 2021-05-12 (×2): qty 1

## 2021-05-12 MED ORDER — INSULIN ASPART 100 UNIT/ML IJ SOLN: 0.0000 [IU] | Freq: Three times a day (TID) | INTRAMUSCULAR | Status: DC

## 2021-05-12 MED ORDER — INSULIN ASPART 100 UNIT/ML IJ SOLN
0.0000 [IU] | Freq: Three times a day (TID) | INTRAMUSCULAR | Status: DC
  Administered 2021-05-12: 2 [IU] via SUBCUTANEOUS

## 2021-05-12 MED ORDER — METOPROLOL TARTRATE 25 MG PO TABS
37.5000 mg | ORAL_TABLET | Freq: Two times a day (BID) | ORAL | Status: DC
  Administered 2021-05-12 – 2021-05-13 (×3): 37.5 mg via ORAL
  Filled 2021-05-12 (×3): qty 1

## 2021-05-12 NOTE — Progress Notes (Addendum)
? ?   ?[address] ?      [place] [address] ?            [phone]   ? ?  4 Days Post-Op Procedure(s) (LRB): ?CORONARY ARTERY BYPASS GRAFTING x4 USING LEFT INTERNAL MAMMORY ARTERY AND BILATERAL GREATER SAPHENOUS VEIN (N/A) ?TRANSESOPHAGEAL ECHOCARDIOGRAM (TEE) ?ENDOVEIN HARVEST OF GREATER SAPHENOUS VEIN (Bilateral) ?CLIPPING OF ATRIAL APPENDAGE (Left) ?Subjective: ?Feels well ? ?Objective: ?Vital signs in last 24 hours: ?Temp:  [98.1 ?F (36.7 ?C)-99 ?F (37.2 ?C)] 98.3 ?F (36.8 ?C) (04/22 9678) ?Pulse Rate:  [78-100] 91 (04/22 0823) ?Cardiac Rhythm: Normal sinus rhythm (04/21 1945) ?Resp:  [19-28] 19 (04/22 9381) ?BP: (128-154)/(83-103) 131/84 (04/22 0175) ?SpO2:  [93 %-100 %] 93 % (04/22 0823) ? ?Hemodynamic parameters for last 24 hours: ?  ? ?Intake/Output from previous day: ?04/21 0701 - 04/22 0700 ?In: 603.5 [P.O.:600; I.V.:3.5] ?Out: 1125 [Urine:1125] ?Intake/Output this shift: ?No intake/output data recorded. ? ?General appearance: alert, cooperative, and no distress ?Heart: regular rate and rhythm and frequent extrasystoles ?Lungs: clear to auscultation bilaterally ?Abdomen: benign ?Extremities: no significant edema ?Wound: incis healing well, some right thigh echymosis ? ?Lab Results: ?Recent Labs  ?  05/10/21 ?0304 05/10/21 ?2317  ?WBC 8.7 10.5  ?HGB 10.6* 11.2*  ?HCT 31.4* 32.8*  ?PLT 106* 121*  ? ?BMET:  ?Recent Labs  ?  05/10/21 ?0304 05/10/21 ?2317  ?NA 138 137  ?K 3.8 3.7  ?CL 105 104  ?CO2 26 27  ?GLUCOSE 105* 96  ?BUN 15 17  ?CREATININE 0.89 0.93  ?CALCIUM 8.3* 8.5*  ?  ?PT/INR: No results for input(s): LABPROT, INR in the last 72 hours. ?ABG ?   ?Component Value Date/Time  ? PHART 7.355 05/08/2021 1848  ? HCO3 21.5 05/08/2021 1848  ? TCO2 23 05/08/2021 1848  ? ACIDBASEDEF 4.0 (H) 05/08/2021 1848  ? O2SAT 97 05/08/2021 1848  ? ?CBG (last 3)  ?Recent Labs  ?  05/11/21 ?0658 05/11/21 ?1135 05/11/21 ?2033  ?GLUCAP 171* 98 164*  ? ? ?Meds ?Scheduled Meds: ? acetaminophen  1,000 mg  Oral Q6H  ? Or  ? acetaminophen (TYLENOL) oral liquid 160 mg/5 mL  1,000 mg Per Tube Q6H  ? aspirin EC  325 mg Oral Daily  ? Or  ? aspirin  324 mg Per Tube Daily  ? atorvastatin  80 mg Oral Daily  ? bisacodyl  10 mg Oral Daily  ? Or  ? bisacodyl  10 mg Rectal Daily  ? Chlorhexidine Gluconate Cloth  6 each Topical Daily  ? docusate sodium  200 mg Oral Daily  ? enoxaparin (LOVENOX) injection  40 mg Subcutaneous QHS  ? folic acid  1 mg Per Tube Daily  ? furosemide  40 mg Oral Daily  ? insulin aspart  0-24 Units Subcutaneous Q4H  ? mouth rinse  15 mL Mouth Rinse BID  ? metoprolol tartrate  25 mg Oral BID  ? pantoprazole  40 mg Oral Daily  ? sodium chloride flush  10-40 mL Intracatheter Q12H  ? sodium chloride flush  3 mL Intravenous Q12H  ? sodium chloride flush  3 mL Intravenous Q12H  ? thiamine  100 mg Per Tube Daily  ? ?Continuous Infusions: ? sodium chloride 10 mL/hr at 05/10/21 0500  ? sodium chloride    ? sodium chloride    ? sodium chloride    ? lactated ringers    ? lactated ringers    ? lactated ringers Stopped (05/09/21 1417)  ? ?  PRN Meds:.sodium chloride, sodium chloride, dextrose, lactated ringers, metoprolol tartrate, midazolam, morphine injection, oxyCODONE, prochlorperazine, sodium chloride flush, sodium chloride flush, sodium chloride flush, traMADol ? ?Xrays ?No results found. ? ?Assessment/Plan: ?S/P Procedure(s) (LRB): ?CORONARY ARTERY BYPASS GRAFTING x4 USING LEFT INTERNAL MAMMORY ARTERY AND BILATERAL GREATER SAPHENOUS VEIN (N/A) ?TRANSESOPHAGEAL ECHOCARDIOGRAM (TEE) ?ENDOVEIN HARVEST OF GREATER SAPHENOUS VEIN (Bilateral) ?CLIPPING OF ATRIAL APPENDAGE (Left) ?POD4 ? ?1 afeb, VSS S BP 120's-150's, on higher end most recently , sinus rhythm with frequent PVC's- will resume losaartan for now and increase beta blocker ?2 sats good on RA ?3 good UOP, not all measured, not weighed yet, cont diuresis ?4 no new labs or CXR's today ?5 BS ok control, HGB A1c preop 6.1 on no DM meds- prediabetic will need  ongoing primary F/U and good nutrition ?6 cont rehab and pulm hygiene, poss home tomorrow, repeat labs in am ? ? ? ? LOS: 6 days  ? ? ?John Giovanni PA-C ?Pager [phone] ?05/12/2021 ?  ? Chart reviewed, patient examined, agree with above. ?He looks good and feels well. Plan home tomorrow if no changes. ?

## 2021-05-12 NOTE — Progress Notes (Signed)
CARDIAC REHAB PHASE I  ? ?PRE:  Rate/Rhythm: 83 SR ? ?BP:  Supine: 121/88 Sitting:   Standing:  ?  SaO2: 96% RA ? ?MODE:  Ambulation: 450 ft  ? ?POST:  Rate/Rhythm: 95 SR ? ?BP:  Supine:   Sitting: 155/101 Standing:  ?  SaO2: 96% RA ? ?2080-2233 Patient tolerated ambulation well with assist x1 and pushing rollator, gait steady, no symptoms. Blood pressure elevated before and after ambulation. Rechecked BP supine after reviewing education and it was 135/92.  ?CABG education complete including sternal precautions, IS use, deep breathing, coughing, restrictions, risk factor modification, and activity progression. Instructed patient on how to view recovering from [facility] video. Heart healthy handout and exercise guidelines given. Patient verbalizes understanding of instructions given. Discussed phase 2 cardiac rehab, and patient is interested in the program at [facility], referral sent.  ? ?Sol Passer, MS, ACSM CEP ? ?

## 2021-05-13 LAB — CBC
HCT: 31.4 % — ABNORMAL LOW (ref 39.0–52.0)
Hemoglobin: 10.4 g/dL — ABNORMAL LOW (ref 13.0–17.0)
MCH: 29.9 pg (ref 26.0–34.0)
MCHC: 33.1 g/dL (ref 30.0–36.0)
MCV: 90.2 fL (ref 80.0–100.0)
Platelets: 184 10*3/uL (ref 150–400)
RBC: 3.48 MIL/uL — ABNORMAL LOW (ref 4.22–5.81)
RDW: 13.7 % (ref 11.5–15.5)
WBC: 8.4 10*3/uL (ref 4.0–10.5)
nRBC: 0 % (ref 0.0–0.2)

## 2021-05-13 LAB — BASIC METABOLIC PANEL
Anion gap: 6 (ref 5–15)
BUN: 25 mg/dL — ABNORMAL HIGH (ref 8–23)
CO2: 25 mmol/L (ref 22–32)
Calcium: 8.7 mg/dL — ABNORMAL LOW (ref 8.9–10.3)
Chloride: 107 mmol/L (ref 98–111)
Creatinine, Ser: 1.04 mg/dL (ref 0.61–1.24)
GFR, Estimated: >60 mL/min (ref >60)
Glucose, Bld: 124 mg/dL — ABNORMAL HIGH (ref 70–99)
Potassium: 3.8 mmol/L (ref 3.5–5.1)
Sodium: 138 mmol/L (ref 135–145)

## 2021-05-13 LAB — MAGNESIUM: Magnesium: 2 mg/dL (ref 1.7–2.4)

## 2021-05-13 MED ORDER — AMLODIPINE BESYLATE 5 MG PO TABS
5.0000 mg | ORAL_TABLET | Freq: Every day | ORAL | Status: DC
  Administered 2021-05-13: 5 mg via ORAL
  Filled 2021-05-13: qty 1

## 2021-05-13 MED ORDER — METOPROLOL TARTRATE 37.5 MG PO TABS: 37.5000 mg | ORAL_TABLET | Freq: Two times a day (BID) | ORAL | 1 refills | Status: DC

## 2021-05-13 MED ORDER — OXYCODONE HCL 5 MG PO TABS: 5.0000 mg | ORAL_TABLET | Freq: Four times a day (QID) | ORAL | 0 refills | Status: AC | PRN

## 2021-05-13 MED ORDER — ATORVASTATIN CALCIUM 80 MG PO TABS: 80.0000 mg | ORAL_TABLET | Freq: Every day | ORAL | 1 refills | Status: DC

## 2021-05-13 MED ORDER — ASPIRIN 325 MG PO TBEC: 325.0000 mg | DELAYED_RELEASE_TABLET | Freq: Every day | ORAL | Status: DC

## 2021-05-13 NOTE — Plan of Care (Signed)
  Problem: Education: Goal: Knowledge of General Education information will improve Description: Including pain rating scale, medication(s)/side effects and non-pharmacologic comfort measures Outcome: Progressing   Problem: Health Behavior/Discharge Planning: Goal: Ability to manage health-related needs will improve Outcome: Progressing   Problem: Clinical Measurements: Goal: Ability to maintain clinical measurements within normal limits will improve Outcome: Progressing Goal: Will remain free from infection Outcome: Progressing Goal: Diagnostic test results will improve Outcome: Progressing Goal: Respiratory complications will improve Outcome: Progressing Goal: Cardiovascular complication will be avoided Outcome: Progressing   Problem: Activity: Goal: Risk for activity intolerance will decrease Outcome: Progressing   Problem: Nutrition: Goal: Adequate nutrition will be maintained Outcome: Progressing   Problem: Coping: Goal: Level of anxiety will decrease Outcome: Progressing   Problem: Elimination: Goal: Will not experience complications related to bowel motility Outcome: Progressing Goal: Will not experience complications related to urinary retention Outcome: Progressing   Problem: Pain Managment: Goal: General experience of comfort will improve Outcome: Progressing   Problem: Safety: Goal: Ability to remain free from injury will improve Outcome: Progressing   Problem: Skin Integrity: Goal: Risk for impaired skin integrity will decrease Outcome: Progressing   Problem: Education: Goal: Knowledge of disease or condition will improve Outcome: Progressing Goal: Understanding of medication regimen will improve Outcome: Progressing Goal: Individualized Educational Video(s) Outcome: Progressing   Problem: Activity: Goal: Ability to tolerate increased activity will improve Outcome: Progressing   Problem: Cardiac: Goal: Ability to achieve and maintain  adequate cardiopulmonary perfusion will improve Outcome: Progressing   Problem: Health Behavior/Discharge Planning: Goal: Ability to safely manage health-related needs after discharge will improve Outcome: Progressing   Problem: Education: Goal: Will demonstrate proper wound care and an understanding of methods to prevent future damage Outcome: Progressing Goal: Knowledge of disease or condition will improve Outcome: Progressing Goal: Knowledge of the prescribed therapeutic regimen will improve Outcome: Progressing Goal: Individualized Educational Video(s) Outcome: Progressing   Problem: Activity: Goal: Risk for activity intolerance will decrease Outcome: Progressing   Problem: Cardiac: Goal: Will achieve and/or maintain hemodynamic stability Outcome: Progressing   Problem: Clinical Measurements: Goal: Postoperative complications will be avoided or minimized Outcome: Progressing   Problem: Respiratory: Goal: Respiratory status will improve Outcome: Progressing   Problem: Skin Integrity: Goal: Wound healing without signs and symptoms of infection Outcome: Progressing Goal: Risk for impaired skin integrity will decrease Outcome: Progressing   Problem: Urinary Elimination: Goal: Ability to achieve and maintain adequate renal perfusion and functioning will improve Outcome: Progressing   

## 2021-05-13 NOTE — Progress Notes (Addendum)
? ?   ?[address] ?      [place] [address] ?            [phone]   ? ?  5 Days Post-Op Procedure(s) (LRB): ?CORONARY ARTERY BYPASS GRAFTING x4 USING LEFT INTERNAL MAMMORY ARTERY AND BILATERAL GREATER SAPHENOUS VEIN (N/A) ?TRANSESOPHAGEAL ECHOCARDIOGRAM (TEE) ?ENDOVEIN HARVEST OF GREATER SAPHENOUS VEIN (Bilateral) ?CLIPPING OF ATRIAL APPENDAGE (Left) ?Subjective: ?Feels well, anxious to go home ? ?Objective: ?Vital signs in last 24 hours: ?Temp:  [98.2 ?F (36.8 ?C)-98.9 ?F (37.2 ?C)] 98.2 ?F (36.8 ?C) (04/23 0249) ?Pulse Rate:  [61-91] 72 (04/23 0249) ?Cardiac Rhythm: Normal sinus rhythm (04/22 2112) ?Resp:  [19-20] 20 (04/23 0249) ?BP: (125-148)/(73-99) 148/89 (04/23 0249) ?SpO2:  [93 %-100 %] 95 % (04/23 0249) ?Weight:  [71.4 kg] 71.4 kg (04/23 0625) ? ?Hemodynamic parameters for last 24 hours: ?  ? ?Intake/Output from previous day: ?No intake/output data recorded. ?Intake/Output this shift: ?No intake/output data recorded. ? ?General appearance: alert, cooperative, and no distress ?Heart: regular rate and rhythm and occas extrasystole ?Lungs: min dim in bases ?Abdomen: benign ?Extremities: no edema ?Wound: incis healing well ? ?Lab Results: ?Recent Labs  ?  05/10/21 ?2317 05/13/21 ?0515  ?WBC 10.5 8.4  ?HGB 11.2* 10.4*  ?HCT 32.8* 31.4*  ?PLT 121* 184  ? ?BMET:  ?Recent Labs  ?  05/10/21 ?2317 05/13/21 ?0515  ?NA 137 138  ?K 3.7 3.8  ?CL 104 107  ?CO2 27 25  ?GLUCOSE 96 124*  ?BUN 17 25*  ?CREATININE 0.93 1.04  ?CALCIUM 8.5* 8.7*  ?  ?PT/INR: No results for input(s): LABPROT, INR in the last 72 hours. ?ABG ?   ?Component Value Date/Time  ? PHART 7.355 05/08/2021 1848  ? HCO3 21.5 05/08/2021 1848  ? TCO2 23 05/08/2021 1848  ? ACIDBASEDEF 4.0 (H) 05/08/2021 1848  ? O2SAT 97 05/08/2021 1848  ? ?CBG (last 3)  ?Recent Labs  ?  05/11/21 ?2033 05/12/21 ?1137 05/12/21 ?1820  ?GLUCAP 164* 81 121*  ? ? ?Meds ?Scheduled Meds: ? acetaminophen  1,000 mg Oral Q6H  ? Or  ? acetaminophen (TYLENOL) oral  liquid 160 mg/5 mL  1,000 mg Per Tube Q6H  ? aspirin EC  325 mg Oral Daily  ? Or  ? aspirin  324 mg Per Tube Daily  ? atorvastatin  80 mg Oral Daily  ? bisacodyl  10 mg Oral Daily  ? Or  ? bisacodyl  10 mg Rectal Daily  ? docusate sodium  200 mg Oral Daily  ? enoxaparin (LOVENOX) injection  40 mg Subcutaneous QHS  ? folic acid  1 mg Per Tube Daily  ? furosemide  40 mg Oral Daily  ? insulin aspart  0-24 Units Subcutaneous TID PC & HS  ? losartan  50 mg Oral Daily  ? mouth rinse  15 mL Mouth Rinse BID  ? metoprolol tartrate  37.5 mg Oral BID  ? pantoprazole  40 mg Oral Daily  ? potassium chloride  20 mEq Oral BID  ? sodium chloride flush  10-40 mL Intracatheter Q12H  ? sodium chloride flush  3 mL Intravenous Q12H  ? sodium chloride flush  3 mL Intravenous Q12H  ? thiamine  100 mg Per Tube Daily  ? ?Continuous Infusions: ? sodium chloride 10 mL/hr at 05/10/21 0500  ? sodium chloride    ? sodium chloride    ? sodium chloride    ? lactated ringers    ? lactated ringers Stopped (05/09/21  1417)  ? ?PRN Meds:.sodium chloride, sodium chloride, dextrose, metoprolol tartrate, morphine injection, oxyCODONE, prochlorperazine, sodium chloride flush, sodium chloride flush, sodium chloride flush, traMADol ? ?Xrays ?No results found. ? ?Assessment/Plan: ?S/P Procedure(s) (LRB): ?CORONARY ARTERY BYPASS GRAFTING x4 USING LEFT INTERNAL MAMMORY ARTERY AND BILATERAL GREATER SAPHENOUS VEIN (N/A) ?TRANSESOPHAGEAL ECHOCARDIOGRAM (TEE) ?ENDOVEIN HARVEST OF GREATER SAPHENOUS VEIN (Bilateral) ?CLIPPING OF ATRIAL APPENDAGE (Left) ?POD#5 ? ?1 afeb S BP 120's- 140's (improved on losaartan), will resume previous norvasc now as well,  sinus rhythm with PVC's, normal EF by echo ?2 sats good on RA ?3 weight below preop, UOP not recorded ?4 lytes/renal fxn ok ?5 expected ABLA stable, not in transfusion threshold ?6 CXR some atx/ASD, minor ?7 stable for D/C ? ? LOS: 7 days  ? ? ?John Giovanni PA-C pager [phone] ?05/13/2021 ?  ? Chart reviewed,  patient examined, agree with above. ?Doing well. Plan home today. ?

## 2021-05-16 ENCOUNTER — Ambulatory Visit: Payer: Medicare Other | Admitting: Pulmonary Disease

## 2021-05-18 ENCOUNTER — Ambulatory Visit (INDEPENDENT_AMBULATORY_CARE_PROVIDER_SITE_OTHER): Payer: Self-pay | Admitting: Thoracic Surgery (Cardiothoracic Vascular Surgery)

## 2021-05-18 DIAGNOSIS — Z951 Presence of aortocoronary bypass graft: Secondary | ICD-10-CM

## 2021-05-18 NOTE — Progress Notes (Signed)
?   ?  [address] ?      [place] [address] ?            [phone]      ? ?Patient: Home ?Provider: Office ?Consent for Telemedicine visit obtained. ? ?Today?s visit was completed via a real-time telehealth (see specific modality noted below). The patient/authorized person provided oral consent at the time of the visit to engage in a telemedicine encounter with the present provider at [facility]. The patient/authorized person was informed of the potential benefits, limitations, and risks of telemedicine. The patient/authorized person expressed understanding that the laws that protect confidentiality also apply to telemedicine. The patient/authorized person acknowledged understanding that telemedicine does not provide emergency services and that he or she would need to call 911 or proceed to the nearest hospital for help if such a need arose. ? ? Total time spent in the clinical discussion 10 minutes. ? Telehealth Modality: Phone visit (audio only) ? ?I had a telephone visit with  Kayleen Memos who is s/p CABG, atriclip.  Overall doing well.  Pain is minimal.  Ambulating well. Vitals have been stable for the most part.  He does have some high blood pressure in the morning, but it comes down after taking his medication.  Krista Som will see [place] back in 1 month with a chest x-ray for cardiac rehab clearance. ? ?Lajuana Matte ? ?

## 2021-05-22 ENCOUNTER — Telehealth (HOSPITAL_COMMUNITY): Payer: Self-pay

## 2021-05-22 NOTE — Telephone Encounter (Signed)
Pt insurance is active and benefits verified through [facility] Medicare. Co-pay $0.00, DED $0.00/$0.00 met, out of pocket $3,600.00/$156.29 met, co-insurance 0%. No pre-authorization required. Passport, 05/22/21 @ 3:51PM, YKD#[id number] ?  ?Will contact patient to see if he is interested in the Cardiac Rehab Program. If interested, patient will need to complete follow up appt. Once completed, patient will be contacted for scheduling upon review by the RN Navigator. ?

## 2021-05-22 NOTE — Telephone Encounter (Signed)
Called patient to see if he is interested in the Cardiac Rehab Program. Patient expressed interest. Explained scheduling process and went over insurance, patient verbalized understanding. Will contact patient for scheduling once f/u has been completed.  °

## 2021-05-25 ENCOUNTER — Ambulatory Visit (HOSPITAL_BASED_OUTPATIENT_CLINIC_OR_DEPARTMENT_OTHER): Payer: Medicare Other | Admitting: Family

## 2021-05-25 VITALS — BP 130/72 | HR 64 | Ht 67.0 in | Wt 159.6 lb

## 2021-05-25 DIAGNOSIS — Z951 Presence of aortocoronary bypass graft: Secondary | ICD-10-CM | POA: Diagnosis not present

## 2021-05-25 DIAGNOSIS — I25118 Atherosclerotic heart disease of native coronary artery with other forms of angina pectoris: Secondary | ICD-10-CM | POA: Diagnosis not present

## 2021-05-25 DIAGNOSIS — E785 Hyperlipidemia, unspecified: Secondary | ICD-10-CM | POA: Diagnosis not present

## 2021-05-25 MED ORDER — FUROSEMIDE 20 MG PO TABS: 20.0000 mg | ORAL_TABLET | ORAL | 0 refills | Status: DC | PRN

## 2021-05-25 NOTE — Patient Instructions (Signed)
Medication Instructions:  ?Your physician has recommended you make the following change in your medication:  ? ?START Lasix '20mg'$  daily for 3 days -  ?*If this does not improve your swelling, please call our office.  ? ?*If you need a refill on your cardiac medications before your next appointment, please call your pharmacy* ? ? ?Lab Work: ?Your physician recommends that you return for lab work in 4 weeks for CBC, CMP, fasting lipid panel. ? ?Please return for Lab work. You may come to the...  ? ?[facility] (3rd floor) ?[address], [place], [place]  ?Open: 8am-Noon and 1pm-4:30pm  ? ?[place] [facility] ?[place]  ? ?[employer]- Any location ? ?**no appointments needed** ? ?If you have labs (blood work) drawn today and your tests are completely normal, you will receive your results only by: ?MyChart Message (if you have MyChart) OR ?A paper copy in the mail ?If you have any lab test that is abnormal or we need to change your treatment, we will call you to review the results. ? ? ?Testing/Procedures: ?Your EKG today showed normal sinus rhythm which is a good result. ? ? ?Follow-Up: ?At [facility], you and your health needs are our priority.  As part of our continuing mission to provide you with exceptional heart care, we have created designated Provider Care Teams.  These Care Teams include your primary Cardiologist (physician) and Advanced Practice Providers (APPs -  Physician Assistants and Nurse Practitioners) who all work together to provide you with the care you need, when you need it. ? ?We recommend signing up for the patient portal called "MyChart".  Sign up information is provided on this After Visit Summary.  MyChart is used to connect with patients for Virtual Visits (Telemedicine).  Patients are able to view lab/test results, encounter notes, upcoming appointments, etc.  Non-urgent messages can be sent to your provider as well.   ?To  learn more about what you can do with MyChart, go to [email or web address].   ? ?Your next appointment:   ?3 month(s) ? ?The format for your next appointment:   ?In Person ? ?Provider:   ?Buford Dresser, MD  ? ? ?Other Instructions ? ?Heart Healthy Diet Recommendations: ?A low-salt diet is recommended. Meats should be grilled, baked, or boiled. Avoid fried foods. Focus on lean protein sources like fish or chicken with vegetables and fruits. The American Heart Association is a Microbiologist!  American Heart Association Diet and Lifeystyle Recommendations  ? ?Exercise recommendations: ?The American Heart Association recommends 150 minutes of moderate intensity exercise weekly. ?Try 30 minutes of moderate intensity exercise 4-5 times per week. ?This could include walking, jogging, or swimming. ? ?Per your surgical team after open heart surgery you should not push/pull for 3 months. You should not lift more than 20 lbs until 3 months after surgery. If your surgeon has cleared you to drive, you should take breaks to mobilize every 1-2 hours if on a long trip.  ? ? ? ?

## 2021-05-25 NOTE — Progress Notes (Signed)
? ?Office Visit  ?  ?Patient Name: Nathaniel Bender ?Date of Encounter: 05/26/2021 ? ?PCP:  Bernerd Limbo, MD ?  ?[place]  ?Cardiologist:  Buford Dresser, MD  ?Advanced Practice Provider:  No care team member to display ?Electrophysiologist:  None  ?   ? ?Chief Complaint  ?  ?Nathaniel Bender is a 71 y.o. male with a hx of CAD s/p CABG X 4 (LIMA-LAD, reverse SVG-RI, OM3, PDA and 1m atrial clip placement) 04/2021, prediabetes, arthritis, atrial fibrillation, hyperlipidemia presents today for follow up after CABG  ? ?Past Medical History  ?  ?Past Medical History:  ?Diagnosis Date  ? Arthritis   ? Degenerative Disc Disease Cervical; s/p injection [facility].  ? Atypical mole 05/04/2013  ? Right Wrist-Severe  ? Cancer ([facility] 07/21/2013  ? Skin cancer unknown type  ? Colon polyps   ? GERD (gastroesophageal reflux disease)   ? Gout   ? Hyperlipidemia   ? Hypertension   ? Meniere's disease of left ear   ? s/p ENT evaluation; acute hearing loss; chronic tinnitus L now.  Recovered hearing completely.  ? SCCA (squamous cell carcinoma) of skin 08/08/2014  ? Left Forehead-Well Diff (curet and 5FU)  ? ?Past Surgical History:  ?Procedure Laterality Date  ? CLIPPING OF ATRIAL APPENDAGE Left 05/08/2021  ? Procedure: CLIPPING OF ATRIAL APPENDAGE;  Surgeon: LLajuana Matte MD;  [facility]  Service: Open Heart Surgery;  Laterality: Left;  ? CORONARY ARTERY BYPASS GRAFT N/A 05/08/2021  ? Procedure: CORONARY ARTERY BYPASS GRAFTING x4 USING LEFT INTERNAL MAMMORY ARTERY AND BILATERAL GREATER SAPHENOUS VEIN;  Surgeon: LLajuana Matte MD;  [facility]  Service: Open Heart Surgery;  Laterality: N/A;  atriclip placement.  flow trac  ? ENDOVEIN HARVEST OF GREATER SAPHENOUS VEIN Bilateral 05/08/2021  ? Procedure: ENDOVEIN HARVEST OF GREATER SAPHENOUS VEIN;  Surgeon: LLajuana Matte MD;  [facility]  Service: Open Heart Surgery;  Laterality: Bilateral;  ? LEFT HEART CATH AND  CORONARY ANGIOGRAPHY N/A 05/07/2021  ? Procedure: LEFT HEART CATH AND CORONARY ANGIOGRAPHY;  Surgeon: ENelva Bush MD;  [facility];  Service: Cardiovascular;  Laterality: N/A;  ? ROTATOR CUFF REPAIR Right   ? TEE WITHOUT CARDIOVERSION  05/08/2021  ? Procedure: TRANSESOPHAGEAL ECHOCARDIOGRAM (TEE);  Surgeon: LLajuana Matte MD;  [facility]  Service: Open Heart Surgery;;  ? TONSILLECTOMY    ? ? ?Allergies ? ?No Known Allergies ? ?History of Present Illness  ?  ?WJohntavious Francomis a 71y.o. male with a hx of CAD s/p CABG X 4 (LIMA-LAD, reverse SVG-RI, OM3, PDA and 494matrial clip placement) 04/2021, prediabetes, arthritis, atrial fibrillation, hyperlipidemia last seen while hospitalized ? ?Prior evaluation July 2020 by Dr. ChHarrell Gaveue to bradycardia.  Monitor at that time revealed average heart rate 57 bpm, brief episodes of SVT which were asymptomatic.  He was recommended to follow-up as needed. ? ?Dated 4/16 - 05/13/2021 after presenting with chest pain and NSTEMI.  In the ED he was found to be in atrial fibrillation with RVR.  Cardiac Catheterization revealed severe multivessel disease with left main disease.  Echo 05/06/2021 LVEF 55 to 6019%grade 1 diastolic dysfunction, no RWMA, normal PASP, mild calcification of aortic valve without stenosis.  Subsequently underwent CABG X4 with LIMA-LAD, reverse SVG-RI, OM 3, PDA and 45 mm atrial clip placement by Dr. LiKipp Brood Postoperative course overall unremarkable.  Did have some thrombocytopenia which self resolved. ? ?He presents  today for follow-up with his wife.  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  We reviewed his hospitalization and testing in detail.  He has been walking a total of 1 mile per day on his property but splitting it up into smaller walks throughout the day.  Still notes left chest wall tenderness and has been treating with  Tylenol and occasional Aleve.  He describes this feeling as "yellowjacket stings ".  RLE vein grafting site tender and stinging.  He has been placing an ice pack on this. ? ? ?EKGs/Labs/Other Studies Reviewed:  ? ?The following studies were reviewed today: ? ?LHC 05/07/21 ? Conclusion: ?Severe LMCA and codominant RCA disease.  LAD, ramus, and LCx demonstrate mild-moderate, non-obstructive disease. ?Normal left ventricular filling pressure. ?  ?Recommendations: ?Cardiac surgery consultation for CABG. ?Restart IV heparin 2 hours after TR band removal. ?Aggressive secondary prevention of coronary artery disease. ?  ?Dominance: Co-dominant ? ? ? ?Echo 05/07/2021 ? ? 1. Left ventricular ejection fraction, by estimation, is 55 to 60%. The  ?left ventricle has normal function. The left ventricle has no regional  ?wall motion abnormalities. Left ventricular diastolic parameters are  ?consistent with Grade I diastolic  ?dysfunction (impaired relaxation). The average left ventricular global  ?longitudinal strain is -20.5 %. The global longitudinal strain is normal.  ? 2. Right ventricular systolic function is normal. The right ventricular  ?size is normal. There is normal pulmonary artery systolic pressure. The  ?estimated right ventricular systolic pressure is 65.9 mmHg.  ? 3. The mitral valve is normal in structure. No evidence of mitral valve  ?regurgitation. No evidence of mitral stenosis.  ? 4. The aortic valve is tricuspid. There is mild calcification of the  ?aortic valve. Aortic valve regurgitation is not visualized. Aortic valve  ?sclerosis is present, with no evidence of aortic valve stenosis.  ? 5. The inferior vena cava is normal in size with greater than 50%  ?respiratory variability, suggesting right atrial pressure of 3 mmHg.  ? 6. The patient was markedly bradycardic.  ? ?EKG:  EKG is ordered today.  The ekg ordered today demonstrates SR 65 bpm with isolated PVC with LPFB and no acute ST/T wave changes ? ?Recent  Labs: ?05/06/2021: ALT 21; B Natriuretic Peptide 76.9 ?05/13/2021: BUN 25; Creatinine, Ser 1.04; Hemoglobin 10.4; Magnesium 2.0; Platelets 184; Potassium 3.8; Sodium 138  ?Recent Lipid Panel ?   ?Component Value Date/Time  ? CHOL 178 05/06/2021 2210  ? CHOL 172 07/30/2019 0824  ? TRIG 148 05/06/2021 2210  ? HDL 53 05/06/2021 2210  ? HDL 44 07/30/2019 0824  ? CHOLHDL 3.4 05/06/2021 2210  ? VLDL 30 05/06/2021 2210  ? [place] 95 05/06/2021 2210  ? [place] 96 07/30/2019 0824  ? ? ? ?Home Medications  ? ?Current Meds  ?Medication Sig  ? amLODipine (NORVASC) 5 MG tablet Take 1 tablet (5 mg total) by mouth daily.  ? aspirin EC 325 MG EC tablet Take 1 tablet (325 mg total) by mouth daily.  ? atorvastatin (LIPITOR) 80 MG tablet Take 1 tablet (80 mg total) by mouth daily.  ? furosemide (LASIX) 20 MG tablet Take 1 tablet (20 mg total) by mouth as needed.  ? losartan (COZAAR) 50 MG tablet Take 50 mg by mouth daily.  ? Metoprolol Tartrate 37.5 MG TABS Take 37.5 mg by mouth 2 (two) times daily.  ? omeprazole (PRILOSEC) 20 MG capsule Take 1  capsule (20 mg total) by mouth daily.  ? Turmeric 500 MG TABS Take 500 mg by mouth daily.  ?  ? ?Review of Systems  ?   ?All other systems reviewed and are otherwise negative except as noted above. ? ?Physical Exam  ?  ?VS:  BP 130/72 (BP Location: Left Arm, Patient Position: Sitting, Cuff Size: Normal)   Pulse 64   Ht '5\' 7"'$  (1.702 m)   Wt 159 lb 9.6 oz (72.4 kg)   BMI 25.00 kg/m?  , BMI Body mass index is 25 kg/m?. ? ?Wt Readings from Last 3 Encounters:  ?05/25/21 159 lb 9.6 oz (72.4 kg)  ?05/13/21 157 lb 6.5 oz (71.4 kg)  ?04/05/21 165 lb 8 oz (75.1 kg)  ?  ? ?GEN: Well nourished, well developed, in no acute distress. ?HEENT: normal. ?Neck: Supple, no JVD, carotid bruits, or masses. ?Cardiac: RRR, no murmurs, rubs, or gallops. No clubbing, cyanosis, edema.  Radials/PT 2+ and equal bilaterally.  ?Respiratory:  Respirations regular and unlabored, clear to auscultation bilaterally. ?GI: Soft,  nontender, nondistended. ?MS: No deformity or atrophy. ?Skin: Warm and dry, no rash.  Midsternal incision and chest tube sutures healing appropriately with well approximated edges and no evidence of infection.  B

## 2021-05-26 ENCOUNTER — Encounter (HOSPITAL_BASED_OUTPATIENT_CLINIC_OR_DEPARTMENT_OTHER): Payer: Self-pay | Admitting: Family

## 2021-05-26 MED ORDER — METOPROLOL TARTRATE 37.5 MG PO TABS
37.5000 mg | ORAL_TABLET | Freq: Two times a day (BID) | ORAL | 1 refills | Status: DC
Start: 2021-05-26 — End: 2021-12-10

## 2021-05-26 MED ORDER — ATORVASTATIN CALCIUM 80 MG PO TABS: 80.0000 mg | ORAL_TABLET | Freq: Every day | ORAL | 3 refills | Status: DC

## 2021-05-31 ENCOUNTER — Encounter (HOSPITAL_BASED_OUTPATIENT_CLINIC_OR_DEPARTMENT_OTHER): Payer: Self-pay

## 2021-05-31 ENCOUNTER — Ambulatory Visit (INDEPENDENT_AMBULATORY_CARE_PROVIDER_SITE_OTHER): Payer: Medicare Other

## 2021-05-31 DIAGNOSIS — M778 Other enthesopathies, not elsewhere classified: Secondary | ICD-10-CM | POA: Diagnosis not present

## 2021-05-31 NOTE — Progress Notes (Signed)
SITUATION: ?Reason for Visit: Fitting and Delivery of Custom Fabricated Foot Orthoses ?Patient Report: Patient reports comfort and is satisfied with device. ? ?OBJECTIVE DATA: ?Patient History / Diagnosis:   ?  ICD-10-CM   ?1. Capsulitis of foot, unspecified laterality  M77.8   ?  ? ? ?Provided Device:  Custom Functional Foot Orthotics ?    [facility]: [id number] ? ?GOAL OF ORTHOSIS ?- Improve gait ?- Decrease energy expenditure ?- Improve Balance ?- Provide Triplanar stability of foot complex ?- Facilitate motion ? ?ACTIONS PERFORMED ?Patient was fit with foot orthotics trimmed to shoe last. Patient tolerated fittign procedure.  ? ?Patient was provided with verbal and written instruction and demonstration regarding donning, doffing, wear, care, proper fit, function, purpose, cleaning, and use of the orthosis and in all related precautions and risks and benefits regarding the orthosis. ? ?Patient was also provided with verbal instruction regarding how to report any failures or malfunctions of the orthosis and necessary follow up care. Patient was also instructed to contact our office regarding any change in status that may affect the function of the orthosis. ? ?Patient demonstrated independence with proper donning, doffing, and fit and verbalized understanding of all instructions. ? ?PLAN: ?Patient is to follow up in one week or as necessary (PRN). All questions were answered and concerns addressed. Plan of care was discussed with and agreed upon by the patient. ? ?

## 2021-06-01 NOTE — Telephone Encounter (Signed)
Please advise 

## 2021-06-14 ENCOUNTER — Other Ambulatory Visit: Payer: Self-pay | Admitting: Thoracic Surgery (Cardiothoracic Vascular Surgery)

## 2021-06-14 DIAGNOSIS — Z951 Presence of aortocoronary bypass graft: Secondary | ICD-10-CM

## 2021-06-16 LAB — CBC
Hematocrit: 40.8 % (ref 37.5–51.0)
Hemoglobin: 13.2 g/dL (ref 13.0–17.7)
MCH: 29.7 pg (ref 26.6–33.0)
MCHC: 32.4 g/dL (ref 31.5–35.7)
MCV: 92 fL (ref 79–97)
Platelets: 178 10*3/uL (ref 150–450)
RBC: 4.44 x10E6/uL (ref 4.14–5.80)
RDW: 14 % (ref 11.6–15.4)
WBC: 6.3 10*3/uL (ref 3.4–10.8)

## 2021-06-16 LAB — COMPREHENSIVE METABOLIC PANEL
ALT: 18 IU/L (ref 0–44)
AST: 20 IU/L (ref 0–40)
Albumin/Globulin Ratio: 1.8 (ref 1.2–2.2)
Albumin: 4.6 g/dL (ref 3.8–4.8)
Alkaline Phosphatase: 87 IU/L (ref 44–121)
BUN/Creatinine Ratio: 15 (ref 10–24)
BUN: 18 mg/dL (ref 8–27)
Bilirubin Total: 0.5 mg/dL (ref 0.0–1.2)
CO2: 23 mmol/L (ref 20–29)
Calcium: 9.3 mg/dL (ref 8.6–10.2)
Chloride: 104 mmol/L (ref 96–106)
Creatinine, Ser: 1.17 mg/dL (ref 0.76–1.27)
Globulin, Total: 2.5 g/dL (ref 1.5–4.5)
Glucose: 98 mg/dL (ref 70–99)
Potassium: 4.1 mmol/L (ref 3.5–5.2)
Sodium: 141 mmol/L (ref 134–144)
Total Protein: 7.1 g/dL (ref 6.0–8.5)
eGFR: 67 mL/min/{1.73_m2} (ref >59)

## 2021-06-16 LAB — LIPID PANEL
Chol/HDL Ratio: 3.2 ratio (ref 0.0–5.0)
Cholesterol, Total: 155 mg/dL (ref 100–199)
HDL: 49 mg/dL (ref >39)
LDL Chol Calc (NIH): 83 mg/dL (ref 0–99)
Triglycerides: 128 mg/dL (ref 0–149)
VLDL Cholesterol Cal: 23 mg/dL (ref 5–40)

## 2021-06-19 ENCOUNTER — Ambulatory Visit (INDEPENDENT_AMBULATORY_CARE_PROVIDER_SITE_OTHER): Payer: Self-pay | Admitting: Surgical

## 2021-06-19 ENCOUNTER — Ambulatory Visit
Admission: RE | Admit: 2021-06-19 | Discharge: 2021-06-19 | Disposition: A | Discharge status: Home / Self Care | Payer: Medicare Other | Source: Ambulatory Visit | Attending: Thoracic Surgery (Cardiothoracic Vascular Surgery) | Admitting: Thoracic Surgery (Cardiothoracic Vascular Surgery)

## 2021-06-19 ENCOUNTER — Telehealth (HOSPITAL_BASED_OUTPATIENT_CLINIC_OR_DEPARTMENT_OTHER): Payer: Self-pay

## 2021-06-19 VITALS — BP 129/72 | HR 47 | Resp 20 | Ht 67.0 in | Wt 161.0 lb

## 2021-06-19 DIAGNOSIS — E785 Hyperlipidemia, unspecified: Secondary | ICD-10-CM

## 2021-06-19 DIAGNOSIS — Z951 Presence of aortocoronary bypass graft: Secondary | ICD-10-CM

## 2021-06-19 MED ORDER — EZETIMIBE 10 MG PO TABS: 10.0000 mg | ORAL_TABLET | Freq: Every day | ORAL | 3 refills | Status: DC

## 2021-06-19 NOTE — Progress Notes (Signed)
[address]       [place] [address]             [phone]      Kaulder Conti [facility] Medical Record #[id number] Date of Birth: 07/08/1950  Referring: Buford Dresser,* Primary Care: Bernerd Limbo, MD Primary Cardiologist: Buford Dresser, MD   Chief Complaint:   POST OP FOLLOW UP 05/08/2021 Patient:  Nathaniel Bender Pre-Op Dx: Unstable angina                         Left main/three-vessel coronary artery disease                         Hypertension                         Hyperlipidemia                         Paroxysmal atrial fibrillation Post-op Dx: Same Procedure: CABG X 4.  LIMA to LAD, reverse saphenous vein graft to ramus intermedius, OM 3, PDA Endoscopic greater saphenous vein harvest on the right and left 45 mm atrial clip placement   Surgeon and Role:      * Lightfoot, Lucile Crater, MD - Primary    *D. Tacy Dura, PA-C- assisting History of Present Illness:   Patient is a 71 year old male seen in the office on today's date and routine postsurgical follow-up.  He is status post the above described procedure without any significant postoperative difficulties.  Currently he denies any chest pain, shortness of breath or anginal equivalents.  He is walking between 5000 and 8000 steps a day and sometimes as much as 10,000 on the weekend.  He is requiring no pain medicine at this time.  He has had no fevers, chills or other significant constitutional symptoms.  His incisions are healing well and he has had no drainage or other issues.  There is some firmness to the right thigh endoscopic harvest site.  Overall he is very pleased with his progress.      Past Medical History:  Diagnosis Date   Arthritis    Degenerative Disc Disease Cervical; s/p injection  Ortho.   Atypical mole 05/04/2013   Right Wrist-Severe   Cancer ([place]) 07/21/2013   Skin cancer unknown type   Colon polyps    GERD (gastroesophageal reflux disease)     Gout    Hyperlipidemia    Hypertension    Meniere's disease of left ear    s/p ENT evaluation; acute hearing loss; chronic tinnitus L now.  Recovered hearing completely.   SCCA (squamous cell carcinoma) of skin 08/08/2014   Left Forehead-Well Diff (curet and 5FU)     Social History   Tobacco Use  Smoking Status Never  Smokeless Tobacco Former    Social History   Substance and Sexual Activity  Alcohol Use Yes   Alcohol/week: 20.0 standard drinks   Types: 10 Glasses of wine, 10 Standard drinks or equivalent per week     No Known Allergies  Current Outpatient Medications  Medication Sig Dispense Refill   amLODipine (NORVASC) 5 MG tablet Take 1 tablet (5 mg total) by mouth daily. 30 tablet 0   aspirin EC 325 MG EC tablet Take 1 tablet (325 mg total) by mouth daily.     atorvastatin (LIPITOR) 80 MG tablet Take 1 tablet (  80 mg total) by mouth daily. 90 tablet 3   furosemide (LASIX) 20 MG tablet Take 1 tablet (20 mg total) by mouth as needed. 10 tablet 0   losartan (COZAAR) 50 MG tablet Take 50 mg by mouth daily.     Metoprolol Tartrate 37.5 MG TABS Take 37.5 mg by mouth 2 (two) times daily. 180 tablet 1   omeprazole (PRILOSEC) 20 MG capsule Take 1 capsule (20 mg total) by mouth daily. 90 capsule 0   Turmeric 500 MG TABS Take 500 mg by mouth daily.     No current facility-administered medications for this visit.       Physical Exam: Ht '5\' 7"'$  (1.702 m)   BMI 25.00 kg/m   General appearance: alert, cooperative, and no distress Heart: regular rate and rhythm Lungs: clear to auscultation bilaterally Abdomen: Benign exam Extremities: No edema Wound: Well-healed without evidence of infection   Diagnostic Studies & Laboratory data:     Recent Radiology Findings:   DG Chest 2 View  Result Date: 06/19/2021 CLINICAL DATA:  History of CABG EXAM: CHEST - 2 VIEW COMPARISON:  05/10/2021 FINDINGS: Since the previous exam, there has been interval removal of left chest tube,  mediastinal drain, and right IJ central venous catheter. Postsurgical changes from median sternotomy with CABG and left atrial appendage clipping. Heart size is normal. Lungs are clear. No focal airspace consolidation, pleural effusion, or pneumothorax. IMPRESSION: Interval removal of lines and tubes. No acute cardiopulmonary process. Electronically Signed   By: Davina Poke D.O.   On: 06/19/2021 12:57      Recent Lab Findings: Lab Results  Component Value Date   WBC 6.3 06/15/2021   HGB 13.2 06/15/2021   HCT 40.8 06/15/2021   PLT 178 06/15/2021   GLUCOSE 98 06/15/2021   CHOL 155 06/15/2021   TRIG 128 06/15/2021   HDL 49 06/15/2021   LDLCALC 83 06/15/2021   ALT 18 06/15/2021   AST 20 06/15/2021   NA 141 06/15/2021   K 4.1 06/15/2021   CL 104 06/15/2021   CREATININE 1.17 06/15/2021   BUN 18 06/15/2021   CO2 23 06/15/2021   TSH 2.620 07/30/2019   INR 1.5 (H) 05/08/2021   HGBA1C 6.1 (H) 05/08/2021      Assessment / Plan: Patient is overall doing very well in his post CABG recovery.  There are no specific worrisome issues.  Reviewed his chest x-ray and there are no current concerning cardiopulmonary findings.  The patient will continue to be seen by cardiology and has an appointment in August with Dr. Harrell Gave.  I made no medication changes to his current regimen.  We will see the patient again on a as needed basis for any surgically related needs or at request.      Medication Changes: No orders of the defined types were placed in this encounter.     John Giovanni, PA-C  06/19/2021 1:04 PM

## 2021-06-19 NOTE — Telephone Encounter (Addendum)
Called results to patient and left results on VM (ok per DPR), instructions left to call office back if patient has any questions!       ----- Message from Loel Dubonnet, NP sent at 06/16/2021 11:17 AM EDT ----- Normal kidneys, liver, electrolytes.CBC with no evidence of anemia nor infection.  Lipid panel shows total cholesterol 106, HDL are at goal.  LDL of 83 which is above goal of less than 70.  Recommend addition of Zetia 10 mg daily with FLP/LFT in 2 months.

## 2021-06-19 NOTE — Patient Instructions (Signed)
You may return to driving an automobile as long as you are no longer requiring oral narcotic pain relievers during the daytime.  It would be wise to start driving only short distances during the daylight and gradually increase from there as you feel comfortable. Make every effort to stay physically active, get some type of exercise on a regular basis, and stick to a "heart healthy diet".  The long term benefits for regular exercise and a healthy diet are critically important to your overall health and wellbeing. Make every effort to maintain a "heart-healthy" lifestyle with regular physical exercise and adherence to a low-fat, low-carbohydrate diet.  Continue to seek regular follow-up appointments with your primary care physician and/or cardiologist. You may continue to gradually increase your physical activity as tolerated.  Refrain from any heavy lifting or strenuous use of your arms and shoulders until at least 8 weeks from the time of your surgery, and avoid activities that cause increased pain in your chest on the side of your surgical incision.  Otherwise you may continue to increase activities without any particular limitations.  Increase the intensity and duration of physical activity gradually.

## 2021-07-02 ENCOUNTER — Encounter (HOSPITAL_COMMUNITY)
Admission: RE | Admit: 2021-07-02 | Discharge: 2021-07-02 | Disposition: A | Discharge status: Home / Self Care | Payer: Medicare Other | Source: Ambulatory Visit | Attending: Thoracic Surgery (Cardiothoracic Vascular Surgery) | Admitting: Thoracic Surgery (Cardiothoracic Vascular Surgery)

## 2021-07-02 ENCOUNTER — Encounter (HOSPITAL_COMMUNITY): Payer: Self-pay

## 2021-07-02 VITALS — BP 98/64 | HR 42 | Ht 67.0 in | Wt 167.8 lb

## 2021-07-02 DIAGNOSIS — Z951 Presence of aortocoronary bypass graft: Secondary | ICD-10-CM | POA: Diagnosis present

## 2021-07-02 DIAGNOSIS — I214 Non-ST elevation (NSTEMI) myocardial infarction: Secondary | ICD-10-CM | POA: Insufficient documentation

## 2021-07-02 NOTE — Progress Notes (Signed)
Cardiac Individual Treatment Plan  Patient Details  Name: Nathaniel Bender MRN: [id number] Date of Birth: 1950-08-15 Referring Provider:   Flowsheet Row CARDIAC REHAB PHASE II ORIENTATION from 07/02/2021 in [facility]  Referring Provider Dr. Kipp Brood       Initial Encounter Date:  Flowsheet Row CARDIAC REHAB PHASE II ORIENTATION from 07/02/2021 in [facility]  Date 07/02/21       Visit Diagnosis: S/P CABG x 4  NSTEMI (non-ST elevated myocardial infarction) ([place])  Patient's Home Medications on Admission:  Current Outpatient Medications:    acetaminophen (TYLENOL) 500 MG tablet, Take 1,000 mg by mouth every 6 (six) hours as needed (for pain.)., Disp: , Rfl:    amLODipine (NORVASC) 5 MG tablet, Take 1 tablet (5 mg total) by mouth daily., Disp: 30 tablet, Rfl: 0   aspirin EC 325 MG EC tablet, Take 1 tablet (325 mg total) by mouth daily., Disp: , Rfl:    atorvastatin (LIPITOR) 80 MG tablet, Take 1 tablet (80 mg total) by mouth daily., Disp: 90 tablet, Rfl: 3   ezetimibe (ZETIA) 10 MG tablet, Take 1 tablet (10 mg total) by mouth daily. (Patient taking differently: Take 10 mg by mouth every evening.), Disp: 90 tablet, Rfl: 3   losartan (COZAAR) 50 MG tablet, Take 50 mg by mouth daily., Disp: , Rfl:    Metoprolol Tartrate 37.5 MG TABS, Take 37.5 mg by mouth 2 (two) times daily., Disp: 180 tablet, Rfl: 1   naproxen sodium (ALEVE) 220 MG tablet, Take 220 mg by mouth daily as needed (pain.)., Disp: , Rfl:    omeprazole (PRILOSEC) 20 MG capsule, Take 1 capsule (20 mg total) by mouth daily., Disp: 90 capsule, Rfl: 0   OVER THE COUNTER MEDICATION, Place 1 patch onto the skin at bedtime. Zleep Sleep Patches with Dream Complex (Melatonin, Magnesium, Potassium, 5-HTP, and Zinc), Disp: , Rfl:    furosemide (LASIX) 20 MG tablet, Take 1 tablet (20 mg total) by mouth as needed. (Patient not taking: Reported on 06/26/2021), Disp: 10 tablet, Rfl: 0   Turmeric 500  MG TABS, Take 500 mg by mouth daily., Disp: , Rfl:   Past Medical History: Past Medical History:  Diagnosis Date   Arthritis    Degenerative Disc Disease Cervical; s/p injection [facility].   Atypical mole 05/04/2013   Right Wrist-Severe   Cancer ([place]) 07/21/2013   Skin cancer unknown type   Colon polyps    GERD (gastroesophageal reflux disease)    Gout    Hyperlipidemia    Hypertension    Meniere's disease of left ear    s/p ENT evaluation; acute hearing loss; chronic tinnitus L now.  Recovered hearing completely.   SCCA (squamous cell carcinoma) of skin 08/08/2014   Left Forehead-Well Diff (curet and 5FU)    Tobacco Use: Social History   Tobacco Use  Smoking Status Never  Smokeless Tobacco Former    Labs: Review Flowsheet  More data exists      Latest Ref Rng & Units 07/30/2019 05/06/2021 05/07/2021 05/08/2021  Labs for ITP Cardiac and Pulmonary Rehab  Cholestrol 100 - 199 mg/dL 172  178  - -  LDL (calc) 0 - 99 mg/dL 96  95  - -  HDL-C >39 mg/dL 44  53  - -  Trlycerides 0 - 149 mg/dL 183  148  - -  Hemoglobin A1c 4.8 - 5.6 % 5.9  - - 6.1   PH, Arterial 7.35 - 7.45 - - 7.44  7.355  7.369  7.377  7.405  7.461  7.441  7.372   PCO2 arterial 32 - 48 mmHg - - 35  38.2  37.4  38.3  35.4  34.8  38.5  42.5   Bicarbonate 20.0 - 28.0 mmol/L - - 23.8  21.5  21.7  22.7  22.2  24.8  33.9  26.2  24.6   TCO2 22 - 32 mmol/L - - - '23  23  24  23  23  26  26  26  '$ 35  '27  25  26  29   '$ Acid-base deficit 0.0 - 2.0 mmol/L - - - 4.0  3.0  2.0  2.0  1.0   O2 Saturation % - - 93.2  97  95  97  97  100  77  100  100       06/15/2021  Labs for ITP Cardiac and Pulmonary Rehab  Cholestrol 155   LDL (calc) 83   HDL-C 49   Trlycerides 128   Hemoglobin A1c -  PH, Arterial -  PCO2 arterial -  Bicarbonate -  TCO2 -  Acid-base deficit -  O2 Saturation -    Capillary Blood Glucose: Lab Results  Component Value Date   GLUCAP 121 (H) 05/12/2021   GLUCAP 81 05/12/2021   GLUCAP 164 (H)  05/11/2021   GLUCAP 98 05/11/2021   GLUCAP 171 (H) 05/11/2021     Exercise Target Goals: Exercise Program Goal: Individual exercise prescription set using results from initial 6 min walk test and THRR while considering  patient's activity barriers and safety.   Exercise Prescription Goal: Starting with aerobic activity 30 plus minutes a day, 3 days per week for initial exercise prescription. Provide home exercise prescription and guidelines that participant acknowledges understanding prior to discharge.  Activity Barriers & Risk Stratification:  Activity Barriers & Cardiac Risk Stratification - 07/02/21 1241       Activity Barriers & Cardiac Risk Stratification   Activity Barriers Joint Problems    Cardiac Risk Stratification High             6 Minute Walk:  6 Minute Walk     Row Name 07/02/21 1319         6 Minute Walk   Phase Initial     Distance 1600 feet     Walk Time 6 minutes     # of Rest Breaks 0     MPH 3.03     METS 3.05     RPE 11     VO2 Peak 10.69     Symptoms No     Resting HR 42 bpm     Resting BP 98/64     Resting Oxygen Saturation  97 %     Exercise Oxygen Saturation  during 6 min walk 97 %     Max Ex. HR 54 bpm     Max Ex. BP 122/60     2 Minute Post BP 110/62              Oxygen Initial Assessment:   Oxygen Re-Evaluation:   Oxygen Discharge (Final Oxygen Re-Evaluation):   Initial Exercise Prescription:  Initial Exercise Prescription - 07/02/21 1300       Date of Initial Exercise RX and Referring Provider   Date 07/02/21    Referring Provider Dr. Kipp Brood    Expected Discharge Date 09/24/21      Treadmill   MPH 1.5    Grade 0  Minutes 22      Recumbant Elliptical   Level 1    RPM 60    Minutes 17      Prescription Details   Frequency (times per week) 3    Duration Progress to 30 minutes of continuous aerobic without signs/symptoms of physical distress      Intensity   THRR 40-80% of Max Heartrate 60-121     Ratings of Perceived Exertion 11-13      Resistance Training   Training Prescription Yes    Weight 4    Reps 10-15             Perform Capillary Blood Glucose checks as needed.  Exercise Prescription Changes:   Exercise Comments:   Exercise Goals and Review:   Exercise Goals     Row Name 07/02/21 1321             Exercise Goals   Increase Physical Activity Yes       Intervention Provide advice, education, support and counseling about physical activity/exercise needs.;Develop an individualized exercise prescription for aerobic and resistive training based on initial evaluation findings, risk stratification, comorbidities and participant's personal goals.       Expected Outcomes Short Term: Attend rehab on a regular basis to increase amount of physical activity.;Long Term: Add in home exercise to make exercise part of routine and to increase amount of physical activity.;Long Term: Exercising regularly at least 3-5 days a week.       Increase Strength and Stamina Yes       Intervention Provide advice, education, support and counseling about physical activity/exercise needs.;Develop an individualized exercise prescription for aerobic and resistive training based on initial evaluation findings, risk stratification, comorbidities and participant's personal goals.       Expected Outcomes Short Term: Increase workloads from initial exercise prescription for resistance, speed, and METs.;Short Term: Perform resistance training exercises routinely during rehab and add in resistance training at home;Long Term: Improve cardiorespiratory fitness, muscular endurance and strength as measured by increased METs and functional capacity (6MWT)       Able to understand and use rate of perceived exertion (RPE) scale Yes       Intervention Provide education and explanation on how to use RPE scale       Expected Outcomes Short Term: Able to use RPE daily in rehab to express subjective intensity  level;Long Term:  Able to use RPE to guide intensity level when exercising independently       Knowledge and understanding of Target Heart Rate Range (THRR) Yes       Intervention Provide education and explanation of THRR including how the numbers were predicted and where they are located for reference       Expected Outcomes Short Term: Able to state/look up THRR;Long Term: Able to use THRR to govern intensity when exercising independently;Short Term: Able to use daily as guideline for intensity in rehab       Able to check pulse independently Yes       Intervention Provide education and demonstration on how to check pulse in carotid and radial arteries.;Review the importance of being able to check your own pulse for safety during independent exercise       Expected Outcomes Short Term: Able to explain why pulse checking is important during independent exercise;Long Term: Able to check pulse independently and accurately       Understanding of Exercise Prescription Yes       Intervention Provide education, explanation,  and written materials on patient's individual exercise prescription       Expected Outcomes Short Term: Able to explain program exercise prescription;Long Term: Able to explain home exercise prescription to exercise independently                Exercise Goals Re-Evaluation :    Discharge Exercise Prescription (Final Exercise Prescription Changes):   Nutrition:  Target Goals: Understanding of nutrition guidelines, daily intake of sodium '1500mg'$ , cholesterol '200mg'$ , calories 30% from fat and 7% or less from saturated fats, daily to have 5 or more servings of fruits and vegetables.  Biometrics:  Pre Biometrics - 07/02/21 1322       Pre Biometrics   Height '5\' 7"'$  (1.702 m)    Weight 167 lb 12.3 oz (76.1 kg)    Waist Circumference 34 inches    Hip Circumference 36 inches    Waist to Hip Ratio 0.94 %    BMI (Calculated) 26.27    Triceps Skinfold 10 mm    % Body Fat 22.7  %    Grip Strength 27.7 kg    Flexibility 9 in    Single Leg Stand 60 seconds              Nutrition Therapy Plan and Nutrition Goals:  Nutrition Therapy & Goals - 07/02/21 1251       Intervention Plan   Intervention Nutrition handout(s) given to patient.    Expected Outcomes Short Term Goal: Understand basic principles of dietary content, such as calories, fat, sodium, cholesterol and nutrients.             Nutrition Assessments:  Nutrition Assessments - 07/02/21 1251       MEDFICTS Scores   Pre Score 55            MEDIFICTS Score Key: ?70 Need to make dietary changes  40-70 Heart Healthy Diet ? 40 Therapeutic Level Cholesterol Diet   Picture Your Plate Scores: <01 Unhealthy dietary pattern with much room for improvement. 41-50 Dietary pattern unlikely to meet recommendations for good health and room for improvement. 51-60 More healthful dietary pattern, with some room for improvement.  >60 Healthy dietary pattern, although there may be some specific behaviors that could be improved.    Nutrition Goals Re-Evaluation:   Nutrition Goals Discharge (Final Nutrition Goals Re-Evaluation):   Psychosocial: Target Goals: Acknowledge presence or absence of significant depression and/or stress, maximize coping skills, provide positive support system. Participant is able to verbalize types and ability to use techniques and skills needed for reducing stress and depression.  Initial Review & Psychosocial Screening:  Initial Psych Review & Screening - 07/02/21 1311       Initial Review   Current issues with Current Sleep Concerns      Family Dynamics   Good Support System? Yes    Comments His support system is his wife. His mother and his friends also support him.      Barriers   Psychosocial barriers to participate in program There are no identifiable barriers or psychosocial needs.      Screening Interventions   Interventions Encouraged to exercise     Expected Outcomes Long Term goal: The participant improves quality of Life and PHQ9 Scores as seen by post scores and/or verbalization of changes;Short Term goal: Identification and review with participant of any Quality of Life or Depression concerns found by scoring the questionnaire.             Quality of Life Scores:  Quality of Life - 07/02/21 1323       Quality of Life   Select Quality of Life      Quality of Life Scores   Health/Function Pre 24.6 %    Socioeconomic Pre 25.93 %    Psych/Spiritual Pre 28 %    Family Pre 25.5 %    GLOBAL Pre 25.64 %            Scores of 19 and below usually indicate a poorer quality of life in these areas.  A difference of  2-3 points is a clinically meaningful difference.  A difference of 2-3 points in the total score of the Quality of Life Index has been associated with significant improvement in overall quality of life, self-image, physical symptoms, and general health in studies assessing change in quality of life.  PHQ-9: Review Flowsheet  More data exists      07/02/2021 08/06/2019 02/05/2019 07/03/2018 01/01/2018  Depression screen PHQ 2/9  Decreased Interest 0 0 0 0 0  Down, Depressed, Hopeless 0 0 0 0 0  PHQ - 2 Score 0 0 0 0 0  Altered sleeping 1 - - - -  Tired, decreased energy 1 - - - -  Change in appetite 1 - - - -  Feeling bad or failure about yourself  0 - - - -  Trouble concentrating 1 - - - -  Moving slowly or fidgety/restless 0 - - - -  Suicidal thoughts 0 - - - -  PHQ-9 Score 4 - - - -  Difficult doing work/chores Not difficult at all - - - -   Interpretation of Total Score  Total Score Depression Severity:  1-4 = Minimal depression, 5-9 = Mild depression, 10-14 = Moderate depression, 15-19 = Moderately severe depression, 20-27 = Severe depression   Psychosocial Evaluation and Intervention:  Psychosocial Evaluation - 07/02/21 1332       Psychosocial Evaluation & Interventions   Interventions Encouraged to  exercise with the program and follow exercise prescription;Relaxation education;Stress management education    Comments Pt has no barriers to participating in CR. He has no identifiable psychosocial issues. He does report problems falling asleep, and he has had this problem for some time. He has been wearing melatonin patches for a few weeks now and reports that this has helped some. He scored a 4 on his PHQ-9 and relates this to his recovery from bypass surgery. He reports having a good support system with his wife, his mother, and his friends. He lives a very active lifestyle, as he rides motorcycles and has a horse and two donkeys which he tends to. 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 He reports that his goals while in the program are to improve his strength and stamina. He is eager to start the program.    Expected Outcomes Pt will continue to have no identifiable psychosocial issues.    Continue Psychosocial Services  No Follow up required             Psychosocial Re-Evaluation:   Psychosocial Discharge (Final Psychosocial Re-Evaluation):   Vocational Rehabilitation: Provide vocational rehab assistance to qualifying candidates.   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             Education: Education Goals: Education classes will be provided on a weekly basis, covering required topics. Participant will state understanding/return demonstration of topics presented.  Learning Barriers/Preferences:  Learning Barriers/Preferences - 07/02/21 1313       Learning Barriers/Preferences   Learning Barriers None    Learning Preferences Skilled  Demonstration;Audio;Computer/Internet;Group Instruction;Individual Instruction;Pictoral;Verbal Instruction;Video;Written Material             Education Topics: Hypertension, Hypertension Reduction -Define heart disease and high blood pressure. Discus how high blood pressure affects the body and ways to reduce high blood pressure.   Exercise and Your Heart -Discuss why it is important to exercise, the FITT principles of exercise, normal and abnormal responses to exercise, and how to exercise safely.   Angina -Discuss definition of angina, causes of angina, treatment of angina, and how to decrease risk of having angina.   Cardiac Medications -Review what the following cardiac medications are used for, how they affect the body, and side effects that may occur when taking the medications.  Medications include Aspirin, Beta blockers, calcium channel blockers, ACE Inhibitors, angiotensin receptor blockers, diuretics, digoxin, and antihyperlipidemics.   Congestive Heart Failure -Discuss the definition of CHF, how to live with CHF, the signs and symptoms of CHF, and how keep track of weight and sodium intake.   Heart Disease and Intimacy -Discus the effect sexual activity has on the heart, how changes occur during intimacy as we age, and safety during sexual activity.   Smoking Cessation / COPD -Discuss different methods to quit smoking, the health benefits of quitting smoking, and the definition of COPD.   Nutrition I: Fats -Discuss the types of cholesterol, what cholesterol does to the heart, and how cholesterol levels can be controlled.   Nutrition II: Labels -Discuss the different components of food labels and how to read food label   Heart Parts/Heart Disease and PAD -Discuss the anatomy of the heart, the pathway of blood circulation through the heart, and these are affected by heart disease.   Stress I: Signs and Symptoms -Discuss the causes of stress, how stress may  lead to anxiety and depression, and ways to limit stress.   Stress II: Relaxation -Discuss different types of relaxation techniques to limit stress.   Warning Signs of Stroke / TIA -Discuss definition of a stroke, what the signs and symptoms are of a stroke, and how to identify when someone is having stroke.   Knowledge Questionnaire Score:  Knowledge Questionnaire Score - 07/02/21 1314       Knowledge Questionnaire Score   Pre Score 22/24             Core Components/Risk Factors/Patient Goals at Admission:  Personal Goals and Risk Factors at Admission - 07/02/21 1320       Core Components/Risk Factors/Patient Goals on Admission   Hypertension Yes    Intervention Provide education on lifestyle modifcations including regular physical activity/exercise, weight management, moderate sodium restriction and increased consumption of fresh fruit, vegetables, and low fat dairy, alcohol moderation, and smoking cessation.;Monitor prescription use compliance.    Expected Outcomes Short Term: Continued assessment and intervention until BP is < 140/65m HG in hypertensive participants. < 130/858mHG in hypertensive participants with diabetes, heart failure or chronic kidney disease.;Long Term: Maintenance of blood pressure at goal levels.    Lipids Yes    Intervention Provide education and support for  participant on nutrition & aerobic/resistive exercise along with prescribed medications to achieve LDL '70mg'$ , HDL >'40mg'$ .    Expected Outcomes Short Term: Participant states understanding of desired cholesterol values and is compliant with medications prescribed. Participant is following exercise prescription and nutrition guidelines.;Long Term: Cholesterol controlled with medications as prescribed, with individualized exercise RX and with personalized nutrition plan. Value goals: LDL < '70mg'$ , HDL > 40 mg.    Personal Goal Other Yes    Personal Goal Improve strength and stamina.    Intervention  Attend CR three days per week and continue with his home exercise program.    Expected Outcomes Pt will meet his personal goals.             Core Components/Risk Factors/Patient Goals Review:    Core Components/Risk Factors/Patient Goals at Discharge (Final Review):    ITP Comments:   Comments: Patient arrived for 1st visit/orientation/education at 1230. Patient was referred to CR by Dr. Kipp Brood due to S/P CABG x4 (Z95.1) and NSTEMI (I21.4). During orientation advised patient on arrival and appointment times what to wear, what to do before, during and after exercise. Reviewed attendance and class policy.  Pt is scheduled to return Cardiac Rehab on 07/04/2021 at 1100. Pt was advised to come to class 15 minutes before class starts.  Discussed RPE/Dpysnea scales. Patient participated in warm up stretches. Patient was able to complete 6 minute walk test.  Telemetry: sinus bradycardia. Patient was measured for the equipment. Discussed equipment safety with patient. Took patient pre-anthropometric measurements. Patient finished visit at 1330.

## 2021-07-04 ENCOUNTER — Encounter (HOSPITAL_COMMUNITY)
Admission: RE | Admit: 2021-07-04 | Discharge: 2021-07-04 | Disposition: A | Discharge status: Home / Self Care | Payer: Medicare Other | Source: Ambulatory Visit | Attending: Thoracic Surgery (Cardiothoracic Vascular Surgery) | Admitting: Thoracic Surgery (Cardiothoracic Vascular Surgery)

## 2021-07-04 DIAGNOSIS — I214 Non-ST elevation (NSTEMI) myocardial infarction: Secondary | ICD-10-CM

## 2021-07-04 DIAGNOSIS — Z951 Presence of aortocoronary bypass graft: Secondary | ICD-10-CM

## 2021-07-04 NOTE — Progress Notes (Signed)
Daily Session Note  Patient Details  Name: Nathaniel Bender MRN: [id number] Date of Birth: 1950-05-29 Referring Provider:   Flowsheet Row CARDIAC REHAB PHASE II ORIENTATION from 07/02/2021 in [facility]  Referring Provider Dr. Kipp Brood       Encounter Date: 07/04/2021  Check In:  Session Check In - 07/04/21 1100       Check-In   Supervising physician immediately available to respond to emergencies [facility] MD immediately available    Physician(s) Dr. Radford Pax    Location [facility]    Staff Present Hoy Register, MS, ACSM-CEP, Exercise Physiologist;Eryanna Regal Zigmund Daniel, Exercise Physiologist;Daphyne Hassell Done, RN, BSN    Virtual Visit No    Medication changes reported     No    Fall or balance concerns reported    No    Tobacco Cessation No Change    Warm-up and Cool-down Performed as group-led instruction    Resistance Training Performed Yes    VAD Patient? No    PAD/SET Patient? No      Pain Assessment   Currently in Pain? No/denies    Multiple Pain Sites No             Capillary Blood Glucose: No results found for this or any previous visit (from the past 24 hour(s)).    Social History   Tobacco Use  Smoking Status Never  Smokeless Tobacco Former    Goals Met:  Independence with exercise equipment Exercise tolerated well No report of concerns or symptoms today Strength training completed today  Goals Unmet:  Not app  Comments: check out 1200   Dr. Carlyle Dolly is Medical Director for [facility]

## 2021-07-06 ENCOUNTER — Encounter (HOSPITAL_COMMUNITY): Payer: Medicare Other

## 2021-07-07 ENCOUNTER — Encounter (HOSPITAL_BASED_OUTPATIENT_CLINIC_OR_DEPARTMENT_OTHER): Payer: Self-pay

## 2021-07-09 ENCOUNTER — Encounter (HOSPITAL_COMMUNITY)
Admission: RE | Admit: 2021-07-09 | Discharge: 2021-07-09 | Disposition: A | Discharge status: Home / Self Care | Payer: Medicare Other | Source: Ambulatory Visit | Attending: Thoracic Surgery (Cardiothoracic Vascular Surgery) | Admitting: Thoracic Surgery (Cardiothoracic Vascular Surgery)

## 2021-07-09 VITALS — Wt 165.3 lb

## 2021-07-09 DIAGNOSIS — Z951 Presence of aortocoronary bypass graft: Secondary | ICD-10-CM | POA: Diagnosis not present

## 2021-07-09 DIAGNOSIS — I214 Non-ST elevation (NSTEMI) myocardial infarction: Secondary | ICD-10-CM

## 2021-07-09 NOTE — Progress Notes (Signed)
Daily Session Note  Patient Details  Name: Nathaniel Bender MRN: [id number] Date of Birth: 1950-12-01 Referring Provider:   Flowsheet Row CARDIAC REHAB PHASE II ORIENTATION from 07/02/2021 in [facility]  Referring Provider Dr. Kipp Brood       Encounter Date: 07/09/2021  Check In:  Session Check In - 07/09/21 1100       Check-In   Supervising physician immediately available to respond to emergencies [facility] MD immediately available    Physician(s) Dr Kristine Garbe    Location [facility]    Staff Present Redge Gainer, BS, Exercise Physiologist;Kellyn Mansfield Hassell Done, RN, Jennye Moccasin, RN, BSN    Virtual Visit No    Medication changes reported     No    Fall or balance concerns reported    No    Tobacco Cessation No Change    Warm-up and Cool-down Performed as group-led instruction    Resistance Training Performed Yes    VAD Patient? No    PAD/SET Patient? No      Pain Assessment   Currently in Pain? No/denies    Multiple Pain Sites No             Capillary Blood Glucose: No results found for this or any previous visit (from the past 24 hour(s)).    Social History   Tobacco Use  Smoking Status Never  Smokeless Tobacco Former    Goals Met:  Independence with exercise equipment Exercise tolerated well No report of concerns or symptoms today Strength training completed today  Goals Unmet:  Not Applicable  Comments: Checkout at 1200.   Dr. Carlyle Dolly is Medical Director for [facility]

## 2021-07-11 ENCOUNTER — Encounter (HOSPITAL_COMMUNITY)
Admission: RE | Admit: 2021-07-11 | Discharge: 2021-07-11 | Disposition: A | Discharge status: Home / Self Care | Payer: Medicare Other | Source: Ambulatory Visit | Attending: Thoracic Surgery (Cardiothoracic Vascular Surgery) | Admitting: Thoracic Surgery (Cardiothoracic Vascular Surgery)

## 2021-07-11 DIAGNOSIS — Z951 Presence of aortocoronary bypass graft: Secondary | ICD-10-CM

## 2021-07-11 DIAGNOSIS — I214 Non-ST elevation (NSTEMI) myocardial infarction: Secondary | ICD-10-CM

## 2021-07-11 NOTE — Progress Notes (Signed)
Cardiac Individual Treatment Plan  Patient Details  Name: Nathaniel Bender MRN: [id number] Date of Birth: 1950-08-30 Referring Provider:   Flowsheet Row CARDIAC REHAB PHASE II ORIENTATION from 07/02/2021 in [facility]  Referring Provider Dr. Kipp Brood       Initial Encounter Date:  Flowsheet Row CARDIAC REHAB PHASE II ORIENTATION from 07/02/2021 in [facility]  Date 07/02/21       Visit Diagnosis: S/P CABG x 4  NSTEMI (non-ST elevated myocardial infarction) ([place])  Patient's Home Medications on Admission:  Current Outpatient Medications:    acetaminophen (TYLENOL) 500 MG tablet, Take 1,000 mg by mouth every 6 (six) hours as needed (for pain.)., Disp: , Rfl:    amLODipine (NORVASC) 5 MG tablet, Take 1 tablet (5 mg total) by mouth daily., Disp: 30 tablet, Rfl: 0   aspirin EC 325 MG EC tablet, Take 1 tablet (325 mg total) by mouth daily., Disp: , Rfl:    atorvastatin (LIPITOR) 80 MG tablet, Take 1 tablet (80 mg total) by mouth daily., Disp: 90 tablet, Rfl: 3   ezetimibe (ZETIA) 10 MG tablet, Take 1 tablet (10 mg total) by mouth daily. (Patient taking differently: Take 10 mg by mouth every evening.), Disp: 90 tablet, Rfl: 3   furosemide (LASIX) 20 MG tablet, Take 1 tablet (20 mg total) by mouth as needed. (Patient not taking: Reported on 06/26/2021), Disp: 10 tablet, Rfl: 0   losartan (COZAAR) 50 MG tablet, Take 50 mg by mouth daily., Disp: , Rfl:    Metoprolol Tartrate 37.5 MG TABS, Take 37.5 mg by mouth 2 (two) times daily., Disp: 180 tablet, Rfl: 1   naproxen sodium (ALEVE) 220 MG tablet, Take 220 mg by mouth daily as needed (pain.)., Disp: , Rfl:    omeprazole (PRILOSEC) 20 MG capsule, Take 1 capsule (20 mg total) by mouth daily., Disp: 90 capsule, Rfl: 0   OVER THE COUNTER MEDICATION, Place 1 patch onto the skin at bedtime. Zleep Sleep Patches with Dream Complex (Melatonin, Magnesium, Potassium, 5-HTP, and Zinc), Disp: , Rfl:    Turmeric 500  MG TABS, Take 500 mg by mouth daily., Disp: , Rfl:   Past Medical History: Past Medical History:  Diagnosis Date   Arthritis    Degenerative Disc Disease Cervical; s/p injection [facility].   Atypical mole 05/04/2013   Right Wrist-Severe   Cancer ([place]) 07/21/2013   Skin cancer unknown type   Colon polyps    GERD (gastroesophageal reflux disease)    Gout    Hyperlipidemia    Hypertension    Meniere's disease of left ear    s/p ENT evaluation; acute hearing loss; chronic tinnitus L now.  Recovered hearing completely.   SCCA (squamous cell carcinoma) of skin 08/08/2014   Left Forehead-Well Diff (curet and 5FU)    Tobacco Use: Social History   Tobacco Use  Smoking Status Never  Smokeless Tobacco Former    Labs: Review Flowsheet  More data exists      Latest Ref Rng & Units 07/30/2019 05/06/2021 05/07/2021 05/08/2021  Labs for ITP Cardiac and Pulmonary Rehab  Cholestrol 100 - 199 mg/dL 172  178  - -  LDL (calc) 0 - 99 mg/dL 96  95  - -  HDL-C >39 mg/dL 44  53  - -  Trlycerides 0 - 149 mg/dL 183  148  - -  Hemoglobin A1c 4.8 - 5.6 % 5.9  - - 6.1   PH, Arterial 7.35 - 7.45 - - 7.44  7.355  7.369  7.377  7.405  7.461  7.441  7.372   PCO2 arterial 32 - 48 mmHg - - 35  38.2  37.4  38.3  35.4  34.8  38.5  42.5   Bicarbonate 20.0 - 28.0 mmol/L - - 23.8  21.5  21.7  22.7  22.2  24.8  33.9  26.2  24.6   TCO2 22 - 32 mmol/L - - - '23  23  24  23  23  26  26  26  '$ 35  '27  25  26  29   '$ Acid-base deficit 0.0 - 2.0 mmol/L - - - 4.0  3.0  2.0  2.0  1.0   O2 Saturation % - - 93.2  97  95  97  97  100  77  100  100       06/15/2021  Labs for ITP Cardiac and Pulmonary Rehab  Cholestrol 155   LDL (calc) 83   HDL-C 49   Trlycerides 128   Hemoglobin A1c -  PH, Arterial -  PCO2 arterial -  Bicarbonate -  TCO2 -  Acid-base deficit -  O2 Saturation -    Capillary Blood Glucose: Lab Results  Component Value Date   GLUCAP 121 (H) 05/12/2021   GLUCAP 81 05/12/2021   GLUCAP 164 (H)  05/11/2021   GLUCAP 98 05/11/2021   GLUCAP 171 (H) 05/11/2021     Exercise Target Goals: Exercise Program Goal: Individual exercise prescription set using results from initial 6 min walk test and THRR while considering  patient's activity barriers and safety.   Exercise Prescription Goal: Starting with aerobic activity 30 plus minutes a day, 3 days per week for initial exercise prescription. Provide home exercise prescription and guidelines that participant acknowledges understanding prior to discharge.  Activity Barriers & Risk Stratification:  Activity Barriers & Cardiac Risk Stratification - 07/02/21 1241       Activity Barriers & Cardiac Risk Stratification   Activity Barriers Joint Problems    Cardiac Risk Stratification High             6 Minute Walk:  6 Minute Walk     Row Name 07/02/21 1319         6 Minute Walk   Phase Initial     Distance 1600 feet     Walk Time 6 minutes     # of Rest Breaks 0     MPH 3.03     METS 3.05     RPE 11     VO2 Peak 10.69     Symptoms No     Resting HR 42 bpm     Resting BP 98/64     Resting Oxygen Saturation  97 %     Exercise Oxygen Saturation  during 6 min walk 97 %     Max Ex. HR 54 bpm     Max Ex. BP 122/60     2 Minute Post BP 110/62              Oxygen Initial Assessment:   Oxygen Re-Evaluation:   Oxygen Discharge (Final Oxygen Re-Evaluation):   Initial Exercise Prescription:  Initial Exercise Prescription - 07/02/21 1300       Date of Initial Exercise RX and Referring Provider   Date 07/02/21    Referring Provider Dr. Kipp Brood    Expected Discharge Date 09/24/21      Treadmill   MPH 1.5    Grade 0  Minutes 22      Recumbant Elliptical   Level 1    RPM 60    Minutes 17      Prescription Details   Frequency (times per week) 3    Duration Progress to 30 minutes of continuous aerobic without signs/symptoms of physical distress      Intensity   THRR 40-80% of Max Heartrate 60-121     Ratings of Perceived Exertion 11-13      Resistance Training   Training Prescription Yes    Weight 4    Reps 10-15             Perform Capillary Blood Glucose checks as needed.  Exercise Prescription Changes:   Exercise Prescription Changes     Row Name 07/09/21 1400             Response to Exercise   Blood Pressure (Admit) 128/66       Blood Pressure (Exercise) 138/60       Blood Pressure (Exit) 110/66       Heart Rate (Admit) 45 bpm       Heart Rate (Exercise) 71 bpm       Heart Rate (Exit) 53 bpm       Rating of Perceived Exertion (Exercise) 12       Duration Continue with 30 min of aerobic exercise without signs/symptoms of physical distress.       Intensity THRR unchanged         Progression   Progression Continue to progress workloads to maintain intensity without signs/symptoms of physical distress.         Resistance Training   Training Prescription Yes       Weight 5       Reps 10-15       Time 10 Minutes         Treadmill   MPH 2.6       Grade 0       Minutes 17       METs 2.99         Recumbant Elliptical   Level 1       RPM 61       Minutes 22       METs 4.8                Exercise Comments:   Exercise Goals and Review:   Exercise Goals     Row Name 07/02/21 1321 07/09/21 1436           Exercise Goals   Increase Physical Activity Yes Yes      Intervention Provide advice, education, support and counseling about physical activity/exercise needs.;Develop an individualized exercise prescription for aerobic and resistive training based on initial evaluation findings, risk stratification, comorbidities and participant's personal goals. Provide advice, education, support and counseling about physical activity/exercise needs.;Develop an individualized exercise prescription for aerobic and resistive training based on initial evaluation findings, risk stratification, comorbidities and participant's personal goals.      Expected  Outcomes Short Term: Attend rehab on a regular basis to increase amount of physical activity.;Long Term: Add in home exercise to make exercise part of routine and to increase amount of physical activity.;Long Term: Exercising regularly at least 3-5 days a week. Short Term: Attend rehab on a regular basis to increase amount of physical activity.;Long Term: Add in home exercise to make exercise part of routine and to increase amount of physical activity.;Long Term: Exercising regularly at least 3-5 days a  week.      Increase Strength and Stamina Yes Yes      Intervention Provide advice, education, support and counseling about physical activity/exercise needs.;Develop an individualized exercise prescription for aerobic and resistive training based on initial evaluation findings, risk stratification, comorbidities and participant's personal goals. Provide advice, education, support and counseling about physical activity/exercise needs.;Develop an individualized exercise prescription for aerobic and resistive training based on initial evaluation findings, risk stratification, comorbidities and participant's personal goals.      Expected Outcomes Short Term: Increase workloads from initial exercise prescription for resistance, speed, and METs.;Short Term: Perform resistance training exercises routinely during rehab and add in resistance training at home;Long Term: Improve cardiorespiratory fitness, muscular endurance and strength as measured by increased METs and functional capacity (6MWT) Short Term: Increase workloads from initial exercise prescription for resistance, speed, and METs.;Short Term: Perform resistance training exercises routinely during rehab and add in resistance training at home;Long Term: Improve cardiorespiratory fitness, muscular endurance and strength as measured by increased METs and functional capacity (6MWT)      Able to understand and use rate of perceived exertion (RPE) scale Yes Yes       Intervention Provide education and explanation on how to use RPE scale Provide education and explanation on how to use RPE scale      Expected Outcomes Short Term: Able to use RPE daily in rehab to express subjective intensity level;Long Term:  Able to use RPE to guide intensity level when exercising independently Short Term: Able to use RPE daily in rehab to express subjective intensity level;Long Term:  Able to use RPE to guide intensity level when exercising independently      Knowledge and understanding of Target Heart Rate Range (THRR) Yes Yes      Intervention Provide education and explanation of THRR including how the numbers were predicted and where they are located for reference Provide education and explanation of THRR including how the numbers were predicted and where they are located for reference      Expected Outcomes Short Term: Able to state/look up THRR;Long Term: Able to use THRR to govern intensity when exercising independently;Short Term: Able to use daily as guideline for intensity in rehab Short Term: Able to state/look up THRR;Long Term: Able to use THRR to govern intensity when exercising independently;Short Term: Able to use daily as guideline for intensity in rehab      Able to check pulse independently Yes Yes      Intervention Provide education and demonstration on how to check pulse in carotid and radial arteries.;Review the importance of being able to check your own pulse for safety during independent exercise Provide education and demonstration on how to check pulse in carotid and radial arteries.;Review the importance of being able to check your own pulse for safety during independent exercise      Expected Outcomes Short Term: Able to explain why pulse checking is important during independent exercise;Long Term: Able to check pulse independently and accurately Short Term: Able to explain why pulse checking is important during independent exercise;Long Term: Able to check pulse  independently and accurately      Understanding of Exercise Prescription Yes Yes      Intervention Provide education, explanation, and written materials on patient's individual exercise prescription Provide education, explanation, and written materials on patient's individual exercise prescription      Expected Outcomes Short Term: Able to explain program exercise prescription;Long Term: Able to explain home exercise prescription to exercise independently Short Term:  Able to explain program exercise prescription;Long Term: Able to explain home exercise prescription to exercise independently               Exercise Goals Re-Evaluation :  Exercise Goals Re-Evaluation     [place] Name 07/09/21 1437             Exercise Goal Re-Evaluation   Exercise Goals Review Increase Physical Activity;Increase Strength and Stamina;Able to understand and use rate of perceived exertion (RPE) scale;Knowledge and understanding of Target Heart Rate Range (THRR);Able to check pulse independently;Understanding of Exercise Prescription       Comments Pt has completed 3 sessions of caridac rehab. He is very motivated to be in class and is eagar to increase his workload. He is currently exercising at 4.8 METs on the ellp. Will continue to monitor and progress as able.       Expected Outcomes Through exercise at home and at rehab, the patient will meet their stated goals.                 Discharge Exercise Prescription (Final Exercise Prescription Changes):  Exercise Prescription Changes - 07/09/21 1400       Response to Exercise   Blood Pressure (Admit) 128/66    Blood Pressure (Exercise) 138/60    Blood Pressure (Exit) 110/66    Heart Rate (Admit) 45 bpm    Heart Rate (Exercise) 71 bpm    Heart Rate (Exit) 53 bpm    Rating of Perceived Exertion (Exercise) 12    Duration Continue with 30 min of aerobic exercise without signs/symptoms of physical distress.    Intensity THRR unchanged      Progression    Progression Continue to progress workloads to maintain intensity without signs/symptoms of physical distress.      Resistance Training   Training Prescription Yes    Weight 5    Reps 10-15    Time 10 Minutes      Treadmill   MPH 2.6    Grade 0    Minutes 17    METs 2.99      Recumbant Elliptical   Level 1    RPM 61    Minutes 22    METs 4.8             Nutrition:  Target Goals: Understanding of nutrition guidelines, daily intake of sodium '1500mg'$ , cholesterol '200mg'$ , calories 30% from fat and 7% or less from saturated fats, daily to have 5 or more servings of fruits and vegetables.  Biometrics:  Pre Biometrics - 07/02/21 1322       Pre Biometrics   Height '5\' 7"'$  (1.702 m)    Weight 76.1 kg    Waist Circumference 34 inches    Hip Circumference 36 inches    Waist to Hip Ratio 0.94 %    BMI (Calculated) 26.27    Triceps Skinfold 10 mm    % Body Fat 22.7 %    Grip Strength 27.7 kg    Flexibility 9 in    Single Leg Stand 60 seconds              Nutrition Therapy Plan and Nutrition Goals:  Nutrition Therapy & Goals - 07/02/21 1251       Intervention Plan   Intervention Nutrition handout(s) given to patient.    Expected Outcomes Short Term Goal: Understand basic principles of dietary content, such as calories, fat, sodium, cholesterol and nutrients.  Nutrition Assessments:  Nutrition Assessments - 07/02/21 1251       MEDFICTS Scores   Pre Score 55            MEDIFICTS Score Key: ?70 Need to make dietary changes  40-70 Heart Healthy Diet ? 40 Therapeutic Level Cholesterol Diet   Picture Your Plate Scores: <55 Unhealthy dietary pattern with much room for improvement. 41-50 Dietary pattern unlikely to meet recommendations for good health and room for improvement. 51-60 More healthful dietary pattern, with some room for improvement.  >60 Healthy dietary pattern, although there may be some specific behaviors that could be  improved.    Nutrition Goals Re-Evaluation:   Nutrition Goals Discharge (Final Nutrition Goals Re-Evaluation):   Psychosocial: Target Goals: Acknowledge presence or absence of significant depression and/or stress, maximize coping skills, provide positive support system. Participant is able to verbalize types and ability to use techniques and skills needed for reducing stress and depression.  Initial Review & Psychosocial Screening:  Initial Psych Review & Screening - 07/02/21 1311       Initial Review   Current issues with Current Sleep Concerns      Family Dynamics   Good Support System? Yes    Comments His support system is his wife. His mother and his friends also support him.      Barriers   Psychosocial barriers to participate in program There are no identifiable barriers or psychosocial needs.      Screening Interventions   Interventions Encouraged to exercise    Expected Outcomes Long Term goal: The participant improves quality of Life and PHQ9 Scores as seen by post scores and/or verbalization of changes;Short Term goal: Identification and review with participant of any Quality of Life or Depression concerns found by scoring the questionnaire.             Quality of Life Scores:  Quality of Life - 07/02/21 1323       Quality of Life   Select Quality of Life      Quality of Life Scores   Health/Function Pre 24.6 %    Socioeconomic Pre 25.93 %    Psych/Spiritual Pre 28 %    Family Pre 25.5 %    GLOBAL Pre 25.64 %            Scores of 19 and below usually indicate a poorer quality of life in these areas.  A difference of  2-3 points is a clinically meaningful difference.  A difference of 2-3 points in the total score of the Quality of Life Index has been associated with significant improvement in overall quality of life, self-image, physical symptoms, and general health in studies assessing change in quality of life.  PHQ-9: Review Flowsheet  More data  exists      07/02/2021 08/06/2019 02/05/2019 07/03/2018 01/01/2018  Depression screen PHQ 2/9  Decreased Interest 0 0 0 0 0  Down, Depressed, Hopeless 0 0 0 0 0  PHQ - 2 Score 0 0 0 0 0  Altered sleeping 1 - - - -  Tired, decreased energy 1 - - - -  Change in appetite 1 - - - -  Feeling bad or failure about yourself  0 - - - -  Trouble concentrating 1 - - - -  Moving slowly or fidgety/restless 0 - - - -  Suicidal thoughts 0 - - - -  PHQ-9 Score 4 - - - -  Difficult doing work/chores Not difficult at all - - - -  Interpretation of Total Score  Total Score Depression Severity:  1-4 = Minimal depression, 5-9 = Mild depression, 10-14 = Moderate depression, 15-19 = Moderately severe depression, 20-27 = Severe depression   Psychosocial Evaluation and Intervention:  Psychosocial Evaluation - 07/02/21 1332       Psychosocial Evaluation & Interventions   Interventions Encouraged to exercise with the program and follow exercise prescription;Relaxation education;Stress management education    Comments Pt has no barriers to participating in CR. He has no identifiable psychosocial issues. He does report problems falling asleep, and he has had this problem for some time. He has been wearing melatonin patches for a few weeks now and reports that this has helped some. He scored a 4 on his PHQ-9 and relates this to his recovery from bypass surgery. He reports having a good support system with his wife, his mother, and his friends. He lives a very active lifestyle, as he rides motorcycles and has a horse and two donkeys which he tends to. 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 He reports that his goals while in the program are to improve his strength and stamina. He is eager to start the program.    Expected Outcomes Pt will continue to have no identifiable psychosocial issues.    Continue Psychosocial Services  No Follow up required              Psychosocial Re-Evaluation:  Psychosocial Re-Evaluation     [place] Name 07/04/21 1233             Psychosocial Re-Evaluation   Current issues with Current Sleep Concerns       Comments Patient is new to the program starting today. He continues to have no psychosocial barriers identified. He continues to use Melantonin for sleep. We will continue to monitor.       Expected Outcomes Patient will continue to have no psychosocial barriers identified.       Interventions Stress management education;Encouraged to attend Cardiac Rehabilitation for the exercise;Relaxation education       Continue Psychosocial Services  No Follow up required                Psychosocial Discharge (Final Psychosocial Re-Evaluation):  Psychosocial Re-Evaluation - 07/04/21 1233       Psychosocial Re-Evaluation   Current issues with Current Sleep Concerns    Comments Patient is new to the program starting today. He continues to have no psychosocial barriers identified. He continues to use Melantonin for sleep. We will continue to monitor.    Expected Outcomes Patient will continue to have no psychosocial barriers identified.    Interventions Stress management education;Encouraged to attend Cardiac Rehabilitation for the exercise;Relaxation education    Continue Psychosocial Services  No Follow up required             Vocational Rehabilitation: Provide vocational rehab assistance to qualifying candidates.   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             Education: Education Goals: Education classes will be provided on a weekly basis, covering required topics. Participant will state understanding/return demonstration  of topics  presented.  Learning Barriers/Preferences:  Learning Barriers/Preferences - 07/02/21 1313       Learning Barriers/Preferences   Learning Barriers None    Learning Preferences Skilled Demonstration;Audio;Computer/Internet;Group Instruction;Individual Instruction;Pictoral;Verbal Instruction;Video;Written Material             Education Topics: Hypertension, Hypertension Reduction -Define heart disease and high blood pressure. Discus how high blood pressure affects the body and ways to reduce high blood pressure.   Exercise and Your Heart -Discuss why it is important to exercise, the FITT principles of exercise, normal and abnormal responses to exercise, and how to exercise safely.   Angina -Discuss definition of angina, causes of angina, treatment of angina, and how to decrease risk of having angina.   Cardiac Medications -Review what the following cardiac medications are used for, how they affect the body, and side effects that may occur when taking the medications.  Medications include Aspirin, Beta blockers, calcium channel blockers, ACE Inhibitors, angiotensin receptor blockers, diuretics, digoxin, and antihyperlipidemics.   Congestive Heart Failure -Discuss the definition of CHF, how to live with CHF, the signs and symptoms of CHF, and how keep track of weight and sodium intake.   Heart Disease and Intimacy -Discus the effect sexual activity has on the heart, how changes occur during intimacy as we age, and safety during sexual activity.   Smoking Cessation / COPD -Discuss different methods to quit smoking, the health benefits of quitting smoking, and the definition of COPD.   Nutrition I: Fats -Discuss the types of cholesterol, what cholesterol does to the heart, and how cholesterol levels can be controlled.   Nutrition II: Labels -Discuss the different components of food labels and how to read food label   Heart Parts/Heart Disease and PAD -Discuss  the anatomy of the heart, the pathway of blood circulation through the heart, and these are affected by heart disease. Flowsheet Row CARDIAC REHAB PHASE II EXERCISE from 07/04/2021 in [place]  Date 07/04/21  Educator hj  Instruction Review Code 2- Demonstrated Understanding       Stress I: Signs and Symptoms -Discuss the causes of stress, how stress may lead to anxiety and depression, and ways to limit stress.   Stress II: Relaxation -Discuss different types of relaxation techniques to limit stress.   Warning Signs of Stroke / TIA -Discuss definition of a stroke, what the signs and symptoms are of a stroke, and how to identify when someone is having stroke.   Knowledge Questionnaire Score:  Knowledge Questionnaire Score - 07/02/21 1314       Knowledge Questionnaire Score   Pre Score 22/24             Core Components/Risk Factors/Patient Goals at Admission:  Personal Goals and Risk Factors at Admission - 07/02/21 1320       Core Components/Risk Factors/Patient Goals on Admission   Hypertension Yes    Intervention Provide education on lifestyle modifcations including regular physical activity/exercise, weight management, moderate sodium restriction and increased consumption of fresh fruit, vegetables, and low fat dairy, alcohol moderation, and smoking cessation.;Monitor prescription use compliance.    Expected Outcomes Short Term: Continued assessment and intervention until BP is < 140/64m HG in hypertensive participants. < 130/872mHG in hypertensive participants with diabetes, heart failure or chronic kidney disease.;Long Term: Maintenance of blood pressure at goal levels.    Lipids Yes    Intervention Provide education and support for participant on nutrition & aerobic/resistive exercise along with prescribed medications to achieve LDL '70mg'$ , HDL >  $'40mg'H$ .    Expected Outcomes Short Term: Participant states understanding of desired cholesterol  values and is compliant with medications prescribed. Participant is following exercise prescription and nutrition guidelines.;Long Term: Cholesterol controlled with medications as prescribed, with individualized exercise RX and with personalized nutrition plan. Value goals: LDL < '70mg'$ , HDL > 40 mg.    Personal Goal Other Yes    Personal Goal Improve strength and stamina.    Intervention Attend CR three days per week and continue with his home exercise program.    Expected Outcomes Pt will meet his personal goals.             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 He has multiple risk factors for CAD and is participating in the program for risk modification. He started the program today. His personal goals for the program are to improve his strength and stamina. We will continue to monitor his progress as he works towards meeting these goals.       Expected Outcomes Patient will complete the program meeting both personal and program goals.                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 He has multiple risk factors for CAD and is participating in the program for risk modification. He started the program today. His personal goals for the program are to improve his strength and stamina. We will continue to monitor his progress as he works towards meeting these goals.    Expected Outcomes Patient will complete the program meeting both personal and program goals.             ITP  Comments:   Comments: ITP REVIEW Pt is making expected progress toward Cardiac Rehab goals after completing 3 sessions. Recommend continued exercise, life style modification, education, and increased stamina and strength.

## 2021-07-11 NOTE — Progress Notes (Signed)
Daily Session Note  Patient Details  Name: Nathaniel Bender MRN: [id number] Date of Birth: 1950/03/09 Referring Provider:   Flowsheet Row CARDIAC REHAB PHASE II ORIENTATION from 07/02/2021 in [place]  Referring Provider Dr. Kipp Brood       Encounter Date: 07/11/2021  Check In:  Session Check In - 07/11/21 1100       Check-In   Supervising physician immediately available to respond to emergencies [facility] MD immediately available    Physician(s) Dr Gasper Sells    Location [facility]    Staff Present Redge Gainer, BS, Exercise Physiologist;Kelaiah Escalona Hassell Done, RN, Jennye Moccasin, RN, BSN    Virtual Visit No    Medication changes reported     No    Fall or balance concerns reported    No    Tobacco Cessation No Change    Warm-up and Cool-down Performed as group-led instruction    Resistance Training Performed Yes    VAD Patient? No    PAD/SET Patient? No      Pain Assessment   Currently in Pain? No/denies    Multiple Pain Sites No             Capillary Blood Glucose: No results found for this or any previous visit (from the past 24 hour(s)).    Social History   Tobacco Use  Smoking Status Never  Smokeless Tobacco Former    Goals Met:  Independence with exercise equipment Exercise tolerated well No report of concerns or symptoms today Strength training completed today  Goals Unmet:  Not Applicable  Comments: Checkout at 1200.   Dr. Carlyle Dolly is Medical Director for [facility]

## 2021-07-13 ENCOUNTER — Encounter (HOSPITAL_COMMUNITY)
Admission: RE | Admit: 2021-07-13 | Discharge: 2021-07-13 | Disposition: A | Discharge status: Home / Self Care | Payer: Medicare Other | Source: Ambulatory Visit | Attending: Thoracic Surgery (Cardiothoracic Vascular Surgery) | Admitting: Thoracic Surgery (Cardiothoracic Vascular Surgery)

## 2021-07-13 DIAGNOSIS — Z951 Presence of aortocoronary bypass graft: Secondary | ICD-10-CM

## 2021-07-13 DIAGNOSIS — I214 Non-ST elevation (NSTEMI) myocardial infarction: Secondary | ICD-10-CM

## 2021-07-16 ENCOUNTER — Encounter (HOSPITAL_COMMUNITY)
Admission: RE | Admit: 2021-07-16 | Discharge: 2021-07-16 | Disposition: A | Discharge status: Home / Self Care | Payer: Medicare Other | Source: Ambulatory Visit | Attending: Thoracic Surgery (Cardiothoracic Vascular Surgery) | Admitting: Thoracic Surgery (Cardiothoracic Vascular Surgery)

## 2021-07-16 DIAGNOSIS — I214 Non-ST elevation (NSTEMI) myocardial infarction: Secondary | ICD-10-CM

## 2021-07-16 DIAGNOSIS — Z951 Presence of aortocoronary bypass graft: Secondary | ICD-10-CM | POA: Diagnosis not present

## 2021-07-18 ENCOUNTER — Encounter (HOSPITAL_COMMUNITY)
Admission: RE | Admit: 2021-07-18 | Discharge: 2021-07-18 | Disposition: A | Discharge status: Home / Self Care | Payer: Medicare Other | Source: Ambulatory Visit | Attending: Thoracic Surgery (Cardiothoracic Vascular Surgery) | Admitting: Thoracic Surgery (Cardiothoracic Vascular Surgery)

## 2021-07-18 DIAGNOSIS — Z951 Presence of aortocoronary bypass graft: Secondary | ICD-10-CM

## 2021-07-18 DIAGNOSIS — I214 Non-ST elevation (NSTEMI) myocardial infarction: Secondary | ICD-10-CM

## 2021-07-18 NOTE — Progress Notes (Signed)
Daily Session Note  Patient Details  Name: Nathaniel Bender MRN: [id number] Date of Birth: Jun 15, 1950 Referring Provider:   Flowsheet Row CARDIAC REHAB PHASE II ORIENTATION from 07/02/2021 in [place]  Referring Provider Dr. Kipp Brood       Encounter Date: 07/18/2021  Check In:  Session Check In - 07/18/21 1100       Check-In   Supervising physician immediately available to respond to emergencies [facility] MD immediately available    Physician(s) Dr Harl Bowie    Location [facility]    Staff Present Redge Gainer, BS, Exercise Physiologist;Alfredia Desanctis Wynetta Emery, RN, Joanette Gula, RN, BSN    Virtual Visit No    Medication changes reported     No    Fall or balance concerns reported    No    Tobacco Cessation No Change    Warm-up and Cool-down Performed as group-led instruction    Resistance Training Performed Yes    VAD Patient? No    PAD/SET Patient? No      Pain Assessment   Currently in Pain? No/denies    Multiple Pain Sites No             Capillary Blood Glucose: No results found for this or any previous visit (from the past 24 hour(s)).    Social History   Tobacco Use  Smoking Status Never  Smokeless Tobacco Former    Goals Met:  Independence with exercise equipment Exercise tolerated well No report of concerns or symptoms today Strength training completed today  Goals Unmet:  Not Applicable  Comments: Check out 1200.   Dr. Carlyle Dolly is Medical Director for [facility]

## 2021-07-20 ENCOUNTER — Encounter (HOSPITAL_COMMUNITY)
Admission: RE | Admit: 2021-07-20 | Discharge: 2021-07-20 | Disposition: A | Discharge status: Home / Self Care | Payer: Medicare Other | Source: Ambulatory Visit | Attending: Thoracic Surgery (Cardiothoracic Vascular Surgery) | Admitting: Thoracic Surgery (Cardiothoracic Vascular Surgery)

## 2021-07-20 DIAGNOSIS — I214 Non-ST elevation (NSTEMI) myocardial infarction: Secondary | ICD-10-CM

## 2021-07-20 DIAGNOSIS — Z951 Presence of aortocoronary bypass graft: Secondary | ICD-10-CM | POA: Diagnosis not present

## 2021-07-20 NOTE — Progress Notes (Signed)
Daily Session Note  Patient Details  Name: Nathaniel Bender MRN: [id number] Date of Birth: 01-May-1950 Referring Provider:   Flowsheet Row CARDIAC REHAB PHASE II ORIENTATION from 07/02/2021 in [place]  Referring Provider Dr. Kipp Brood       Encounter Date: 07/20/2021  Check In:  Session Check In - 07/20/21 1100       Check-In   Supervising physician immediately available to respond to emergencies [facility] MD immediately available    Physician(s) Gasper Sells    Location [facility]    Staff Present Redge Gainer, BS, Exercise Physiologist;Daphyne Hassell Done, RN, Jennye Moccasin, RN, BSN    Virtual Visit No    Medication changes reported     No    Fall or balance concerns reported    No    Tobacco Cessation No Change    Warm-up and Cool-down Performed as group-led instruction    Resistance Training Performed Yes    VAD Patient? No    PAD/SET Patient? No      Pain Assessment   Currently in Pain? No/denies    Multiple Pain Sites No             Capillary Blood Glucose: No results found for this or any previous visit (from the past 24 hour(s)).    Social History   Tobacco Use  Smoking Status Never  Smokeless Tobacco Former    Goals Met:  Independence with exercise equipment Exercise tolerated well No report of concerns or symptoms today Strength training completed today  Goals Unmet:  Not Applicable  Comments: Check out 1200.   Dr. Carlyle Dolly is Medical Director for [facility]

## 2021-07-23 ENCOUNTER — Encounter (HOSPITAL_COMMUNITY)
Admission: RE | Admit: 2021-07-23 | Discharge: 2021-07-23 | Disposition: A | Discharge status: Home / Self Care | Payer: Medicare Other | Source: Ambulatory Visit | Attending: Thoracic Surgery (Cardiothoracic Vascular Surgery) | Admitting: Thoracic Surgery (Cardiothoracic Vascular Surgery)

## 2021-07-23 VITALS — Wt 168.4 lb

## 2021-07-23 DIAGNOSIS — Z951 Presence of aortocoronary bypass graft: Secondary | ICD-10-CM | POA: Diagnosis not present

## 2021-07-23 DIAGNOSIS — I214 Non-ST elevation (NSTEMI) myocardial infarction: Secondary | ICD-10-CM | POA: Insufficient documentation

## 2021-07-23 NOTE — Progress Notes (Signed)
Daily Session Note  Patient Details  Name: Nathaniel Bender MRN: [id number] Date of Birth: 02/06/1950 Referring Provider:   Flowsheet Row CARDIAC REHAB PHASE II ORIENTATION from 07/02/2021 in [place]  Referring Provider Dr. Kipp Brood       Encounter Date: 07/23/2021  Check In:  Session Check In - 07/23/21 1100       Check-In   Supervising physician immediately available to respond to emergencies [facility] immediately available    Physician(s) Dr. Johnsie Cancel    Location [facility]    Staff Present Geanie Cooley, RN;Dalton Kris Mouton, MS, ACSM-CEP, Exercise Physiologist;Heather Zigmund Daniel, Exercise Physiologist    Virtual Visit No    Medication changes reported     No    Fall or balance concerns reported    No    Tobacco Cessation No Change    Warm-up and Cool-down Performed as group-led instruction    Resistance Training Performed Yes    VAD Patient? No    PAD/SET Patient? No      Pain Assessment   Currently in Pain? No/denies    Multiple Pain Sites No             Capillary Blood Glucose: No results found for this or any previous visit (from the past 24 hour(s)).    Social History   Tobacco Use  Smoking Status Never  Smokeless Tobacco Former    Goals Met:  Independence with exercise equipment Exercise tolerated well No report of concerns or symptoms today Strength training completed today  Goals Unmet:  Not Applicable  Comments: check out @ 12:00pm   Dr. Carlyle Dolly is Medical Director for [facility]

## 2021-07-25 ENCOUNTER — Encounter (HOSPITAL_COMMUNITY): Payer: Medicare Other

## 2021-07-27 ENCOUNTER — Encounter (HOSPITAL_COMMUNITY)
Admission: RE | Admit: 2021-07-27 | Discharge: 2021-07-27 | Disposition: A | Discharge status: Home / Self Care | Payer: Medicare Other | Source: Ambulatory Visit | Attending: Thoracic Surgery (Cardiothoracic Vascular Surgery) | Admitting: Thoracic Surgery (Cardiothoracic Vascular Surgery)

## 2021-07-27 DIAGNOSIS — I214 Non-ST elevation (NSTEMI) myocardial infarction: Secondary | ICD-10-CM

## 2021-07-27 DIAGNOSIS — Z951 Presence of aortocoronary bypass graft: Secondary | ICD-10-CM | POA: Diagnosis not present

## 2021-07-27 NOTE — Progress Notes (Signed)
Daily Session Note  Patient Details  Name: Nathaniel Bender MRN: [id number] Date of Birth: 1950/07/21 Referring Provider:   Flowsheet Row CARDIAC REHAB PHASE II ORIENTATION from 07/02/2021 in [place]  Referring Provider Dr. Kipp Brood       Encounter Date: 07/27/2021  Check In:  Session Check In - 07/27/21 1100       Check-In   Supervising physician immediately available to respond to emergencies [facility] immediately available    Physician(s) Dr. Marlou Porch    Location [facility]    Staff Present Hoy Register, MS, ACSM-CEP, Exercise Physiologist;Tyrelle Raczka Zigmund Daniel, Exercise Physiologist;Debra Wynetta Emery, RN, BSN    Virtual Visit No    Medication changes reported     No    Fall or balance concerns reported    No    Tobacco Cessation No Change    Warm-up and Cool-down Performed as group-led instruction    Resistance Training Performed Yes    VAD Patient? No    PAD/SET Patient? No      Pain Assessment   Currently in Pain? No/denies    Multiple Pain Sites No             Capillary Blood Glucose: No results found for this or any previous visit (from the past 24 hour(s)).    Social History   Tobacco Use  Smoking Status Never  Smokeless Tobacco Former    Goals Met:  Independence with exercise equipment Exercise tolerated well No report of concerns or symptoms today Strength training completed today  Goals Unmet:  Not Applicable  Comments: check out 1200   Dr. Carlyle Dolly is Medical Director for [place]

## 2021-07-30 ENCOUNTER — Encounter (HOSPITAL_COMMUNITY)
Admission: RE | Admit: 2021-07-30 | Discharge: 2021-07-30 | Disposition: A | Discharge status: Home / Self Care | Payer: Medicare Other | Source: Ambulatory Visit | Attending: Thoracic Surgery (Cardiothoracic Vascular Surgery) | Admitting: Thoracic Surgery (Cardiothoracic Vascular Surgery)

## 2021-07-30 DIAGNOSIS — Z951 Presence of aortocoronary bypass graft: Secondary | ICD-10-CM

## 2021-07-30 DIAGNOSIS — I214 Non-ST elevation (NSTEMI) myocardial infarction: Secondary | ICD-10-CM

## 2021-07-30 NOTE — Progress Notes (Signed)
Daily Session Note  Patient Details  Name: Nathaniel Bender MRN: [id number] Date of Birth: 1950/05/01 Referring Provider:   Flowsheet Row CARDIAC REHAB PHASE II ORIENTATION from 07/02/2021 in [place]  Referring Provider Dr. Kipp Brood       Encounter Date: 07/30/2021  Check In:  Session Check In - 07/30/21 1100       Check-In   Supervising physician immediately available to respond to emergencies [facility] immediately available    Physician(s) Dr. Harrington Challenger    Location [facility]    Staff Present Hoy Register, MS, ACSM-CEP, Exercise Physiologist;Lucine Bilski Otho Ket, BS, Exercise Physiologist;Debra Wynetta Emery, RN, BSN    Virtual Visit No    Medication changes reported     No    Fall or balance concerns reported    No    Tobacco Cessation No Change    Warm-up and Cool-down Performed as group-led instruction    Resistance Training Performed Yes    VAD Patient? No    PAD/SET Patient? No      Pain Assessment   Currently in Pain? No/denies    Multiple Pain Sites No             Capillary Blood Glucose: No results found for this or any previous visit (from the past 24 hour(s)).    Social History   Tobacco Use  Smoking Status Never  Smokeless Tobacco Former    Goals Met:  Independence with exercise equipment Exercise tolerated well No report of concerns or symptoms today Strength training completed today  Goals Unmet:  Not Applicable  Comments: check out 1200   Dr. Carlyle Dolly is Medical Director for [place]

## 2021-08-01 ENCOUNTER — Encounter (HOSPITAL_COMMUNITY)
Admission: RE | Admit: 2021-08-01 | Discharge: 2021-08-01 | Disposition: A | Discharge status: Home / Self Care | Payer: Medicare Other | Source: Ambulatory Visit | Attending: Thoracic Surgery (Cardiothoracic Vascular Surgery) | Admitting: Thoracic Surgery (Cardiothoracic Vascular Surgery)

## 2021-08-01 DIAGNOSIS — Z951 Presence of aortocoronary bypass graft: Secondary | ICD-10-CM

## 2021-08-01 DIAGNOSIS — I214 Non-ST elevation (NSTEMI) myocardial infarction: Secondary | ICD-10-CM

## 2021-08-01 NOTE — Progress Notes (Signed)
Daily Session Note  Patient Details  Name: Nathaniel Bender MRN: [id number] Date of Birth: December 11, 1950 Referring Provider:   Flowsheet Row CARDIAC REHAB PHASE II ORIENTATION from 07/02/2021 in [place]  Referring Provider Dr. Kipp Brood       Encounter Date: 08/01/2021  Check In:  Session Check In - 08/01/21 1100       Check-In   Supervising physician immediately available to respond to emergencies [facility] MD immediately available    Physician(s) Dr Audie Box    Location [facility]    Staff Present Redge Gainer, BS, Exercise Physiologist;Debra Wynetta Emery, RN, Joanette Gula, RN, BSN    Virtual Visit No    Medication changes reported     No    Fall or balance concerns reported    No    Tobacco Cessation No Change    Warm-up and Cool-down Performed as group-led instruction    Resistance Training Performed Yes    VAD Patient? No    PAD/SET Patient? No      Pain Assessment   Currently in Pain? No/denies    Multiple Pain Sites No             Capillary Blood Glucose: No results found for this or any previous visit (from the past 24 hour(s)).    Social History   Tobacco Use  Smoking Status Never  Smokeless Tobacco Former    Goals Met:  Independence with exercise equipment Exercise tolerated well No report of concerns or symptoms today Strength training completed today  Goals Unmet:  Not Applicable  Comments: checkout at 1200.   Dr. Carlyle Dolly is Medical Director for [facility]

## 2021-08-03 ENCOUNTER — Encounter (HOSPITAL_COMMUNITY)
Admission: RE | Admit: 2021-08-03 | Discharge: 2021-08-03 | Disposition: A | Discharge status: Home / Self Care | Payer: Medicare Other | Source: Ambulatory Visit | Attending: Thoracic Surgery (Cardiothoracic Vascular Surgery) | Admitting: Thoracic Surgery (Cardiothoracic Vascular Surgery)

## 2021-08-03 DIAGNOSIS — I214 Non-ST elevation (NSTEMI) myocardial infarction: Secondary | ICD-10-CM

## 2021-08-03 DIAGNOSIS — Z951 Presence of aortocoronary bypass graft: Secondary | ICD-10-CM

## 2021-08-03 NOTE — Progress Notes (Signed)
Daily Session Note  Patient Details  Name: Nathaniel Bender MRN: [id number] Date of Birth: [birth year] Referring Provider:   Flowsheet Row CARDIAC REHAB PHASE II ORIENTATION from 07/02/2021 in [place]  Referring Provider Dr. Kipp Brood       Encounter Date: 08/03/2021  Check In:  Session Check In - 08/03/21 1100       Check-In   Supervising physician immediately available to respond to emergencies [facility] MD immediately available    Physician(s) Dr. Radford Pax    Location [facility]    Staff Present Hoy Register, MS, ACSM-CEP, Exercise Physiologist;Heather Otho Ket, BS, Exercise Physiologist;Debra Wynetta Emery, RN, BSN    Virtual Visit No    Medication changes reported     No    Fall or balance concerns reported    No    Tobacco Cessation No Change    Warm-up and Cool-down Performed as group-led instruction    Resistance Training Performed Yes    VAD Patient? No    PAD/SET Patient? No      Pain Assessment   Currently in Pain? No/denies    Multiple Pain Sites No             Capillary Blood Glucose: No results found for this or any previous visit (from the past 24 hour(s)).    Social History   Tobacco Use  Smoking Status Never  Smokeless Tobacco Former    Goals Met:  Independence with exercise equipment Exercise tolerated well No report of concerns or symptoms today Strength training completed today  Goals Unmet:  Not Applicable  Comments: checkout time is 1200   Dr. Carlyle Dolly is Medical Director for [facility]

## 2021-08-06 ENCOUNTER — Encounter (HOSPITAL_COMMUNITY)
Admission: RE | Admit: 2021-08-06 | Discharge: 2021-08-06 | Disposition: A | Discharge status: Home / Self Care | Payer: Medicare Other | Source: Ambulatory Visit | Attending: Thoracic Surgery (Cardiothoracic Vascular Surgery) | Admitting: Thoracic Surgery (Cardiothoracic Vascular Surgery)

## 2021-08-06 VITALS — Wt 166.9 lb

## 2021-08-06 DIAGNOSIS — Z951 Presence of aortocoronary bypass graft: Secondary | ICD-10-CM

## 2021-08-06 DIAGNOSIS — I214 Non-ST elevation (NSTEMI) myocardial infarction: Secondary | ICD-10-CM

## 2021-08-06 NOTE — Progress Notes (Signed)
Daily Session Note  Patient Details  Name: Nathaniel Bender MRN: [id number] Date of Birth: 1950/11/10 Referring Provider:   Flowsheet Row CARDIAC REHAB PHASE II ORIENTATION from 07/02/2021 in [place]  Referring Provider Dr. Kipp Brood       Encounter Date: 08/06/2021  Check In:  Session Check In - 08/06/21 1100       Check-In   Supervising physician immediately available to respond to emergencies [facility] MD immediately available    Physician(s) Dr. Phineas Inches    Location [facility]    Staff Present Hoy Register, MS, ACSM-CEP, Exercise Physiologist;Earl Zellmer Otho Ket, BS, Exercise Physiologist;Debra Wynetta Emery, RN, BSN    Virtual Visit No    Medication changes reported     No    Fall or balance concerns reported    No    Tobacco Cessation No Change    Warm-up and Cool-down Performed as group-led instruction    Resistance Training Performed Yes    VAD Patient? No    PAD/SET Patient? No      Pain Assessment   Currently in Pain? No/denies    Multiple Pain Sites No             Capillary Blood Glucose: No results found for this or any previous visit (from the past 24 hour(s)).    Social History   Tobacco Use  Smoking Status Never  Smokeless Tobacco Former    Goals Met:  Independence with exercise equipment Exercise tolerated well No report of concerns or symptoms today Strength training completed today  Goals Unmet:  Not Applicable  Comments: check out 1200   Dr. Carlyle Dolly is Medical Director for [place]

## 2021-08-08 ENCOUNTER — Encounter (HOSPITAL_COMMUNITY)
Admission: RE | Admit: 2021-08-08 | Discharge: 2021-08-08 | Disposition: A | Discharge status: Home / Self Care | Payer: Medicare Other | Source: Ambulatory Visit | Attending: Thoracic Surgery (Cardiothoracic Vascular Surgery) | Admitting: Thoracic Surgery (Cardiothoracic Vascular Surgery)

## 2021-08-08 DIAGNOSIS — Z951 Presence of aortocoronary bypass graft: Secondary | ICD-10-CM | POA: Diagnosis not present

## 2021-08-08 DIAGNOSIS — I214 Non-ST elevation (NSTEMI) myocardial infarction: Secondary | ICD-10-CM

## 2021-08-08 NOTE — Progress Notes (Signed)
Cardiac Individual Treatment Plan  Patient Details  Name: Nathaniel Bender MRN: [id number] Date of Birth: 1950-01-28 Referring Provider:   Flowsheet Row CARDIAC REHAB PHASE II ORIENTATION from 07/02/2021 in [place]  Referring Provider Dr. Kipp Brood       Initial Encounter Date:  Flowsheet Row CARDIAC REHAB PHASE II ORIENTATION from 07/02/2021 in [place]  Date 07/02/21       Visit Diagnosis: S/P CABG x 4  NSTEMI (non-ST elevated myocardial infarction) ([place])  Patient's Home Medications on Admission:  Current Outpatient Medications:    acetaminophen (TYLENOL) 500 MG tablet, Take 1,000 mg by mouth every 6 (six) hours as needed (for pain.)., Disp: , Rfl:    amLODipine (NORVASC) 5 MG tablet, Take 1 tablet (5 mg total) by mouth daily., Disp: 30 tablet, Rfl: 0   aspirin EC 325 MG EC tablet, Take 1 tablet (325 mg total) by mouth daily., Disp: , Rfl:    atorvastatin (LIPITOR) 80 MG tablet, Take 1 tablet (80 mg total) by mouth daily., Disp: 90 tablet, Rfl: 3   ezetimibe (ZETIA) 10 MG tablet, Take 1 tablet (10 mg total) by mouth daily. (Patient taking differently: Take 10 mg by mouth every evening.), Disp: 90 tablet, Rfl: 3   furosemide (LASIX) 20 MG tablet, Take 1 tablet (20 mg total) by mouth as needed. (Patient not taking: Reported on 06/26/2021), Disp: 10 tablet, Rfl: 0   losartan (COZAAR) 50 MG tablet, Take 50 mg by mouth daily., Disp: , Rfl:    Metoprolol Tartrate 37.5 MG TABS, Take 37.5 mg by mouth 2 (two) times daily., Disp: 180 tablet, Rfl: 1   naproxen sodium (ALEVE) 220 MG tablet, Take 220 mg by mouth daily as needed (pain.)., Disp: , Rfl:    omeprazole (PRILOSEC) 20 MG capsule, Take 1 capsule (20 mg total) by mouth daily., Disp: 90 capsule, Rfl: 0   OVER THE COUNTER MEDICATION, Place 1 patch onto the skin at bedtime. Zleep Sleep Patches with Dream Complex (Melatonin, Magnesium, Potassium, 5-HTP, and Zinc), Disp: , Rfl:    Turmeric 500  MG TABS, Take 500 mg by mouth daily., Disp: , Rfl:   Past Medical History: Past Medical History:  Diagnosis Date   Arthritis    Degenerative Disc Disease Cervical; s/p injection [facility].   Atypical mole 05/04/2013   Right Wrist-Severe   Cancer ([place]) 07/21/2013   Skin cancer unknown type   Colon polyps    GERD (gastroesophageal reflux disease)    Gout    Hyperlipidemia    Hypertension    Meniere's disease of left ear    s/p ENT evaluation; acute hearing loss; chronic tinnitus L now.  Recovered hearing completely.   SCCA (squamous cell carcinoma) of skin 08/08/2014   Left Forehead-Well Diff (curet and 5FU)    Tobacco Use: Social History   Tobacco Use  Smoking Status Never  Smokeless Tobacco Former    Labs: Review Flowsheet  More data exists      Latest Ref Rng & Units 07/30/2019 05/06/2021 05/07/2021 05/08/2021 06/15/2021  Labs for ITP Cardiac and Pulmonary Rehab  Cholestrol 100 - 199 mg/dL 172  178  - - 155   LDL (calc) 0 - 99 mg/dL 96  95  - - 83   HDL-C >39 mg/dL 44  53  - - 49   Trlycerides 0 - 149 mg/dL 183  148  - - 128   Hemoglobin A1c 4.8 - 5.6 % 5.9  - - 6.1  -  PH, Arterial 7.35 - 7.45 - - 7.44  7.355  7.369  7.377  7.405  7.461  7.441  7.372  -  PCO2 arterial 32 - 48 mmHg - - 35  38.2  37.4  38.3  35.4  34.8  38.5  42.5  -  Bicarbonate 20.0 - 28.0 mmol/L - - 23.8  21.5  21.7  22.7  22.2  24.8  33.9  26.2  24.6  -  TCO2 22 - 32 mmol/L - - - '23  23  24  23  23  26  26  26  '$ 35  '27  25  26  29  '$ -  Acid-base deficit 0.0 - 2.0 mmol/L - - - 4.0  3.0  2.0  2.0  1.0  -  O2 Saturation % - - 93.2  97  95  97  97  100  77  100  100  -    Capillary Blood Glucose: Lab Results  Component Value Date   GLUCAP 121 (H) 05/12/2021   GLUCAP 81 05/12/2021   GLUCAP 164 (H) 05/11/2021   GLUCAP 98 05/11/2021   GLUCAP 171 (H) 05/11/2021     Exercise Target Goals: Exercise Program Goal: Individual exercise prescription set using results from initial 6 min walk test and  THRR while considering  patient's activity barriers and safety.   Exercise Prescription Goal: Starting with aerobic activity 30 plus minutes a day, 3 days per week for initial exercise prescription. Provide home exercise prescription and guidelines that participant acknowledges understanding prior to discharge.  Activity Barriers & Risk Stratification:  Activity Barriers & Cardiac Risk Stratification - 07/02/21 1241       Activity Barriers & Cardiac Risk Stratification   Activity Barriers Joint Problems    Cardiac Risk Stratification High             6 Minute Walk:  6 Minute Walk     Row Name 07/02/21 1319         6 Minute Walk   Phase Initial     Distance 1600 feet     Walk Time 6 minutes     # of Rest Breaks 0     MPH 3.03     METS 3.05     RPE 11     VO2 Peak 10.69     Symptoms No     Resting HR 42 bpm     Resting BP 98/64     Resting Oxygen Saturation  97 %     Exercise Oxygen Saturation  during 6 min walk 97 %     Max Ex. HR 54 bpm     Max Ex. BP 122/60     2 Minute Post BP 110/62              Oxygen Initial Assessment:   Oxygen Re-Evaluation:   Oxygen Discharge (Final Oxygen Re-Evaluation):   Initial Exercise Prescription:  Initial Exercise Prescription - 07/02/21 1300       Date of Initial Exercise RX and Referring Provider   Date 07/02/21    Referring Provider Dr. Kipp Brood    Expected Discharge Date 09/24/21      Treadmill   MPH 1.5    Grade 0    Minutes 22      Recumbant Elliptical   Level 1    RPM 60    Minutes 17      Prescription Details   Frequency (times per week) 3    Duration  Progress to 30 minutes of continuous aerobic without signs/symptoms of physical distress      Intensity   THRR 40-80% of Max Heartrate 60-121    Ratings of Perceived Exertion 11-13      Resistance Training   Training Prescription Yes    Weight 4    Reps 10-15             Perform Capillary Blood Glucose checks as needed.  Exercise  Prescription Changes:   Exercise Prescription Changes     Row Name 07/09/21 1400 07/23/21 1200 08/06/21 1300         Response to Exercise   Blood Pressure (Admit) 128/66 130/70 120/68     Blood Pressure (Exercise) 138/60 150/62 118/60     Blood Pressure (Exit) 110/66 124/62 100/60     Heart Rate (Admit) 45 bpm 46 bpm 47 bpm     Heart Rate (Exercise) 71 bpm 81 bpm 74 bpm     Heart Rate (Exit) 53 bpm 55 bpm 56 bpm     Rating of Perceived Exertion (Exercise) '12 12 12     '$ Duration Continue with 30 min of aerobic exercise without signs/symptoms of physical distress. Continue with 30 min of aerobic exercise without signs/symptoms of physical distress. Continue with 30 min of aerobic exercise without signs/symptoms of physical distress.     Intensity THRR unchanged THRR unchanged THRR unchanged       Progression   Progression Continue to progress workloads to maintain intensity without signs/symptoms of physical distress. Continue to progress workloads to maintain intensity without signs/symptoms of physical distress. Continue to progress workloads to maintain intensity without signs/symptoms of physical distress.       Resistance Training   Training Prescription Yes Yes Yes     Weight '5 5 5     '$ Reps 10-15 10-15 10-15     Time 10 Minutes 10 Minutes 10 Minutes       Treadmill   MPH 2.6 2.6 2.5     Grade 0 1 3     Minutes '17 17 17     '$ METs 2.99 3.35 3.95       Recumbant Elliptical   Level '1 3 5     '$ RPM 61 69 69     Minutes '22 22 22     '$ METs 4.8 5.9 6.6              Exercise Comments:   Exercise Goals and Review:   Exercise Goals     Row Name 07/02/21 1321 07/09/21 1436 08/06/21 1339         Exercise Goals   Increase Physical Activity Yes Yes Yes     Intervention Provide advice, education, support and counseling about physical activity/exercise needs.;Develop an individualized exercise prescription for aerobic and resistive training based on initial evaluation  findings, risk stratification, comorbidities and participant's personal goals. Provide advice, education, support and counseling about physical activity/exercise needs.;Develop an individualized exercise prescription for aerobic and resistive training based on initial evaluation findings, risk stratification, comorbidities and participant's personal goals. Provide advice, education, support and counseling about physical activity/exercise needs.;Develop an individualized exercise prescription for aerobic and resistive training based on initial evaluation findings, risk stratification, comorbidities and participant's personal goals.     Expected Outcomes Short Term: Attend rehab on a regular basis to increase amount of physical activity.;Long Term: Add in home exercise to make exercise part of routine and to increase amount of physical activity.;Long Term: Exercising regularly at least 3-5  days a week. Short Term: Attend rehab on a regular basis to increase amount of physical activity.;Long Term: Add in home exercise to make exercise part of routine and to increase amount of physical activity.;Long Term: Exercising regularly at least 3-5 days a week. Short Term: Attend rehab on a regular basis to increase amount of physical activity.;Long Term: Add in home exercise to make exercise part of routine and to increase amount of physical activity.;Long Term: Exercising regularly at least 3-5 days a week.     Increase Strength and Stamina Yes Yes Yes     Intervention Provide advice, education, support and counseling about physical activity/exercise needs.;Develop an individualized exercise prescription for aerobic and resistive training based on initial evaluation findings, risk stratification, comorbidities and participant's personal goals. Provide advice, education, support and counseling about physical activity/exercise needs.;Develop an individualized exercise prescription for aerobic and resistive training based on  initial evaluation findings, risk stratification, comorbidities and participant's personal goals. Provide advice, education, support and counseling about physical activity/exercise needs.;Develop an individualized exercise prescription for aerobic and resistive training based on initial evaluation findings, risk stratification, comorbidities and participant's personal goals.     Expected Outcomes Short Term: Increase workloads from initial exercise prescription for resistance, speed, and METs.;Short Term: Perform resistance training exercises routinely during rehab and add in resistance training at home;Long Term: Improve cardiorespiratory fitness, muscular endurance and strength as measured by increased METs and functional capacity (6MWT) Short Term: Increase workloads from initial exercise prescription for resistance, speed, and METs.;Short Term: Perform resistance training exercises routinely during rehab and add in resistance training at home;Long Term: Improve cardiorespiratory fitness, muscular endurance and strength as measured by increased METs and functional capacity (6MWT) Short Term: Increase workloads from initial exercise prescription for resistance, speed, and METs.;Short Term: Perform resistance training exercises routinely during rehab and add in resistance training at home;Long Term: Improve cardiorespiratory fitness, muscular endurance and strength as measured by increased METs and functional capacity (6MWT)     Able to understand and use rate of perceived exertion (RPE) scale Yes Yes Yes     Intervention Provide education and explanation on how to use RPE scale Provide education and explanation on how to use RPE scale Provide education and explanation on how to use RPE scale     Expected Outcomes Short Term: Able to use RPE daily in rehab to express subjective intensity level;Long Term:  Able to use RPE to guide intensity level when exercising independently Short Term: Able to use RPE daily in  rehab to express subjective intensity level;Long Term:  Able to use RPE to guide intensity level when exercising independently Short Term: Able to use RPE daily in rehab to express subjective intensity level;Long Term:  Able to use RPE to guide intensity level when exercising independently     Knowledge and understanding of Target Heart Rate Range (THRR) Yes Yes Yes     Intervention Provide education and explanation of THRR including how the numbers were predicted and where they are located for reference Provide education and explanation of THRR including how the numbers were predicted and where they are located for reference Provide education and explanation of THRR including how the numbers were predicted and where they are located for reference     Expected Outcomes Short Term: Able to state/look up THRR;Long Term: Able to use THRR to govern intensity when exercising independently;Short Term: Able to use daily as guideline for intensity in rehab Short Term: Able to state/look up THRR;Long Term: Able to  use THRR to govern intensity when exercising independently;Short Term: Able to use daily as guideline for intensity in rehab Short Term: Able to state/look up THRR;Long Term: Able to use THRR to govern intensity when exercising independently;Short Term: Able to use daily as guideline for intensity in rehab     Able to check pulse independently Yes Yes Yes     Intervention Provide education and demonstration on how to check pulse in carotid and radial arteries.;Review the importance of being able to check your own pulse for safety during independent exercise Provide education and demonstration on how to check pulse in carotid and radial arteries.;Review the importance of being able to check your own pulse for safety during independent exercise Provide education and demonstration on how to check pulse in carotid and radial arteries.;Review the importance of being able to check your own pulse for safety during  independent exercise     Expected Outcomes Short Term: Able to explain why pulse checking is important during independent exercise;Long Term: Able to check pulse independently and accurately Short Term: Able to explain why pulse checking is important during independent exercise;Long Term: Able to check pulse independently and accurately Short Term: Able to explain why pulse checking is important during independent exercise;Long Term: Able to check pulse independently and accurately     Understanding of Exercise Prescription Yes Yes Yes     Intervention Provide education, explanation, and written materials on patient's individual exercise prescription Provide education, explanation, and written materials on patient's individual exercise prescription Provide education, explanation, and written materials on patient's individual exercise prescription     Expected Outcomes Short Term: Able to explain program exercise prescription;Long Term: Able to explain home exercise prescription to exercise independently Short Term: Able to explain program exercise prescription;Long Term: Able to explain home exercise prescription to exercise independently Short Term: Able to explain program exercise prescription;Long Term: Able to explain home exercise prescription to exercise independently              Exercise Goals Re-Evaluation :  Exercise Goals Re-Evaluation     Row Name 07/09/21 1437 08/06/21 1331           Exercise Goal Re-Evaluation   Exercise Goals Review Increase Physical Activity;Increase Strength and Stamina;Able to understand and use rate of perceived exertion (RPE) scale;Knowledge and understanding of Target Heart Rate Range (THRR);Able to check pulse independently;Understanding of Exercise Prescription Increase Physical Activity;Increase Strength and Stamina;Able to understand and use rate of perceived exertion (RPE) scale;Able to check pulse independently;Knowledge and understanding of Target  Heart Rate Range (THRR);Understanding of Exercise Prescription      Comments Pt has completed 3 sessions of caridac rehab. He is very motivated to be in class and is eagar to increase his workload. He is currently exercising at 4.8 METs on the ellp. Will continue to monitor and progress as able. Pt has completed 14 sessions of cardiac rehab. He continues to be motivated in class and is pushing himself to increase his workload. He is currently exercising at 6.6 METs on the ellp. Will continue to monitor and progress as able.      Expected Outcomes Through exercise at home and at rehab, the patient will meet their stated goals. Through exercise at home and at rehab, the patient will meet their stated goals.                Discharge Exercise Prescription (Final Exercise Prescription Changes):  Exercise Prescription Changes - 08/06/21 1300  Response to Exercise   Blood Pressure (Admit) 120/68    Blood Pressure (Exercise) 118/60    Blood Pressure (Exit) 100/60    Heart Rate (Admit) 47 bpm    Heart Rate (Exercise) 74 bpm    Heart Rate (Exit) 56 bpm    Rating of Perceived Exertion (Exercise) 12    Duration Continue with 30 min of aerobic exercise without signs/symptoms of physical distress.    Intensity THRR unchanged      Progression   Progression Continue to progress workloads to maintain intensity without signs/symptoms of physical distress.      Resistance Training   Training Prescription Yes    Weight 5    Reps 10-15    Time 10 Minutes      Treadmill   MPH 2.5    Grade 3    Minutes 17    METs 3.95      Recumbant Elliptical   Level 5    RPM 69    Minutes 22    METs 6.6             Nutrition:  Target Goals: Understanding of nutrition guidelines, daily intake of sodium '1500mg'$ , cholesterol '200mg'$ , calories 30% from fat and 7% or less from saturated fats, daily to have 5 or more servings of fruits and vegetables.  Biometrics:  Pre Biometrics - 07/02/21 1322        Pre Biometrics   Height '5\' 7"'$  (1.702 m)    Weight 76.1 kg    Waist Circumference 34 inches    Hip Circumference 36 inches    Waist to Hip Ratio 0.94 %    BMI (Calculated) 26.27    Triceps Skinfold 10 mm    % Body Fat 22.7 %    Grip Strength 27.7 kg    Flexibility 9 in    Single Leg Stand 60 seconds              Nutrition Therapy Plan and Nutrition Goals:  Nutrition Therapy & Goals - 07/02/21 1251       Intervention Plan   Intervention Nutrition handout(s) given to patient.    Expected Outcomes Short Term Goal: Understand basic principles of dietary content, such as calories, fat, sodium, cholesterol and nutrients.             Nutrition Assessments:  Nutrition Assessments - 07/02/21 1251       MEDFICTS Scores   Pre Score 55            MEDIFICTS Score Key: ?70 Need to make dietary changes  40-70 Heart Healthy Diet ? 40 Therapeutic Level Cholesterol Diet   Picture Your Plate Scores: <88 Unhealthy dietary pattern with much room for improvement. 41-50 Dietary pattern unlikely to meet recommendations for good health and room for improvement. 51-60 More healthful dietary pattern, with some room for improvement.  >60 Healthy dietary pattern, although there may be some specific behaviors that could be improved.    Nutrition Goals Re-Evaluation:   Nutrition Goals Discharge (Final Nutrition Goals Re-Evaluation):   Psychosocial: Target Goals: Acknowledge presence or absence of significant depression and/or stress, maximize coping skills, provide positive support system. Participant is able to verbalize types and ability to use techniques and skills needed for reducing stress and depression.  Initial Review & Psychosocial Screening:  Initial Psych Review & Screening - 07/02/21 1311       Initial Review   Current issues with Current Sleep Concerns      Family Dynamics  Good Support System? Yes    Comments His support system is his wife. His  mother and his friends also support him.      Barriers   Psychosocial barriers to participate in program There are no identifiable barriers or psychosocial needs.      Screening Interventions   Interventions Encouraged to exercise    Expected Outcomes Long Term goal: The participant improves quality of Life and PHQ9 Scores as seen by post scores and/or verbalization of changes;Short Term goal: Identification and review with participant of any Quality of Life or Depression concerns found by scoring the questionnaire.             Quality of Life Scores:  Quality of Life - 07/02/21 1323       Quality of Life   Select Quality of Life      Quality of Life Scores   Health/Function Pre 24.6 %    Socioeconomic Pre 25.93 %    Psych/Spiritual Pre 28 %    Family Pre 25.5 %    GLOBAL Pre 25.64 %            Scores of 19 and below usually indicate a poorer quality of life in these areas.  A difference of  2-3 points is a clinically meaningful difference.  A difference of 2-3 points in the total score of the Quality of Life Index has been associated with significant improvement in overall quality of life, self-image, physical symptoms, and general health in studies assessing change in quality of life.  PHQ-9: Review Flowsheet  More data exists      07/02/2021 08/06/2019 02/05/2019 07/03/2018 01/01/2018  Depression screen PHQ 2/9  Decreased Interest 0 0 0 0 0  Down, Depressed, Hopeless 0 0 0 0 0  PHQ - 2 Score 0 0 0 0 0  Altered sleeping 1 - - - -  Tired, decreased energy 1 - - - -  Change in appetite 1 - - - -  Feeling bad or failure about yourself  0 - - - -  Trouble concentrating 1 - - - -  Moving slowly or fidgety/restless 0 - - - -  Suicidal thoughts 0 - - - -  PHQ-9 Score 4 - - - -  Difficult doing work/chores Not difficult at all - - - -   Interpretation of Total Score  Total Score Depression Severity:  1-4 = Minimal depression, 5-9 = Mild depression, 10-14 = Moderate  depression, 15-19 = Moderately severe depression, 20-27 = Severe depression   Psychosocial Evaluation and Intervention:  Psychosocial Evaluation - 07/02/21 1332       Psychosocial Evaluation & Interventions   Interventions Encouraged to exercise with the program and follow exercise prescription;Relaxation education;Stress management education    Comments Pt has no barriers to participating in CR. He has no identifiable psychosocial issues. He does report problems falling asleep, and he has had this problem for some time. He has been wearing melatonin patches for a few weeks now and reports that this has helped some. He scored a 4 on his PHQ-9 and relates this to his recovery from bypass surgery. He reports having a good support system with his wife, his mother, and his friends. He lives a very active lifestyle, as he rides motorcycles and has a horse and two donkeys which he tends to. 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 He reports that his goals while in  the program are to improve his strength and stamina. He is eager to start the program.    Expected Outcomes Pt will continue to have no identifiable psychosocial issues.    Continue Psychosocial Services  No Follow up required             Psychosocial Re-Evaluation:  Psychosocial Re-Evaluation     [place] Name 07/04/21 1233 07/30/21 1405           Psychosocial Re-Evaluation   Current issues with Current Sleep Concerns Current Sleep Concerns      Comments Patient is new to the program starting today. He continues to have no psychosocial barriers identified. He continues to use Melantonin for sleep. We will continue to monitor. Patient has completed 10 sessions. He continues to have no psychosocial barriers identified. He continues to use Melantonin for sleep. He demonstrates an interest in improving his health overall and enjoys coming to the program. We will continue to monitor.       Expected Outcomes Patient will continue to have no psychosocial barriers identified. Patient will continue to have no psychosocial barriers identified.      Interventions Stress management education;Encouraged to attend Cardiac Rehabilitation for the exercise;Relaxation education Stress management education;Encouraged to attend Cardiac Rehabilitation for the exercise;Relaxation education      Continue Psychosocial Services  No Follow up required No Follow up required               Psychosocial Discharge (Final Psychosocial Re-Evaluation):  Psychosocial Re-Evaluation - 07/30/21 1405       Psychosocial Re-Evaluation   Current issues with Current Sleep Concerns    Comments Patient has completed 10 sessions. He continues to have no psychosocial barriers identified. He continues to use Melantonin for sleep. He demonstrates an interest in improving his health overall and enjoys coming to the program. We will continue to monitor.    Expected Outcomes Patient will continue to have no psychosocial barriers identified.    Interventions Stress management education;Encouraged to attend Cardiac Rehabilitation for the exercise;Relaxation education    Continue Psychosocial Services  No Follow up required             Vocational Rehabilitation: Provide vocational rehab assistance to qualifying candidates.   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             Education: Education Goals: Education classes will be provided on a weekly basis, covering required topics. Participant will state understanding/return demonstration of topics presented.  Learning Barriers/Preferences:  Learning Barriers/Preferences - 07/02/21  1313       Learning Barriers/Preferences   Learning Barriers None    Learning Preferences Skilled Demonstration;Audio;Computer/Internet;Group Instruction;Individual Instruction;Pictoral;Verbal Instruction;Video;Written Material             Education Topics: Hypertension, Hypertension Reduction -Define heart disease and high blood pressure. Discus how high blood pressure affects the body and ways to reduce high blood pressure.   Exercise and Your Heart -Discuss why it is important to exercise, the FITT principles of exercise, normal and abnormal responses to exercise, and how to exercise safely. Flowsheet Row CARDIAC REHAB PHASE II EXERCISE from 08/01/2021 in [place]  Date 08/01/21  Educator [place]  Instruction  Review Code 1- Verbalizes Understanding       Angina -Discuss definition of angina, causes of angina, treatment of angina, and how to decrease risk of having angina.   Cardiac Medications -Review what the following cardiac medications are used for, how they affect the body, and side effects that may occur when taking the medications.  Medications include Aspirin, Beta blockers, calcium channel blockers, ACE Inhibitors, angiotensin receptor blockers, diuretics, digoxin, and antihyperlipidemics.   Congestive Heart Failure -Discuss the definition of CHF, how to live with CHF, the signs and symptoms of CHF, and how keep track of weight and sodium intake.   Heart Disease and Intimacy -Discus the effect sexual activity has on the heart, how changes occur during intimacy as we age, and safety during sexual activity.   Smoking Cessation / COPD -Discuss different methods to quit smoking, the health benefits of quitting smoking, and the definition of COPD.   Nutrition I: Fats -Discuss the types of cholesterol, what cholesterol does to the heart, and how cholesterol levels can be controlled.   Nutrition II: Labels -Discuss the different components  of food labels and how to read food label   Heart Parts/Heart Disease and PAD -Discuss the anatomy of the heart, the pathway of blood circulation through the heart, and these are affected by heart disease. Flowsheet Row CARDIAC REHAB PHASE II EXERCISE from 08/01/2021 in [place]  Date 07/04/21  Educator [place]  Instruction Review Code 2- Demonstrated Understanding       Stress I: Signs and Symptoms -Discuss the causes of stress, how stress may lead to anxiety and depression, and ways to limit stress. Flowsheet Row CARDIAC REHAB PHASE II EXERCISE from 08/01/2021 in [place]  Date 07/11/21  Educator [place]  Instruction Review Code 1- Verbalizes Understanding       Stress II: Relaxation -Discuss different types of relaxation techniques to limit stress. Flowsheet Row CARDIAC REHAB PHASE II EXERCISE from 08/01/2021 in [place]  Date 07/18/21  Educator [place]  Instruction Review Code 1- Verbalizes Understanding       Warning Signs of Stroke / TIA -Discuss definition of a stroke, what the signs and symptoms are of a stroke, and how to identify when someone is having stroke.   Knowledge Questionnaire Score:  Knowledge Questionnaire Score - 07/02/21 1314       Knowledge Questionnaire Score   Pre Score 22/24             Core Components/Risk Factors/Patient Goals at Admission:  Personal Goals and Risk Factors at Admission - 07/02/21 1320       Core Components/Risk Factors/Patient Goals on Admission   Hypertension Yes    Intervention Provide education on lifestyle modifcations including regular physical activity/exercise, weight management, moderate sodium restriction and increased consumption of fresh fruit, vegetables, and low fat dairy, alcohol moderation, and smoking cessation.;Monitor prescription use compliance.    Expected Outcomes Short Term: Continued assessment and intervention until BP is < 140/73m HG  in hypertensive participants. < 130/84mHG in hypertensive participants with diabetes, heart failure or chronic kidney disease.;Long Term: Maintenance of blood pressure at goal levels.    Lipids Yes    Intervention Provide education and support for participant on nutrition & aerobic/resistive exercise along with prescribed medications to achieve LDL '70mg'$ , HDL >'40mg'$ .    Expected Outcomes Short Term: Participant states understanding of desired cholesterol values and is compliant with medications prescribed. Participant is following exercise prescription and nutrition guidelines.;Long Term:  Cholesterol controlled with medications as prescribed, with individualized exercise RX and with personalized nutrition plan. Value goals: LDL < '70mg'$ , HDL > 40 mg.    Personal Goal Other Yes    Personal Goal Improve strength and stamina.    Intervention Attend CR three days per week and continue with his home exercise program.    Expected Outcomes Pt will meet his personal goals.             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 He has multiple risk factors for CAD and is participating in the program for risk modification. He started the program today. His personal goals for the program are to improve his strength and stamina. We will continue to monitor his progress as he works towards meeting these goals. Patient has completed 10 sessions. His current weight is 166.8 lbs down 2 lbs from last 30 day review. He is doing well in the program with consistent attendance and progressions. He blood pressure is at goal with a few hypertensive readings. His personal goals for the program continue to be to improve his strength and  stamina. We will continue to monitor his progress as he works towards meeting these goals.      Expected Outcomes Patient will complete the program meeting both personal and program goals. Patient will complete the program meeting both personal and program goals.               Core Components/Risk Factors/Patient Goals at Discharge (Final Review):   Goals and Risk Factor Review - 07/30/21 1406       Core Components/Risk Factors/Patient Goals Review   Personal Goals Review Lipids;Hypertension;Other    Review Patient has completed 10 sessions. His current weight is 166.8 lbs down 2 lbs from last 30 day review. He is doing well in the program with consistent attendance and progressions. He blood pressure is at goal with a few hypertensive readings. His personal goals for the program continue to be to improve his strength and stamina. We will continue to monitor his progress as he works towards meeting these goals.    Expected Outcomes Patient will complete the program meeting both personal and program goals.             ITP Comments:   Comments: ITP REVIEW Pt is making expected progress toward Cardiac Rehab goals after completing 14 sessions. Recommend continued exercise, life style modification, education, and increased stamina and strength.

## 2021-08-08 NOTE — Progress Notes (Signed)
Daily Session Note  Patient Details  Name: Nathaniel Bender MRN: [id number] Date of Birth: January 18, 1951 Referring Provider:   Flowsheet Row CARDIAC REHAB PHASE II ORIENTATION from 07/02/2021 in [place]  Referring Provider Dr. Kipp Brood       Encounter Date: 08/08/2021  Check In:  Session Check In - 08/08/21 1100       Check-In   Supervising physician immediately available to respond to emergencies [facility] MD immediately available    Physician(s) Dr Zandra Abts    Location [facility]    Staff Present Redge Gainer, BS, Exercise Physiologist;Debra Wynetta Emery, RN, Joanette Gula, RN, BSN    Virtual Visit No    Medication changes reported     No    Fall or balance concerns reported    No    Tobacco Cessation No Change    Warm-up and Cool-down Performed as group-led instruction    Resistance Training Performed Yes    VAD Patient? No    PAD/SET Patient? No      Pain Assessment   Currently in Pain? No/denies    Multiple Pain Sites No             Capillary Blood Glucose: No results found for this or any previous visit (from the past 24 hour(s)).    Social History   Tobacco Use  Smoking Status Never  Smokeless Tobacco Former    Goals Met:  Independence with exercise equipment Exercise tolerated well No report of concerns or symptoms today Strength training completed today  Goals Unmet:  Not Applicable  Comments: checkout at 1200.   Dr. Carlyle Dolly is Medical Director for [facility] Cardiac Rehab

## 2021-08-10 ENCOUNTER — Encounter (HOSPITAL_COMMUNITY)
Admission: RE | Admit: 2021-08-10 | Discharge: 2021-08-10 | Disposition: A | Discharge status: Home / Self Care | Payer: Medicare Other | Source: Ambulatory Visit | Attending: Thoracic Surgery (Cardiothoracic Vascular Surgery) | Admitting: Thoracic Surgery (Cardiothoracic Vascular Surgery)

## 2021-08-10 DIAGNOSIS — Z951 Presence of aortocoronary bypass graft: Secondary | ICD-10-CM | POA: Diagnosis not present

## 2021-08-10 DIAGNOSIS — I214 Non-ST elevation (NSTEMI) myocardial infarction: Secondary | ICD-10-CM

## 2021-08-10 NOTE — Progress Notes (Signed)
I have reviewed a Home Exercise Prescription with Nathaniel Bender . Nathaniel Bender is currently exercising at home by walking in the morning about 2 miles a day.  The patient was advised to walk 2 days a week for 30-45 minutes.  Nathaniel Bender and I discussed how to progress their exercise prescription.  The patient stated that their goals were to build his endurance and strength up and stay active.  The patient stated that they understand the exercise prescription.  We reviewed exercise guidelines, target heart rate during exercise, RPE Scale, weather conditions, NTG use, endpoints for exercise, warmup and cool down.  Patient is encouraged to come to me with any questions. I will continue to follow up with the patient to assist them with progression and safety.

## 2021-08-10 NOTE — Progress Notes (Signed)
Daily Session Note  Patient Details  Name: Nathaniel Bender MRN: [id number] Date of Birth: 03-Mar-1950 Referring Provider:   Flowsheet Row CARDIAC REHAB PHASE II ORIENTATION from 07/02/2021 in [place]  Referring Provider Dr. Kipp Brood       Encounter Date: 08/10/2021  Check In:  Session Check In - 08/10/21 1100       Check-In   Supervising physician immediately available to respond to emergencies [facility] immediately available    Physician(s) Dr. Debara Pickett    Location [facility]    Staff Present Redge Gainer, BS, Exercise Physiologist;Debra Wynetta Emery, RN, Bjorn Loser, MS, ACSM-CEP, Exercise Physiologist    Virtual Visit No    Medication changes reported     No    Fall or balance concerns reported    No    Tobacco Cessation No Change    Warm-up and Cool-down Performed as group-led instruction    Resistance Training Performed Yes    VAD Patient? No    PAD/SET Patient? No      Pain Assessment   Currently in Pain? No/denies    Multiple Pain Sites No             Capillary Blood Glucose: No results found for this or any previous visit (from the past 24 hour(s)).    Social History   Tobacco Use  Smoking Status Never  Smokeless Tobacco Former    Goals Met:  Independence with exercise equipment Exercise tolerated well No report of concerns or symptoms today Strength training completed today  Goals Unmet:  Not Applicable  Comments: check out 1200   Dr. Carlyle Dolly is Medical Director for [place]

## 2021-08-13 ENCOUNTER — Encounter (HOSPITAL_COMMUNITY)
Admission: RE | Admit: 2021-08-13 | Discharge: 2021-08-13 | Disposition: A | Discharge status: Home / Self Care | Payer: Medicare Other | Source: Ambulatory Visit | Attending: Thoracic Surgery (Cardiothoracic Vascular Surgery) | Admitting: Thoracic Surgery (Cardiothoracic Vascular Surgery)

## 2021-08-13 DIAGNOSIS — Z951 Presence of aortocoronary bypass graft: Secondary | ICD-10-CM

## 2021-08-13 DIAGNOSIS — I214 Non-ST elevation (NSTEMI) myocardial infarction: Secondary | ICD-10-CM

## 2021-08-13 NOTE — Progress Notes (Signed)
Daily Session Note  Patient Details  Name: Nathaniel Bender MRN: [id number] Date of Birth: 04-Nov-1950 Referring Provider:   Flowsheet Row CARDIAC REHAB PHASE II ORIENTATION from 07/02/2021 in [place]  Referring Provider Dr. Kipp Brood       Encounter Date: 08/13/2021  Check In:  Session Check In - 08/13/21 1100       Check-In   Supervising physician immediately available to respond to emergencies [facility] immediately available    Physician(s) Dr Domenic Polite    Location [facility]    Staff Present Aundra Dubin, RN, Bjorn Loser, MS, ACSM-CEP, Exercise Physiologist;Ivis Nicolson Hassell Done, RN, BSN    Virtual Visit No    Medication changes reported     No    Fall or balance concerns reported    No    Tobacco Cessation No Change    Warm-up and Cool-down Performed as group-led instruction    Resistance Training Performed Yes    VAD Patient? No    PAD/SET Patient? No      Pain Assessment   Currently in Pain? No/denies    Multiple Pain Sites No             Capillary Blood Glucose: No results found for this or any previous visit (from the past 24 hour(s)).    Social History   Tobacco Use  Smoking Status Never  Smokeless Tobacco Former    Goals Met:  Independence with exercise equipment Exercise tolerated well No report of concerns or symptoms today Strength training completed today  Goals Unmet:  Not Applicable  Comments: Checkout at 1200.   Dr. Carlyle Dolly is Medical Director for [facility] Cardiac Rehab

## 2021-08-15 ENCOUNTER — Encounter (HOSPITAL_COMMUNITY)
Admission: RE | Admit: 2021-08-15 | Discharge: 2021-08-15 | Disposition: A | Discharge status: Home / Self Care | Payer: Medicare Other | Source: Ambulatory Visit | Attending: Thoracic Surgery (Cardiothoracic Vascular Surgery) | Admitting: Thoracic Surgery (Cardiothoracic Vascular Surgery)

## 2021-08-15 DIAGNOSIS — Z951 Presence of aortocoronary bypass graft: Secondary | ICD-10-CM | POA: Diagnosis not present

## 2021-08-15 DIAGNOSIS — I214 Non-ST elevation (NSTEMI) myocardial infarction: Secondary | ICD-10-CM

## 2021-08-15 NOTE — Progress Notes (Signed)
Daily Session Note  Patient Details  Name: Kimi Kroft MRN: [id number] Date of Birth: 1950-11-26 Referring Provider:   Flowsheet Row CARDIAC REHAB PHASE II ORIENTATION from 07/02/2021 in [place]  Referring Provider Dr. Kipp Brood       Encounter Date: 08/15/2021  Check In:  Session Check In - 08/15/21 1100       Check-In   Supervising physician immediately available to respond to emergencies [facility] immediately available    Physician(s) Dr. Johnsie Cancel    Location [facility]    Staff Present Geanie Cooley, RN;Daphyne Hassell Done, RN, Bjorn Loser, MS, ACSM-CEP, Exercise Physiologist    Virtual Visit No    Medication changes reported     No    Fall or balance concerns reported    No    Tobacco Cessation No Change    Warm-up and Cool-down Performed as group-led instruction    Resistance Training Performed Yes    VAD Patient? No    PAD/SET Patient? No      Pain Assessment   Currently in Pain? No/denies    Multiple Pain Sites No             Capillary Blood Glucose: No results found for this or any previous visit (from the past 24 hour(s)).    Social History   Tobacco Use  Smoking Status Never  Smokeless Tobacco Former    Goals Met:  Independence with exercise equipment Exercise tolerated well No report of concerns or symptoms today Strength training completed today  Goals Unmet:  Not Applicable  Comments: check out @ 12:00pm   Dr. Carlyle Dolly is Medical Director for [place]

## 2021-08-17 ENCOUNTER — Encounter (HOSPITAL_COMMUNITY)
Admission: RE | Admit: 2021-08-17 | Discharge: 2021-08-17 | Disposition: A | Discharge status: Home / Self Care | Payer: Medicare Other | Source: Ambulatory Visit | Attending: Thoracic Surgery (Cardiothoracic Vascular Surgery) | Admitting: Thoracic Surgery (Cardiothoracic Vascular Surgery)

## 2021-08-17 DIAGNOSIS — Z951 Presence of aortocoronary bypass graft: Secondary | ICD-10-CM

## 2021-08-17 DIAGNOSIS — I214 Non-ST elevation (NSTEMI) myocardial infarction: Secondary | ICD-10-CM

## 2021-08-17 NOTE — Progress Notes (Signed)
Daily Session Note  Patient Details  Name: Nathaniel Bender. MRN: [id number] Date of Birth: Apr 13, 1950 Referring Provider:   Flowsheet Row CARDIAC REHAB PHASE II ORIENTATION from 07/02/2021 in [place]  Referring Provider Dr. Kipp Brood       Encounter Date: 08/17/2021  Check In:  Session Check In - 08/17/21 1100       Check-In   Supervising physician immediately available to respond to emergencies [facility] immediately available    Physician(s) Dr Julieanne Manson    Location [facility]    Staff Present Geniyah Eischeid Hassell Done, RN, Bjorn Loser, MS, ACSM-CEP, Exercise Physiologist    Virtual Visit No    Medication changes reported     No    Fall or balance concerns reported    No    Tobacco Cessation No Change    Warm-up and Cool-down Performed as group-led instruction    Resistance Training Performed Yes    VAD Patient? No    PAD/SET Patient? No      Pain Assessment   Currently in Pain? No/denies    Multiple Pain Sites No             Capillary Blood Glucose: No results found for this or any previous visit (from the past 24 hour(s)).    Social History   Tobacco Use  Smoking Status Never  Smokeless Tobacco Former    Goals Met:  Independence with exercise equipment Exercise tolerated well No report of concerns or symptoms today Strength training completed today  Goals Unmet:  Not Applicable  Comments: Checkout at 1200.   Dr. Carlyle Dolly is Medical Director for [facility] Cardiac Rehab

## 2021-08-20 ENCOUNTER — Encounter (HOSPITAL_COMMUNITY)
Admission: RE | Admit: 2021-08-20 | Discharge: 2021-08-20 | Disposition: A | Discharge status: Home / Self Care | Payer: Medicare Other | Source: Ambulatory Visit | Attending: Thoracic Surgery (Cardiothoracic Vascular Surgery) | Admitting: Thoracic Surgery (Cardiothoracic Vascular Surgery)

## 2021-08-20 ENCOUNTER — Encounter (HOSPITAL_BASED_OUTPATIENT_CLINIC_OR_DEPARTMENT_OTHER): Payer: Self-pay

## 2021-08-20 VITALS — Wt 168.9 lb

## 2021-08-20 DIAGNOSIS — Z951 Presence of aortocoronary bypass graft: Secondary | ICD-10-CM | POA: Diagnosis not present

## 2021-08-20 DIAGNOSIS — I214 Non-ST elevation (NSTEMI) myocardial infarction: Secondary | ICD-10-CM

## 2021-08-20 NOTE — Progress Notes (Addendum)
Daily Session Note  Patient Details  Name: Nathaniel Bender. MRN: [id number] Date of Birth: July 17, 1950 Referring Provider:   Flowsheet Row CARDIAC REHAB PHASE II ORIENTATION from 07/02/2021 in [place]  Referring Provider Dr. Kipp Brood       Encounter Date: 08/20/2021  Check In:  Session Check In - 08/20/21 1100       Check-In   Supervising physician immediately available to respond to emergencies [facility] immediately available    Physician(s) Dr. Harrington Challenger    Location [facility]    Staff Present Geanie Cooley, RN;Heather Otho Ket, BS, Exercise Physiologist;Kayann Maj Wynetta Emery, RN, BSN    Virtual Visit No    Medication changes reported     No    Fall or balance concerns reported    No    Warm-up and Cool-down Performed as group-led Higher education careers adviser Performed No    VAD Patient? No    PAD/SET Patient? No      Pain Assessment   Currently in Pain? No/denies    Multiple Pain Sites No             Capillary Blood Glucose: No results found for this or any previous visit (from the past 24 hour(s)).    Social History   Tobacco Use  Smoking Status Never  Smokeless Tobacco Former    Goals Met:  Independence with exercise equipment Exercise tolerated well No report of concerns or symptoms today Strength training completed today  Goals Unmet:  Not Applicable  Comments: Check out 1200.   Dr. Carlyle Dolly is Medical Director for [facility] Cardiac Rehab

## 2021-08-20 NOTE — Telephone Encounter (Signed)
Please advise 

## 2021-08-22 ENCOUNTER — Encounter (HOSPITAL_COMMUNITY)
Admission: RE | Admit: 2021-08-22 | Discharge: 2021-08-22 | Disposition: A | Discharge status: Home / Self Care | Payer: Medicare Other | Source: Ambulatory Visit | Attending: Thoracic Surgery (Cardiothoracic Vascular Surgery) | Admitting: Thoracic Surgery (Cardiothoracic Vascular Surgery)

## 2021-08-22 DIAGNOSIS — I214 Non-ST elevation (NSTEMI) myocardial infarction: Secondary | ICD-10-CM | POA: Diagnosis present

## 2021-08-22 DIAGNOSIS — Z951 Presence of aortocoronary bypass graft: Secondary | ICD-10-CM | POA: Diagnosis present

## 2021-08-22 NOTE — Progress Notes (Signed)
Daily Session Note  Patient Details  Name: Nathaniel Bender. MRN: [id number] Date of Birth: 1950/11/15 Referring Provider:   Flowsheet Row CARDIAC REHAB PHASE II ORIENTATION from 07/02/2021 in [place]  Referring Provider Dr. Kipp Brood       Encounter Date: 08/22/2021  Check In:  Session Check In - 08/22/21 1100       Check-In   Supervising physician immediately available to respond to emergencies [facility] MD immediately available    Physician(s) Dr. Johney Frame    Location [facility]    Staff Present Hoy Register, MS, ACSM-CEP, Exercise Physiologist;Daphyne Hassell Done, RN, BSN;Heather Otho Ket, BS, Exercise Physiologist    Virtual Visit No    Medication changes reported     No    Fall or balance concerns reported    No    Tobacco Cessation No Change    Warm-up and Cool-down Performed as group-led instruction    Resistance Training Performed Yes    VAD Patient? No    PAD/SET Patient? No      Pain Assessment   Currently in Pain? No/denies    Multiple Pain Sites No             Capillary Blood Glucose: No results found for this or any previous visit (from the past 24 hour(s)).    Social History   Tobacco Use  Smoking Status Never  Smokeless Tobacco Former    Goals Met:  Independence with exercise equipment Exercise tolerated well No report of concerns or symptoms today Strength training completed today  Goals Unmet:  Not Applicable  Comments: checkout time is 1200   Dr. Carlyle Dolly is Medical Director for [place]

## 2021-08-24 ENCOUNTER — Encounter (HOSPITAL_COMMUNITY)
Admission: RE | Admit: 2021-08-24 | Discharge: 2021-08-24 | Disposition: A | Discharge status: Home / Self Care | Payer: Medicare Other | Source: Ambulatory Visit | Attending: Thoracic Surgery (Cardiothoracic Vascular Surgery) | Admitting: Thoracic Surgery (Cardiothoracic Vascular Surgery)

## 2021-08-24 DIAGNOSIS — Z951 Presence of aortocoronary bypass graft: Secondary | ICD-10-CM

## 2021-08-24 DIAGNOSIS — I214 Non-ST elevation (NSTEMI) myocardial infarction: Secondary | ICD-10-CM

## 2021-08-24 NOTE — Progress Notes (Signed)
Daily Session Note  Patient Details  Name: Nathaniel Bender. MRN: [id number] Date of Birth: 10-Aug-1950 Referring Provider:   Flowsheet Row CARDIAC REHAB PHASE II ORIENTATION from 07/02/2021 in [place]  Referring Provider Dr. Kipp Brood       Encounter Date: 08/24/2021  Check In:  Session Check In - 08/24/21 1100       Check-In   Supervising physician immediately available to respond to emergencies [facility] MD immediately available    Physician(s) Dr. Audie Box    Location [facility]    Staff Present Hoy Register, MS, ACSM-CEP, Exercise Physiologist;Daphyne Hassell Done, RN, BSN;Heather Otho Ket, BS, Exercise Physiologist;Neeti Knudtson, RN    Virtual Visit No    Medication changes reported     No    Fall or balance concerns reported    No    Tobacco Cessation No Change    Warm-up and Cool-down Performed as group-led instruction    Resistance Training Performed Yes    VAD Patient? No    PAD/SET Patient? No      Pain Assessment   Currently in Pain? No/denies    Multiple Pain Sites No             Capillary Blood Glucose: No results found for this or any previous visit (from the past 24 hour(s)).    Social History   Tobacco Use  Smoking Status Never  Smokeless Tobacco Former    Goals Met:  Independence with exercise equipment Exercise tolerated well No report of concerns or symptoms today Strength training completed today  Goals Unmet:  Not Applicable  Comments: checkout @ 12:00   Dr. Carlyle Dolly is Medical Director for [place]

## 2021-08-27 ENCOUNTER — Encounter (HOSPITAL_COMMUNITY): Payer: Medicare Other

## 2021-08-29 ENCOUNTER — Encounter (HOSPITAL_COMMUNITY): Payer: Medicare Other

## 2021-08-31 ENCOUNTER — Encounter (HOSPITAL_COMMUNITY): Payer: Medicare Other

## 2021-09-03 ENCOUNTER — Encounter (HOSPITAL_COMMUNITY): Payer: Medicare Other

## 2021-09-05 ENCOUNTER — Encounter (HOSPITAL_COMMUNITY): Payer: Medicare Other

## 2021-09-05 ENCOUNTER — Ambulatory Visit (HOSPITAL_BASED_OUTPATIENT_CLINIC_OR_DEPARTMENT_OTHER): Payer: Medicare Other | Admitting: Cardiology

## 2021-09-05 NOTE — Progress Notes (Signed)
Cardiac Individual Treatment Plan  Patient Details  Name: Nathaniel Bender. MRN: [id number] Date of Birth: [birth year] Referring Provider:   Flowsheet Row CARDIAC REHAB PHASE II ORIENTATION from 07/02/2021 in [place]  Referring Provider Dr. Kipp Brood       Initial Encounter Date:  Flowsheet Row CARDIAC REHAB PHASE II ORIENTATION from 07/02/2021 in [place]  Date 07/02/21       Visit Diagnosis: S/P CABG x 4  NSTEMI (non-ST elevated myocardial infarction) ([place])  Patient's Home Medications on Admission:  Current Outpatient Medications:    acetaminophen (TYLENOL) 500 MG tablet, Take 1,000 mg by mouth every 6 (six) hours as needed (for pain.)., Disp: , Rfl:    amLODipine (NORVASC) 5 MG tablet, Take 1 tablet (5 mg total) by mouth daily., Disp: 30 tablet, Rfl: 0   aspirin EC 325 MG EC tablet, Take 1 tablet (325 mg total) by mouth daily., Disp: , Rfl:    atorvastatin (LIPITOR) 80 MG tablet, Take 1 tablet (80 mg total) by mouth daily., Disp: 90 tablet, Rfl: 3   ezetimibe (ZETIA) 10 MG tablet, Take 1 tablet (10 mg total) by mouth daily. (Patient taking differently: Take 10 mg by mouth every evening.), Disp: 90 tablet, Rfl: 3   furosemide (LASIX) 20 MG tablet, Take 1 tablet (20 mg total) by mouth as needed. (Patient not taking: Reported on 06/26/2021), Disp: 10 tablet, Rfl: 0   losartan (COZAAR) 50 MG tablet, Take 50 mg by mouth daily., Disp: , Rfl:    Metoprolol Tartrate 37.5 MG TABS, Take 37.5 mg by mouth 2 (two) times daily., Disp: 180 tablet, Rfl: 1   naproxen sodium (ALEVE) 220 MG tablet, Take 220 mg by mouth daily as needed (pain.)., Disp: , Rfl:    omeprazole (PRILOSEC) 20 MG capsule, Take 1 capsule (20 mg total) by mouth daily., Disp: 90 capsule, Rfl: 0   OVER THE COUNTER MEDICATION, Place 1 patch onto the skin at bedtime. Zleep Sleep Patches with Dream Complex (Melatonin, Magnesium, Potassium, 5-HTP, and Zinc), Disp: , Rfl:    Turmeric  500 MG TABS, Take 500 mg by mouth daily., Disp: , Rfl:   Past Medical History: Past Medical History:  Diagnosis Date   Arthritis    Degenerative Disc Disease Cervical; s/p injection [facility].   Atypical mole 05/04/2013   Right Wrist-Severe   Cancer ([place]) 07/21/2013   Skin cancer unknown type   Colon polyps    GERD (gastroesophageal reflux disease)    Gout    Hyperlipidemia    Hypertension    Meniere's disease of left ear    s/p ENT evaluation; acute hearing loss; chronic tinnitus L now.  Recovered hearing completely.   SCCA (squamous cell carcinoma) of skin 08/08/2014   Left Forehead-Well Diff (curet and 5FU)    Tobacco Use: Social History   Tobacco Use  Smoking Status Never  Smokeless Tobacco Former    Labs: Review Flowsheet  More data exists      Latest Ref Rng & Units 07/30/2019 05/06/2021 05/07/2021 05/08/2021 06/15/2021  Labs for ITP Cardiac and Pulmonary Rehab  Cholestrol 100 - 199 mg/dL 172  178  - - 155   LDL (calc) 0 - 99 mg/dL 96  95  - - 83   HDL-C >39 mg/dL 44  53  - - 49   Trlycerides 0 - 149 mg/dL 183  148  - - 128   Hemoglobin A1c 4.8 - 5.6 % 5.9  - - 6.1  -  PH, Arterial 7.35 - 7.45 - - 7.44  7.355  7.369  7.377  7.405  7.461  7.441  7.372  -  PCO2 arterial 32 - 48 mmHg - - 35  38.2  37.4  38.3  35.4  34.8  38.5  42.5  -  Bicarbonate 20.0 - 28.0 mmol/L - - 23.8  21.5  21.7  22.7  22.2  24.8  33.9  26.2  24.6  -  TCO2 22 - 32 mmol/L - - - '23  23  24  23  23  26  26  26  '$ 35  '27  25  26  29  '$ -  Acid-base deficit 0.0 - 2.0 mmol/L - - - 4.0  3.0  2.0  2.0  1.0  -  O2 Saturation % - - 93.2  97  95  97  97  100  77  100  100  -    Capillary Blood Glucose: Lab Results  Component Value Date   GLUCAP 121 (H) 05/12/2021   GLUCAP 81 05/12/2021   GLUCAP 164 (H) 05/11/2021   GLUCAP 98 05/11/2021   GLUCAP 171 (H) 05/11/2021     Exercise Target Goals: Exercise Program Goal: Individual exercise prescription set using results from initial 6 min walk test  and THRR while considering  patient's activity barriers and safety.   Exercise Prescription Goal: Starting with aerobic activity 30 plus minutes a day, 3 days per week for initial exercise prescription. Provide home exercise prescription and guidelines that participant acknowledges understanding prior to discharge.  Activity Barriers & Risk Stratification:  Activity Barriers & Cardiac Risk Stratification - 07/02/21 1241       Activity Barriers & Cardiac Risk Stratification   Activity Barriers Joint Problems    Cardiac Risk Stratification High             6 Minute Walk:  6 Minute Walk     Row Name 07/02/21 1319         6 Minute Walk   Phase Initial     Distance 1600 feet     Walk Time 6 minutes     # of Rest Breaks 0     MPH 3.03     METS 3.05     RPE 11     VO2 Peak 10.69     Symptoms No     Resting HR 42 bpm     Resting BP 98/64     Resting Oxygen Saturation  97 %     Exercise Oxygen Saturation  during 6 min walk 97 %     Max Ex. HR 54 bpm     Max Ex. BP 122/60     2 Minute Post BP 110/62              Oxygen Initial Assessment:   Oxygen Re-Evaluation:   Oxygen Discharge (Final Oxygen Re-Evaluation):   Initial Exercise Prescription:  Initial Exercise Prescription - 07/02/21 1300       Date of Initial Exercise RX and Referring Provider   Date 07/02/21    Referring Provider Dr. Kipp Brood    Expected Discharge Date 09/24/21      Treadmill   MPH 1.5    Grade 0    Minutes 22      Recumbant Elliptical   Level 1    RPM 60    Minutes 17      Prescription Details   Frequency (times per week) 3    Duration  Progress to 30 minutes of continuous aerobic without signs/symptoms of physical distress      Intensity   THRR 40-80% of Max Heartrate 60-121    Ratings of Perceived Exertion 11-13      Resistance Training   Training Prescription Yes    Weight 4    Reps 10-15             Perform Capillary Blood Glucose checks as  needed.  Exercise Prescription Changes:   Exercise Prescription Changes     Row Name 07/09/21 1400 07/23/21 1200 08/06/21 1300 08/10/21 1100 08/20/21 1200     Response to Exercise   Blood Pressure (Admit) 128/66 130/70 120/68 -- 108/60   Blood Pressure (Exercise) 138/60 150/62 118/60 -- 140/62   Blood Pressure (Exit) 110/66 124/62 100/60 -- 110/60   Heart Rate (Admit) 45 bpm 46 bpm 47 bpm -- 42 bpm   Heart Rate (Exercise) 71 bpm 81 bpm 74 bpm -- 73 bpm   Heart Rate (Exit) 53 bpm 55 bpm 56 bpm -- 51 bpm   Rating of Perceived Exertion (Exercise) '12 12 12 '$ -- 12   Duration Continue with 30 min of aerobic exercise without signs/symptoms of physical distress. Continue with 30 min of aerobic exercise without signs/symptoms of physical distress. Continue with 30 min of aerobic exercise without signs/symptoms of physical distress. -- Continue with 30 min of aerobic exercise without signs/symptoms of physical distress.   Intensity THRR unchanged THRR unchanged THRR unchanged -- THRR unchanged     Progression   Progression Continue to progress workloads to maintain intensity without signs/symptoms of physical distress. Continue to progress workloads to maintain intensity without signs/symptoms of physical distress. Continue to progress workloads to maintain intensity without signs/symptoms of physical distress. -- Continue to progress workloads to maintain intensity without signs/symptoms of physical distress.     Resistance Training   Training Prescription Yes Yes Yes -- Yes   Weight '5 5 5 '$ -- 8   Reps 10-15 10-15 10-15 -- 10-15   Time 10 Minutes 10 Minutes 10 Minutes -- 10 Minutes     Treadmill   MPH 2.6 2.6 2.5 -- 2.6   Grade 0 1 3 -- 5   Minutes '17 17 17 '$ -- 17   METs 2.99 3.35 3.95 -- 4.78     Recumbant Elliptical   Level '1 3 5 '$ -- 5   RPM 61 69 69 -- 66   Minutes '22 22 22 '$ -- 22   METs 4.8 5.9 6.6 -- 5.6     Home Exercise Plan   Plans to continue exercise at -- -- -- Home (comment) --    Frequency -- -- -- Add 2 additional days to program exercise sessions. --   Initial Home Exercises Provided -- -- -- 08/10/21 --    [place] Name 08/24/21 1200             Response to Exercise   Blood Pressure (Admit) 132/64       Blood Pressure (Exercise) 132/62       Blood Pressure (Exit) 110/66       Heart Rate (Admit) 44 bpm       Heart Rate (Exercise) 77 bpm       Heart Rate (Exit) 53 bpm       Rating of Perceived Exertion (Exercise) 12       Duration Continue with 30 min of aerobic exercise without signs/symptoms of physical distress.       Intensity THRR unchanged  Progression   Progression Continue to progress workloads to maintain intensity without signs/symptoms of physical distress.         Resistance Training   Training Prescription Yes       Weight 5       Reps 10-15       Time 10 Minutes         Treadmill   MPH 2.6       Grade 5       Minutes 17       METs 4.78         Recumbant Elliptical   Level 6       RPM 70       Minutes 22       METs 6.7                Exercise Comments:   Exercise Comments     Row Name 08/10/21 1142           Exercise Comments home exercise reviewed                Exercise Goals and Review:   Exercise Goals     Row Name 07/02/21 1321 07/09/21 1436 08/06/21 1339 09/04/21 0832       Exercise Goals   Increase Physical Activity Yes Yes Yes Yes    Intervention Provide advice, education, support and counseling about physical activity/exercise needs.;Develop an individualized exercise prescription for aerobic and resistive training based on initial evaluation findings, risk stratification, comorbidities and participant's personal goals. Provide advice, education, support and counseling about physical activity/exercise needs.;Develop an individualized exercise prescription for aerobic and resistive training based on initial evaluation findings, risk stratification, comorbidities and participant's personal  goals. Provide advice, education, support and counseling about physical activity/exercise needs.;Develop an individualized exercise prescription for aerobic and resistive training based on initial evaluation findings, risk stratification, comorbidities and participant's personal goals. Provide advice, education, support and counseling about physical activity/exercise needs.;Develop an individualized exercise prescription for aerobic and resistive training based on initial evaluation findings, risk stratification, comorbidities and participant's personal goals.    Expected Outcomes Short Term: Attend rehab on a regular basis to increase amount of physical activity.;Long Term: Add in home exercise to make exercise part of routine and to increase amount of physical activity.;Long Term: Exercising regularly at least 3-5 days a week. Short Term: Attend rehab on a regular basis to increase amount of physical activity.;Long Term: Add in home exercise to make exercise part of routine and to increase amount of physical activity.;Long Term: Exercising regularly at least 3-5 days a week. Short Term: Attend rehab on a regular basis to increase amount of physical activity.;Long Term: Add in home exercise to make exercise part of routine and to increase amount of physical activity.;Long Term: Exercising regularly at least 3-5 days a week. Short Term: Attend rehab on a regular basis to increase amount of physical activity.;Long Term: Add in home exercise to make exercise part of routine and to increase amount of physical activity.;Long Term: Exercising regularly at least 3-5 days a week.    Increase Strength and Stamina Yes Yes Yes Yes    Intervention Provide advice, education, support and counseling about physical activity/exercise needs.;Develop an individualized exercise prescription for aerobic and resistive training based on initial evaluation findings, risk stratification, comorbidities and participant's personal goals.  Provide advice, education, support and counseling about physical activity/exercise needs.;Develop an individualized exercise prescription for aerobic and resistive training based on initial evaluation findings, risk stratification,  comorbidities and participant's personal goals. Provide advice, education, support and counseling about physical activity/exercise needs.;Develop an individualized exercise prescription for aerobic and resistive training based on initial evaluation findings, risk stratification, comorbidities and participant's personal goals. Provide advice, education, support and counseling about physical activity/exercise needs.;Develop an individualized exercise prescription for aerobic and resistive training based on initial evaluation findings, risk stratification, comorbidities and participant's personal goals.    Expected Outcomes Short Term: Increase workloads from initial exercise prescription for resistance, speed, and METs.;Short Term: Perform resistance training exercises routinely during rehab and add in resistance training at home;Long Term: Improve cardiorespiratory fitness, muscular endurance and strength as measured by increased METs and functional capacity (6MWT) Short Term: Increase workloads from initial exercise prescription for resistance, speed, and METs.;Short Term: Perform resistance training exercises routinely during rehab and add in resistance training at home;Long Term: Improve cardiorespiratory fitness, muscular endurance and strength as measured by increased METs and functional capacity (6MWT) Short Term: Increase workloads from initial exercise prescription for resistance, speed, and METs.;Short Term: Perform resistance training exercises routinely during rehab and add in resistance training at home;Long Term: Improve cardiorespiratory fitness, muscular endurance and strength as measured by increased METs and functional capacity (6MWT) Short Term: Increase workloads from  initial exercise prescription for resistance, speed, and METs.;Short Term: Perform resistance training exercises routinely during rehab and add in resistance training at home;Long Term: Improve cardiorespiratory fitness, muscular endurance and strength as measured by increased METs and functional capacity (6MWT)    Able to understand and use rate of perceived exertion (RPE) scale Yes Yes Yes Yes    Intervention Provide education and explanation on how to use RPE scale Provide education and explanation on how to use RPE scale Provide education and explanation on how to use RPE scale Provide education and explanation on how to use RPE scale    Expected Outcomes Short Term: Able to use RPE daily in rehab to express subjective intensity level;Long Term:  Able to use RPE to guide intensity level when exercising independently Short Term: Able to use RPE daily in rehab to express subjective intensity level;Long Term:  Able to use RPE to guide intensity level when exercising independently Short Term: Able to use RPE daily in rehab to express subjective intensity level;Long Term:  Able to use RPE to guide intensity level when exercising independently Short Term: Able to use RPE daily in rehab to express subjective intensity level;Long Term:  Able to use RPE to guide intensity level when exercising independently    Knowledge and understanding of Target Heart Rate Range (THRR) Yes Yes Yes Yes    Intervention Provide education and explanation of THRR including how the numbers were predicted and where they are located for reference Provide education and explanation of THRR including how the numbers were predicted and where they are located for reference Provide education and explanation of THRR including how the numbers were predicted and where they are located for reference Provide education and explanation of THRR including how the numbers were predicted and where they are located for reference    Expected Outcomes Short  Term: Able to state/look up THRR;Long Term: Able to use THRR to govern intensity when exercising independently;Short Term: Able to use daily as guideline for intensity in rehab Short Term: Able to state/look up THRR;Long Term: Able to use THRR to govern intensity when exercising independently;Short Term: Able to use daily as guideline for intensity in rehab Short Term: Able to state/look up THRR;Long Term: Able to use THRR  to govern intensity when exercising independently;Short Term: Able to use daily as guideline for intensity in rehab Short Term: Able to state/look up THRR;Long Term: Able to use THRR to govern intensity when exercising independently;Short Term: Able to use daily as guideline for intensity in rehab    Able to check pulse independently Yes Yes Yes Yes    Intervention Provide education and demonstration on how to check pulse in carotid and radial arteries.;Review the importance of being able to check your own pulse for safety during independent exercise Provide education and demonstration on how to check pulse in carotid and radial arteries.;Review the importance of being able to check your own pulse for safety during independent exercise Provide education and demonstration on how to check pulse in carotid and radial arteries.;Review the importance of being able to check your own pulse for safety during independent exercise Provide education and demonstration on how to check pulse in carotid and radial arteries.;Review the importance of being able to check your own pulse for safety during independent exercise    Expected Outcomes Short Term: Able to explain why pulse checking is important during independent exercise;Long Term: Able to check pulse independently and accurately Short Term: Able to explain why pulse checking is important during independent exercise;Long Term: Able to check pulse independently and accurately Short Term: Able to explain why pulse checking is important during independent  exercise;Long Term: Able to check pulse independently and accurately Short Term: Able to explain why pulse checking is important during independent exercise;Long Term: Able to check pulse independently and accurately    Understanding of Exercise Prescription Yes Yes Yes Yes    Intervention Provide education, explanation, and written materials on patient's individual exercise prescription Provide education, explanation, and written materials on patient's individual exercise prescription Provide education, explanation, and written materials on patient's individual exercise prescription Provide education, explanation, and written materials on patient's individual exercise prescription    Expected Outcomes Short Term: Able to explain program exercise prescription;Long Term: Able to explain home exercise prescription to exercise independently Short Term: Able to explain program exercise prescription;Long Term: Able to explain home exercise prescription to exercise independently Short Term: Able to explain program exercise prescription;Long Term: Able to explain home exercise prescription to exercise independently Short Term: Able to explain program exercise prescription;Long Term: Able to explain home exercise prescription to exercise independently             Exercise Goals Re-Evaluation :  Exercise Goals Re-Evaluation     Row Name 07/09/21 1437 08/06/21 1331 09/04/21 0833         Exercise Goal Re-Evaluation   Exercise Goals Review Increase Physical Activity;Increase Strength and Stamina;Able to understand and use rate of perceived exertion (RPE) scale;Knowledge and understanding of Target Heart Rate Range (THRR);Able to check pulse independently;Understanding of Exercise Prescription Increase Physical Activity;Increase Strength and Stamina;Able to understand and use rate of perceived exertion (RPE) scale;Able to check pulse independently;Knowledge and understanding of Target Heart Rate Range  (THRR);Understanding of Exercise Prescription Increase Physical Activity;Increase Strength and Stamina;Able to understand and use rate of perceived exertion (RPE) scale;Knowledge and understanding of Target Heart Rate Range (THRR);Able to check pulse independently;Understanding of Exercise Prescription     Comments Pt has completed 3 sessions of caridac rehab. He is very motivated to be in class and is eagar to increase his workload. He is currently exercising at 4.8 METs on the ellp. Will continue to monitor and progress as able. Pt has completed 14 sessions of cardiac rehab.  He continues to be motivated in class and is pushing himself to increase his workload. He is currently exercising at 6.6 METs on the ellp. Will continue to monitor and progress as able. Pt has completed 22 sesions of cardiac rehab. He continues to increase his workloads and push himself during classes. He is currently exercising at 6.7 METs on the ellp. Will continue to monitor and progress as able.     Expected Outcomes Through exercise at home and at rehab, the patient will meet their stated goals. Through exercise at home and at rehab, the patient will meet their stated goals. Through exercise at home and at rehab, the patient will meet their stated goals.               Discharge Exercise Prescription (Final Exercise Prescription Changes):  Exercise Prescription Changes - 08/24/21 1200       Response to Exercise   Blood Pressure (Admit) 132/64    Blood Pressure (Exercise) 132/62    Blood Pressure (Exit) 110/66    Heart Rate (Admit) 44 bpm    Heart Rate (Exercise) 77 bpm    Heart Rate (Exit) 53 bpm    Rating of Perceived Exertion (Exercise) 12    Duration Continue with 30 min of aerobic exercise without signs/symptoms of physical distress.    Intensity THRR unchanged      Progression   Progression Continue to progress workloads to maintain intensity without signs/symptoms of physical distress.      Resistance  Training   Training Prescription Yes    Weight 5    Reps 10-15    Time 10 Minutes      Treadmill   MPH 2.6    Grade 5    Minutes 17    METs 4.78      Recumbant Elliptical   Level 6    RPM 70    Minutes 22    METs 6.7             Nutrition:  Target Goals: Understanding of nutrition guidelines, daily intake of sodium '1500mg'$ , cholesterol '200mg'$ , calories 30% from fat and 7% or less from saturated fats, daily to have 5 or more servings of fruits and vegetables.  Biometrics:  Pre Biometrics - 07/02/21 1322       Pre Biometrics   Height '5\' 7"'$  (1.702 m)    Weight 76.1 kg    Waist Circumference 34 inches    Hip Circumference 36 inches    Waist to Hip Ratio 0.94 %    BMI (Calculated) 26.27    Triceps Skinfold 10 mm    % Body Fat 22.7 %    Grip Strength 27.7 kg    Flexibility 9 in    Single Leg Stand 60 seconds              Nutrition Therapy Plan and Nutrition Goals:  Nutrition Therapy & Goals - 07/02/21 1251       Intervention Plan   Intervention Nutrition handout(s) given to patient.    Expected Outcomes Short Term Goal: Understand basic principles of dietary content, such as calories, fat, sodium, cholesterol and nutrients.             Nutrition Assessments:  Nutrition Assessments - 07/02/21 1251       MEDFICTS Scores   Pre Score 55            MEDIFICTS Score Key: ?70 Need to make dietary changes  40-70 Heart Healthy Diet ? 40 Therapeutic  Level Cholesterol Diet   Picture Your Plate Scores: <09 Unhealthy dietary pattern with much room for improvement. 41-50 Dietary pattern unlikely to meet recommendations for good health and room for improvement. 51-60 More healthful dietary pattern, with some room for improvement.  >60 Healthy dietary pattern, although there may be some specific behaviors that could be improved.    Nutrition Goals Re-Evaluation:   Nutrition Goals Discharge (Final Nutrition Goals  Re-Evaluation):   Psychosocial: Target Goals: Acknowledge presence or absence of significant depression and/or stress, maximize coping skills, provide positive support system. Participant is able to verbalize types and ability to use techniques and skills needed for reducing stress and depression.  Initial Review & Psychosocial Screening:  Initial Psych Review & Screening - 07/02/21 1311       Initial Review   Current issues with Current Sleep Concerns      Family Dynamics   Good Support System? Yes    Comments His support system is his wife. His mother and his friends also support him.      Barriers   Psychosocial barriers to participate in program There are no identifiable barriers or psychosocial needs.      Screening Interventions   Interventions Encouraged to exercise    Expected Outcomes Long Term goal: The participant improves quality of Life and PHQ9 Scores as seen by post scores and/or verbalization of changes;Short Term goal: Identification and review with participant of any Quality of Life or Depression concerns found by scoring the questionnaire.             Quality of Life Scores:  Quality of Life - 07/02/21 1323       Quality of Life   Select Quality of Life      Quality of Life Scores   Health/Function Pre 24.6 %    Socioeconomic Pre 25.93 %    Psych/Spiritual Pre 28 %    Family Pre 25.5 %    GLOBAL Pre 25.64 %            Scores of 19 and below usually indicate a poorer quality of life in these areas.  A difference of  2-3 points is a clinically meaningful difference.  A difference of 2-3 points in the total score of the Quality of Life Index has been associated with significant improvement in overall quality of life, self-image, physical symptoms, and general health in studies assessing change in quality of life.  PHQ-9: Review Flowsheet  More data exists      07/02/2021 08/06/2019 02/05/2019 07/03/2018 01/01/2018  Depression screen PHQ 2/9   Decreased Interest 0 0 0 0 0  Down, Depressed, Hopeless 0 0 0 0 0  PHQ - 2 Score 0 0 0 0 0  Altered sleeping 1 - - - -  Tired, decreased energy 1 - - - -  Change in appetite 1 - - - -  Feeling bad or failure about yourself  0 - - - -  Trouble concentrating 1 - - - -  Moving slowly or fidgety/restless 0 - - - -  Suicidal thoughts 0 - - - -  PHQ-9 Score 4 - - - -  Difficult doing work/chores Not difficult at all - - - -   Interpretation of Total Score  Total Score Depression Severity:  1-4 = Minimal depression, 5-9 = Mild depression, 10-14 = Moderate depression, 15-19 = Moderately severe depression, 20-27 = Severe depression   Psychosocial Evaluation and Intervention:  Psychosocial Evaluation - 07/02/21 1332  Psychosocial Evaluation & Interventions   Interventions Encouraged to exercise with the program and follow exercise prescription;Relaxation education;Stress management education    Comments Pt has no barriers to participating in CR. He has no identifiable psychosocial issues. He does report problems falling asleep, and he has had this problem for some time. He has been wearing melatonin patches for a few weeks now and reports that this has helped some. He scored a 4 on his PHQ-9 and relates this to his recovery from bypass surgery. He reports having a good support system with his wife, his mother, and his friends. He lives a very active lifestyle, as he rides motorcycles and has a horse and two donkeys which he tends to. 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 He reports that his goals while in the program are to improve his strength and stamina. He is eager to start the program.    Expected Outcomes Pt will continue to have no identifiable psychosocial issues.    Continue Psychosocial Services  No Follow up required             Psychosocial Re-Evaluation:  Psychosocial Re-Evaluation     [place] Name 07/04/21 1233  07/30/21 1405 08/27/21 1111         Psychosocial Re-Evaluation   Current issues with Current Sleep Concerns Current Sleep Concerns Current Sleep Concerns     Comments Patient is new to the program starting today. He continues to have no psychosocial barriers identified. He continues to use Melantonin for sleep. We will continue to monitor. Patient has completed 10 sessions. He continues to have no psychosocial barriers identified. He continues to use Melantonin for sleep. He demonstrates an interest in improving his health overall and enjoys coming to the program. We will continue to monitor. Patient has completed 22 sessions. He continues to have no psychosocial barriers identified. He continues to use Melantonin for sleep. He demonstrates an interest in improving his health overall and enjoys coming to the program. We will continue to monitor.     Expected Outcomes Patient will continue to have no psychosocial barriers identified. Patient will continue to have no psychosocial barriers identified. Patient will continue to have no psychosocial barriers identified.     Interventions Stress management education;Encouraged to attend Cardiac Rehabilitation for the exercise;Relaxation education Stress management education;Encouraged to attend Cardiac Rehabilitation for the exercise;Relaxation education Stress management education;Encouraged to attend Cardiac Rehabilitation for the exercise;Relaxation education     Continue Psychosocial Services  No Follow up required No Follow up required No Follow up required              Psychosocial Discharge (Final Psychosocial Re-Evaluation):  Psychosocial Re-Evaluation - 08/27/21 1111       Psychosocial Re-Evaluation   Current issues with Current Sleep Concerns    Comments Patient has completed 22 sessions. He continues to have no psychosocial barriers identified. He continues to use Melantonin for sleep. He demonstrates an interest in improving his health  overall and enjoys coming to the program. We will continue to monitor.    Expected Outcomes Patient will continue to have no psychosocial barriers identified.    Interventions Stress management education;Encouraged to attend Cardiac Rehabilitation for the exercise;Relaxation education    Continue Psychosocial Services  No Follow up required             Vocational Rehabilitation: Provide vocational rehab assistance to qualifying candidates.   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             Education: Education Goals: Education classes will be provided on a weekly basis, covering required topics. Participant will state understanding/return demonstration of topics presented.  Learning Barriers/Preferences:  Learning Barriers/Preferences - 07/02/21 1313       Learning Barriers/Preferences   Learning Barriers None    Learning Preferences Skilled Demonstration;Audio;Computer/Internet;Group Instruction;Individual Instruction;Pictoral;Verbal Instruction;Video;Written Material             Education Topics: Hypertension, Hypertension Reduction -Define heart disease and high blood pressure. Discus how high blood pressure affects the body and ways to reduce high blood pressure.   Exercise and Your Heart -Discuss why it is important to exercise, the FITT principles of exercise, normal and abnormal responses to exercise, and how to exercise safely. Flowsheet Row CARDIAC REHAB PHASE II EXERCISE from 08/22/2021 in [place]  Date 08/01/21  Educator [place]  Instruction Review Code 1- Verbalizes Understanding       Angina -Discuss definition of angina, causes of angina, treatment of  angina, and how to decrease risk of having angina. Flowsheet Row CARDIAC REHAB PHASE II EXERCISE from 08/22/2021 in [place]  Date 08/08/21  Educator [place]  Instruction Review Code 1- Verbalizes Understanding       Cardiac Medications -Review what the following cardiac medications are used for, how they affect the body, and side effects that may occur when taking the medications.  Medications include Aspirin, Beta blockers, calcium channel blockers, ACE Inhibitors, angiotensin receptor blockers, diuretics, digoxin, and antihyperlipidemics. Flowsheet Row CARDIAC REHAB PHASE II EXERCISE from 08/22/2021 in [place]  Date 08/15/21  Educator pb  Instruction Review Code 1- Verbalizes Understanding       Congestive Heart Failure -Discuss the definition of CHF, how to live with CHF, the signs and symptoms of CHF, and how keep track of weight and sodium intake. Flowsheet Row CARDIAC REHAB PHASE II EXERCISE from 08/22/2021 in [place]  Date 08/22/21  Educator DF  Instruction Review Code 1- Verbalizes Understanding       Heart Disease and Intimacy -Discus the effect sexual activity has on the heart, how changes occur during intimacy as we age, and safety during sexual activity.   Smoking Cessation / COPD -Discuss different methods to quit smoking, the health benefits of quitting smoking, and the definition of COPD.   Nutrition I: Fats -Discuss the types of cholesterol, what cholesterol does to the heart, and how cholesterol levels can be controlled.   Nutrition II: Labels -Discuss the different components of food labels and how to read food label   Heart Parts/Heart Disease and PAD -Discuss the anatomy of the heart, the pathway of blood circulation through the heart, and these are affected by heart disease. Flowsheet Row CARDIAC REHAB PHASE II EXERCISE from 08/22/2021 in [place]  Date  07/04/21  Educator hj  Instruction Review Code 2- Demonstrated Understanding       Stress I: Signs and Symptoms -Discuss the causes of stress, how stress may lead to anxiety and depression, and ways to limit stress. Flowsheet Row CARDIAC REHAB PHASE II EXERCISE from 08/22/2021 in [place]  CARDIAC REHABILITATION  Date 07/11/21  Educator HJ  Instruction Review Code 1- Verbalizes Understanding       Stress II: Relaxation -Discuss different types of relaxation techniques to limit stress. Flowsheet Row CARDIAC REHAB PHASE II EXERCISE from 08/22/2021 in [place]  Date 07/18/21  Educator [place]  Instruction Review Code 1- Verbalizes Understanding       Warning Signs of Stroke / TIA -Discuss definition of a stroke, what the signs and symptoms are of a stroke, and how to identify when someone is having stroke.   Knowledge Questionnaire Score:  Knowledge Questionnaire Score - 07/02/21 1314       Knowledge Questionnaire Score   Pre Score 22/24             Core Components/Risk Factors/Patient Goals at Admission:  Personal Goals and Risk Factors at Admission - 07/02/21 1320       Core Components/Risk Factors/Patient Goals on Admission   Hypertension Yes    Intervention Provide education on lifestyle modifcations including regular physical activity/exercise, weight management, moderate sodium restriction and increased consumption of fresh fruit, vegetables, and low fat dairy, alcohol moderation, and smoking cessation.;Monitor prescription use compliance.    Expected Outcomes Short Term: Continued assessment and intervention until BP is < 140/11m HG in hypertensive participants. < 130/850mHG in hypertensive participants with diabetes, heart failure or chronic kidney disease.;Long Term: Maintenance of blood pressure at goal levels.    Lipids Yes    Intervention Provide education and support for participant on nutrition & aerobic/resistive exercise along with  prescribed medications to achieve LDL '70mg'$ , HDL >'40mg'$ .    Expected Outcomes Short Term: Participant states understanding of desired cholesterol values and is compliant with medications prescribed. Participant is following exercise prescription and nutrition guidelines.;Long Term: Cholesterol controlled with medications as prescribed, with individualized exercise RX and with personalized nutrition plan. Value goals: LDL < '70mg'$ , HDL > 40 mg.    Personal Goal Other Yes    Personal Goal Improve strength and stamina.    Intervention Attend CR three days per week and continue with his home exercise program.    Expected Outcomes Pt will meet his personal goals.             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 He has multiple risk factors for CAD and is participating in the program for risk modification. He started the program today. His personal goals for the program are to improve his strength and stamina. We will continue to monitor his progress as he works towards meeting these goals. Patient has completed 10 sessions. His current weight is 166.8 lbs down 2 lbs from last 30 day review. He is doing well in the program with consistent attendance and progressions. He blood pressure is at goal with a few hypertensive readings. His personal goals for the program continue to be to improve his strength and stamina. We will continue to monitor his progress as he works towards meeting these goals. Patient has completed 22 sessions. His current weight is 168.9 lbs up 2 lbs from last 30 day review. He continues to do well in the program with consistent attendance and progressions. He blood  pressure continues  to be at goal with a few hypertensive readings. His personal goals for the program continue to be to improve his strength and stamina. We will continue to monitor his progress as he works towards meeting these goals.     Expected Outcomes Patient will complete the program meeting both personal and program goals. Patient will complete the program meeting both personal and program goals. Patient will complete the program meeting both personal and program goals.              Core Components/Risk Factors/Patient Goals at Discharge (Final Review):   Goals and Risk Factor Review - 08/27/21 1112       Core Components/Risk Factors/Patient Goals Review   Personal Goals Review Lipids;Hypertension;Other    Review Patient has completed 22 sessions. His current weight is 168.9 lbs up 2 lbs from last 30 day review. He continues to do well in the program with consistent attendance and progressions. He blood pressure continues to be at goal with a few hypertensive readings. His personal goals for the program continue to be to improve his strength and stamina. We will continue to monitor his progress as he works towards meeting these goals.    Expected Outcomes Patient will complete the program meeting both personal and program goals.             ITP Comments:   Comments: ITP REVIEW Pt is making expected progress toward Cardiac Rehab goals after completing 22 sessions. Recommend continued exercise, life style modification, education, and increased stamina and strength.

## 2021-09-07 ENCOUNTER — Encounter (HOSPITAL_COMMUNITY): Payer: Medicare Other

## 2021-09-10 ENCOUNTER — Encounter (HOSPITAL_COMMUNITY)
Admission: RE | Admit: 2021-09-10 | Discharge: 2021-09-10 | Disposition: A | Discharge status: Home / Self Care | Payer: Medicare Other | Source: Ambulatory Visit | Attending: Thoracic Surgery (Cardiothoracic Vascular Surgery) | Admitting: Thoracic Surgery (Cardiothoracic Vascular Surgery)

## 2021-09-10 DIAGNOSIS — Z951 Presence of aortocoronary bypass graft: Secondary | ICD-10-CM

## 2021-09-10 DIAGNOSIS — I214 Non-ST elevation (NSTEMI) myocardial infarction: Secondary | ICD-10-CM

## 2021-09-10 NOTE — Progress Notes (Signed)
Daily Session Note  Patient Details  Name: Nathaniel Bender. MRN: [id number] Date of Birth: Apr 19, 1950 Referring Provider:   Flowsheet Row CARDIAC REHAB PHASE II ORIENTATION from 07/02/2021 in [place]  Referring Provider Dr. Kipp Brood       Encounter Date: 09/10/2021  Check In:  Session Check In - 09/10/21 1052       Check-In   Supervising physician immediately available to respond to emergencies [facility] MD immediately available    Physician(s) Dr Harl Bowie    Location [facility]    Staff Present Warrick Llera Hassell Done, RN, BSN;Heather Otho Ket, BS, Exercise Physiologist    Virtual Visit No    Medication changes reported     No    Fall or balance concerns reported    No    Tobacco Cessation No Change    Warm-up and Cool-down Performed as group-led instruction    Resistance Training Performed Yes    VAD Patient? No    PAD/SET Patient? No      Pain Assessment   Currently in Pain? No/denies    Multiple Pain Sites No             Capillary Blood Glucose: No results found for this or any previous visit (from the past 24 hour(s)).    Social History   Tobacco Use  Smoking Status Never  Smokeless Tobacco Former    Goals Met:  Independence with exercise equipment Exercise tolerated well No report of concerns or symptoms today Strength training completed today  Goals Unmet:  Not Applicable  Comments: Checkout at 1200.   Dr. Carlyle Dolly is Medical Director for [facility] Cardiac Re[facility]

## 2021-09-12 ENCOUNTER — Encounter (HOSPITAL_COMMUNITY)
Admission: RE | Admit: 2021-09-12 | Discharge: 2021-09-12 | Disposition: A | Discharge status: Home / Self Care | Payer: Medicare Other | Source: Ambulatory Visit | Attending: Thoracic Surgery (Cardiothoracic Vascular Surgery) | Admitting: Thoracic Surgery (Cardiothoracic Vascular Surgery)

## 2021-09-12 DIAGNOSIS — Z951 Presence of aortocoronary bypass graft: Secondary | ICD-10-CM | POA: Diagnosis not present

## 2021-09-12 DIAGNOSIS — I214 Non-ST elevation (NSTEMI) myocardial infarction: Secondary | ICD-10-CM

## 2021-09-12 NOTE — Progress Notes (Signed)
Daily Session Note  Patient Details  Name: Nathaniel Bender. MRN: [id number] Date of Birth: 1950/08/02 Referring Provider:   Flowsheet Row CARDIAC REHAB PHASE II ORIENTATION from 07/02/2021 in [place]  Referring Provider Dr. Kipp Brood       Encounter Date: 09/12/2021  Check In:  Session Check In - 09/12/21 1100       Check-In   Supervising physician immediately available to respond to emergencies [facility] MD immediately available    Physician(s) Dr Harrington Challenger    Location [facility]    Staff Present Jahlia Omura Hassell Done, RN, BSN;Heather Otho Ket, BS, Exercise Physiologist;Dalton Kris Mouton, MS, ACSM-CEP, Exercise Physiologist    Virtual Visit No    Medication changes reported     No    Fall or balance concerns reported    No    Tobacco Cessation No Change    Warm-up and Cool-down Performed as group-led instruction    Resistance Training Performed Yes    VAD Patient? No    PAD/SET Patient? No      Pain Assessment   Currently in Pain? No/denies    Multiple Pain Sites No             Capillary Blood Glucose: No results found for this or any previous visit (from the past 24 hour(s)).    Social History   Tobacco Use  Smoking Status Never  Smokeless Tobacco Former    Goals Met:  Independence with exercise equipment Exercise tolerated well No report of concerns or symptoms today Strength training completed today  Goals Unmet:  Not Applicable  Comments: Checkout at 1200.   Dr. Carlyle Dolly is Medical Director for [facility] Card[facility] Re[facility]

## 2021-09-14 ENCOUNTER — Encounter (HOSPITAL_COMMUNITY): Payer: Medicare Other

## 2021-09-14 ENCOUNTER — Encounter (HOSPITAL_COMMUNITY)
Admission: RE | Admit: 2021-09-14 | Discharge: 2021-09-14 | Disposition: A | Discharge status: Home / Self Care | Payer: Medicare Other | Source: Ambulatory Visit | Attending: Thoracic Surgery (Cardiothoracic Vascular Surgery) | Admitting: Thoracic Surgery (Cardiothoracic Vascular Surgery)

## 2021-09-14 DIAGNOSIS — I214 Non-ST elevation (NSTEMI) myocardial infarction: Secondary | ICD-10-CM

## 2021-09-14 DIAGNOSIS — Z951 Presence of aortocoronary bypass graft: Secondary | ICD-10-CM | POA: Diagnosis not present

## 2021-09-14 NOTE — Progress Notes (Signed)
Daily Session Note  Patient Details  Name: Nathaniel Bender. MRN: [id number] Date of Birth: 1950/06/16 Referring Provider:   Flowsheet Row CARDIAC REHAB PHASE II ORIENTATION from 07/02/2021 in [place]  Referring Provider Dr. Kipp Brood       Encounter Date: 09/14/2021  Check In:  Session Check In - 09/14/21 1100       Check-In   Supervising physician immediately available to respond to emergencies [facility] - Physician supervision    Physician(s) Dr Phineas Inches    Location [facility]    Staff Present Nyana Haren Hassell Done, RN, BSN;Heather Otho Ket, BS, Exercise Physiologist    Virtual Visit No    Medication changes reported     No    Fall or balance concerns reported    No    Tobacco Cessation No Change    Warm-up and Cool-down Performed as group-led instruction    Resistance Training Performed Yes    VAD Patient? No    PAD/SET Patient? No      Pain Assessment   Currently in Pain? No/denies    Multiple Pain Sites No             Capillary Blood Glucose: No results found for this or any previous visit (from the past 24 hour(s)).    Social History   Tobacco Use  Smoking Status Never  Smokeless Tobacco Former    Goals Met:  Independence with exercise equipment Exercise tolerated well No report of concerns or symptoms today Strength training completed today  Goals Unmet:  Not Applicable  Comments: Checkout at 1200.   Dr. Carlyle Dolly is Medical Director for [facility] Cardiac Rehab

## 2021-09-17 ENCOUNTER — Encounter (HOSPITAL_COMMUNITY)
Admission: RE | Admit: 2021-09-17 | Discharge: 2021-09-17 | Disposition: A | Discharge status: Home / Self Care | Payer: Medicare Other | Source: Ambulatory Visit | Attending: Thoracic Surgery (Cardiothoracic Vascular Surgery) | Admitting: Thoracic Surgery (Cardiothoracic Vascular Surgery)

## 2021-09-17 ENCOUNTER — Encounter (HOSPITAL_COMMUNITY): Payer: Medicare Other

## 2021-09-17 VITALS — Wt 171.5 lb

## 2021-09-17 DIAGNOSIS — Z951 Presence of aortocoronary bypass graft: Secondary | ICD-10-CM | POA: Diagnosis not present

## 2021-09-17 DIAGNOSIS — I214 Non-ST elevation (NSTEMI) myocardial infarction: Secondary | ICD-10-CM

## 2021-09-17 NOTE — Progress Notes (Signed)
Daily Session Note  Patient Details  Name: Nathaniel Bender. MRN: [id number] Date of Birth: August 03, 1950 Referring Provider:   Flowsheet Row CARDIAC REHAB PHASE II ORIENTATION from 07/02/2021 in [place]  Referring Provider Dr. Kipp Brood       Encounter Date: 09/17/2021  Check In:  Session Check In - 09/17/21 1445       Check-In   Supervising physician immediately available to respond to emergencies [facility] MD immediately available    Physician(s) Dr. Marlou Porch    Location [facility]    Staff Present Redge Gainer, BS, Exercise Physiologist;Daphyne Hassell Done, RN, BSN    Virtual Visit No    Medication changes reported     No    Fall or balance concerns reported    No    Tobacco Cessation No Change    Warm-up and Cool-down Performed as group-led instruction    Resistance Training Performed Yes    VAD Patient? No    PAD/SET Patient? No      Pain Assessment   Currently in Pain? No/denies    Multiple Pain Sites No             Capillary Blood Glucose: No results found for this or any previous visit (from the past 24 hour(s)).    Social History   Tobacco Use  Smoking Status Never  Smokeless Tobacco Former    Goals Met:  Independence with exercise equipment Exercise tolerated well No report of concerns or symptoms today Strength training completed today  Goals Unmet:  Not Applicable  Comments: check out 1545   Dr. Carlyle Dolly is Medical Director for [facility]

## 2021-09-19 ENCOUNTER — Encounter (HOSPITAL_COMMUNITY): Payer: Medicare Other

## 2021-09-21 ENCOUNTER — Encounter (HOSPITAL_COMMUNITY)
Admission: RE | Admit: 2021-09-21 | Discharge: 2021-09-21 | Disposition: A | Discharge status: Home / Self Care | Payer: Medicare Other | Source: Ambulatory Visit | Attending: Thoracic Surgery (Cardiothoracic Vascular Surgery) | Admitting: Thoracic Surgery (Cardiothoracic Vascular Surgery)

## 2021-09-21 ENCOUNTER — Encounter (HOSPITAL_COMMUNITY): Payer: Medicare Other

## 2021-09-21 DIAGNOSIS — I214 Non-ST elevation (NSTEMI) myocardial infarction: Secondary | ICD-10-CM | POA: Diagnosis present

## 2021-09-21 DIAGNOSIS — Z951 Presence of aortocoronary bypass graft: Secondary | ICD-10-CM | POA: Diagnosis present

## 2021-09-21 NOTE — Progress Notes (Signed)
Daily Session Note  Patient Details  Name: Nathaniel Bender. MRN: [id number] Date of Birth: [birth year] Referring Provider:   Flowsheet Row CARDIAC REHAB PHASE II ORIENTATION from 07/02/2021 in [place]  Referring Provider Dr. Kipp Brood       Encounter Date: 09/21/2021  Check In:  Session Check In - 09/21/21 1445       Check-In   Supervising physician immediately available to respond to emergencies [facility] MD immediately available    Physician(s) Dr. Debara Pickett    Location [facility]    Staff Present Geanie Cooley, RN;Heather Otho Ket, BS, Exercise Physiologist;Debra Wynetta Emery, RN, Bjorn Loser, MS, ACSM-CEP, Exercise Physiologist    Virtual Visit No    Medication changes reported     No    Fall or balance concerns reported    No    Tobacco Cessation No Change    Warm-up and Cool-down Performed as group-led instruction    Resistance Training Performed Yes    VAD Patient? No    PAD/SET Patient? No      Pain Assessment   Currently in Pain? No/denies    Multiple Pain Sites No             Capillary Blood Glucose: No results found for this or any previous visit (from the past 24 hour(s)).    Social History   Tobacco Use  Smoking Status Never  Smokeless Tobacco Former    Goals Met:  Independence with exercise equipment Exercise tolerated well No report of concerns or symptoms today Strength training completed today  Goals Unmet:  Not Applicable  Comments: checkout @ 3:45pm   Dr. Carlyle Dolly is Medical Director for [place]

## 2021-09-24 ENCOUNTER — Encounter (HOSPITAL_COMMUNITY): Payer: Medicare Other

## 2021-09-26 ENCOUNTER — Encounter (HOSPITAL_COMMUNITY)
Admission: RE | Admit: 2021-09-26 | Discharge: 2021-09-26 | Disposition: A | Discharge status: Home / Self Care | Payer: Medicare Other | Source: Ambulatory Visit | Attending: Thoracic Surgery (Cardiothoracic Vascular Surgery) | Admitting: Thoracic Surgery (Cardiothoracic Vascular Surgery)

## 2021-09-26 VITALS — Ht 67.0 in | Wt 171.5 lb

## 2021-09-26 DIAGNOSIS — Z951 Presence of aortocoronary bypass graft: Secondary | ICD-10-CM

## 2021-09-26 DIAGNOSIS — I214 Non-ST elevation (NSTEMI) myocardial infarction: Secondary | ICD-10-CM

## 2021-09-26 NOTE — Progress Notes (Signed)
Daily Session Note  Patient Details  Name: Nathaniel Bender. MRN: [id number] Date of Birth: 1950-09-06 Referring Provider:   Flowsheet Row CARDIAC REHAB PHASE II ORIENTATION from 07/02/2021 in [place]  Referring Provider Dr. Kipp Brood       Encounter Date: 09/26/2021  Check In:  Session Check In - 09/26/21 1445       Check-In   Supervising physician immediately available to respond to emergencies [facility] MD immediately available    Physician(s) Dr Harl Bowie    Location [facility]    Staff Present Redge Gainer, BS, Exercise Physiologist;Debra Wynetta Emery, RN, Joanette Gula, RN, BSN    Virtual Visit No    Medication changes reported     No    Fall or balance concerns reported    No    Tobacco Cessation No Change    Warm-up and Cool-down Performed as group-led instruction    Resistance Training Performed Yes    VAD Patient? No    PAD/SET Patient? No      Pain Assessment   Currently in Pain? No/denies    Multiple Pain Sites No             Capillary Blood Glucose: No results found for this or any previous visit (from the past 24 hour(s)).    Social History   Tobacco Use  Smoking Status Never  Smokeless Tobacco Former    Goals Met:  Independence with exercise equipment Exercise tolerated well No report of concerns or symptoms today Strength training completed today  Goals Unmet:  Not Applicable  Comments: Checkout at 1545.   Dr. Carlyle Dolly is Medical Director for [facility] Card[facility] Re[facility]

## 2021-10-03 LAB — HEPATIC FUNCTION PANEL
ALT: 27 IU/L (ref 0–44)
AST: 25 IU/L (ref 0–40)
Albumin: 4.8 g/dL (ref 3.9–4.9)
Alkaline Phosphatase: 71 IU/L (ref 44–121)
Bilirubin Total: 0.8 mg/dL (ref 0.0–1.2)
Bilirubin, Direct: 0.22 mg/dL (ref 0.00–0.40)
Total Protein: 6.8 g/dL (ref 6.0–8.5)

## 2021-10-03 LAB — LIPID PANEL
Chol/HDL Ratio: 2.7 ratio (ref 0.0–5.0)
Cholesterol, Total: 119 mg/dL (ref 100–199)
HDL: 44 mg/dL (ref >39)
LDL Chol Calc (NIH): 54 mg/dL (ref 0–99)
Triglycerides: 119 mg/dL (ref 0–149)
VLDL Cholesterol Cal: 21 mg/dL (ref 5–40)

## 2021-10-04 ENCOUNTER — Telehealth (HOSPITAL_BASED_OUTPATIENT_CLINIC_OR_DEPARTMENT_OTHER): Payer: Self-pay

## 2021-10-04 NOTE — Telephone Encounter (Addendum)
Called results to patient and left results on VM (ok per DPR), instructions left to call office back if patient has any questions!    ----- Message from Loel Dubonnet, NP sent at 10/03/2021  7:21 AM EDT ----- Normal liver function.  Cholesterol numbers at goal.  Great result!  Continue current medications.

## 2021-10-05 NOTE — Progress Notes (Signed)
Discharge Progress Report  Patient Details  Name: Nathaniel Bender. MRN: [id number] Date of Birth: 28-May-1950 Referring Provider:   Flowsheet Row CARDIAC REHAB PHASE II ORIENTATION from 07/02/2021 in [facility] CARDIAC REHABILITATION  Referring Provider Dr. Cliffton Asters        Number of Visits: 28  Reason for Discharge:  Patient reached a stable level of exercise. Patient independent in their exercise. Patient has met program and personal goals.  Smoking History:  Social History   Tobacco Use  Smoking Status Never  Smokeless Tobacco Former    Diagnosis:  S/P CABG x 4  NSTEMI (non-ST elevated myocardial infarction) (HCC)  ADL [facility]:   Initial Exercise Prescription:  Initial Exercise Prescription - 07/02/21 1300       Date of Initial Exercise RX and Referring Provider   Date 07/02/21    Referring Provider Dr. Cliffton Asters    Expected Discharge Date 09/24/21      Treadmill   MPH 1.5    Grade 0    Minutes 22      Recumbant Elliptical   Level 1    RPM 60    Minutes 17      Prescription Details   Frequency (times per week) 3    Duration Progress to 30 minutes of continuous aerobic without signs/symptoms of physical distress      Intensity   THRR 40-80% of Max Heartrate 60-121    Ratings of Perceived Exertion 11-13      Resistance Training   Training Prescription Yes    Weight 4    Reps 10-15             Discharge Exercise Prescription (Final Exercise Prescription Changes):  Exercise Prescription Changes - 09/17/21 1500       Response to Exercise   Blood Pressure (Admit) 120/60    Blood Pressure (Exercise) 132/70    Blood Pressure (Exit) 102/60    Heart Rate (Admit) 44 bpm    Heart Rate (Exercise) 78 bpm    Heart Rate (Exit) 53 bpm    Rating of Perceived Exertion (Exercise) 12    Duration Continue with 30 min of aerobic exercise without signs/symptoms of physical distress.    Intensity THRR unchanged      Progression   Progression Continue to  progress workloads to maintain intensity without signs/symptoms of physical distress.      Resistance Training   Training Prescription Yes    Weight 5    Reps 10-15    Time 10 Minutes      Treadmill   MPH 2.6    Grade 5    Minutes 17    METs 4.78      Recumbant Elliptical   Level 5    RPM 70    Minutes 22    METs 5.9             Functional Capacity:  6 Minute Walk     Row Name 07/02/21 1319 09/26/21 1553       6 Minute Walk   Phase Initial Discharge    Distance 1600 feet 1750 feet    Distance Feet Change -- 150 ft    Walk Time 6 minutes 6 minutes    # of Rest Breaks 0 0    MPH 3.03 3.31    METS 3.05 3.46    RPE 11 9    VO2 Peak 10.69 12.14    Symptoms No No    Resting HR 42 bpm 50 bpm  Resting BP 98/64 122/60    Resting Oxygen Saturation  97 % 98 %    Exercise Oxygen Saturation  during 6 min walk 97 % 96 %    Max Ex. HR 54 bpm 65 bpm    Max Ex. BP 122/60 138/60    2 Minute Post BP 110/62 130/60             Psychological, QOL, Others - Outcomes: PHQ 2/9:    07/02/2021   12:39 PM 08/06/2019   10:39 AM 02/05/2019    1:43 PM 07/03/2018    9:56 AM 01/01/2018    9:32 AM  Depression screen PHQ 2/9  Decreased Interest 0 0 0 0 0  Down, Depressed, Hopeless 0 0 0 0 0  PHQ - 2 Score 0 0 0 0 0  Altered sleeping 1      Tired, decreased energy 1      Change in appetite 1      Feeling bad or failure about yourself  0      Trouble concentrating 1      Moving slowly or fidgety/restless 0      Suicidal thoughts 0      PHQ-9 Score 4      Difficult doing work/chores Not difficult at all        Quality of Life:  Quality of Life - 09/26/21 1555       Quality of Life   Select Quality of Life      Quality of Life Scores   Health/Function Pre 24.6 %    Health/Function Post 27.6 %    Health/Function % Change 12.2 %    Socioeconomic Pre 25.93 %    Socioeconomic Post 30 %    Socioeconomic % Change  15.7 %    Psych/Spiritual Pre 28 %    Psych/Spiritual  Post 27 %    Psych/Spiritual % Change -3.57 %    Family Pre 25.5 %    Family Post 28.5 %    Family % Change 11.76 %    GLOBAL Pre 25.64 %    GLOBAL Post 28.13 %    GLOBAL % Change 9.71 %             Personal Goals: Goals established at orientation with interventions provided to work toward goal.  Personal Goals and Risk Factors at Admission - 07/02/21 1320       Core Components/Risk Factors/Patient Goals on Admission   Hypertension Yes    Intervention Provide education on lifestyle modifcations including regular physical activity/exercise, weight management, moderate sodium restriction and increased consumption of fresh fruit, vegetables, and low fat dairy, alcohol moderation, and smoking cessation.;Monitor prescription use compliance.    Expected Outcomes Short Term: Continued assessment and intervention until BP is < 140/21mm HG in hypertensive participants. < 130/64mm HG in hypertensive participants with diabetes, heart failure or chronic kidney disease.;Long Term: Maintenance of blood pressure at goal levels.    Lipids Yes    Intervention Provide education and support for participant on nutrition & aerobic/resistive exercise along with prescribed medications to achieve LDL '70mg'$ , HDL >$Remo'40mg'Lqyvn$ .    Expected Outcomes Short Term: Participant states understanding of desired cholesterol values and is compliant with medications prescribed. Participant is following exercise prescription and nutrition guidelines.;Long Term: Cholesterol controlled with medications as prescribed, with individualized exercise RX and with personalized nutrition plan. Value goals: LDL < $Rem'70mg'aBjK$ , HDL > 40 mg.    Personal Goal Other Yes    Personal Goal Improve strength  and stamina.    Intervention Attend CR three days per week and continue with his home exercise program.    Expected Outcomes Pt will meet his personal goals.              Personal Goals Discharge:  Goals and Risk Factor Review     Row Name  07/04/21 1237 07/30/21 1406 08/27/21 1112 10/05/21 0726       Core Components/Risk Factors/Patient Goals Review   Personal Goals Review Lipids;Hypertension;Other Lipids;Hypertension;Other Lipids;Hypertension;Other Lipids;Hypertension;Other    Review Patient was referred to CR with CABGx4 and NSTEMI. He has multiple risk factors for CAD and is participating in the program for risk modification. He started the program today. His personal goals for the program are to improve his strength and stamina. We will continue to monitor his progress as he works towards meeting these goals. Patient has completed 10 sessions. His current weight is 166.8 lbs down 2 lbs from last 30 day review. He is doing well in the program with consistent attendance and progressions. He blood pressure is at goal with a few hypertensive readings. His personal goals for the program continue to be to improve his strength and stamina. We will continue to monitor his progress as he works towards meeting these goals. Patient has completed 22 sessions. His current weight is 168.9 lbs up 2 lbs from last 30 day review. He continues to do well in the program with consistent attendance and progressions. He blood pressure continues to be at goal with a few hypertensive readings. His personal goals for the program continue to be to improve his strength and stamina. We will continue to monitor his progress as he works towards meeting these goals. Pt graduated after 28 sessions. His vitals were at goal for the most part, but he did have several hypertensive resting readings, but he would often report that he was rushing to get to class on these days. His most recent lipid panel showed that he had made improvements in his lipids and LDL cholesterol. His weight was stable while in the program. He was able to improve his strength and stamina as evidenced by his 9.4% increase in his walk test distance.    Expected Outcomes Patient will complete the program  meeting both personal and program goals. Patient will complete the program meeting both personal and program goals. Patient will complete the program meeting both personal and program goals. Pt will continue to work towards their goals post discharge.             Exercise Goals and Review:  Exercise Goals     Row Name 07/02/21 1321 07/09/21 1436 08/06/21 1339 09/04/21 0832       Exercise Goals   Increase Physical Activity Yes Yes Yes Yes    Intervention Provide advice, education, support and counseling about physical activity/exercise needs.;Develop an individualized exercise prescription for aerobic and resistive training based on initial evaluation findings, risk stratification, comorbidities and participant's personal goals. Provide advice, education, support and counseling about physical activity/exercise needs.;Develop an individualized exercise prescription for aerobic and resistive training based on initial evaluation findings, risk stratification, comorbidities and participant's personal goals. Provide advice, education, support and counseling about physical activity/exercise needs.;Develop an individualized exercise prescription for aerobic and resistive training based on initial evaluation findings, risk stratification, comorbidities and participant's personal goals. Provide advice, education, support and counseling about physical activity/exercise needs.;Develop an individualized exercise prescription for aerobic and resistive training based on initial evaluation findings, risk stratification, comorbidities and  participant's personal goals.    Expected Outcomes Short Term: Attend rehab on a regular basis to increase amount of physical activity.;Long Term: Add in home exercise to make exercise part of routine and to increase amount of physical activity.;Long Term: Exercising regularly at least 3-5 days a week. Short Term: Attend rehab on a regular basis to increase amount of physical  activity.;Long Term: Add in home exercise to make exercise part of routine and to increase amount of physical activity.;Long Term: Exercising regularly at least 3-5 days a week. Short Term: Attend rehab on a regular basis to increase amount of physical activity.;Long Term: Add in home exercise to make exercise part of routine and to increase amount of physical activity.;Long Term: Exercising regularly at least 3-5 days a week. Short Term: Attend rehab on a regular basis to increase amount of physical activity.;Long Term: Add in home exercise to make exercise part of routine and to increase amount of physical activity.;Long Term: Exercising regularly at least 3-5 days a week.    Increase Strength and Stamina Yes Yes Yes Yes    Intervention Provide advice, education, support and counseling about physical activity/exercise needs.;Develop an individualized exercise prescription for aerobic and resistive training based on initial evaluation findings, risk stratification, comorbidities and participant's personal goals. Provide advice, education, support and counseling about physical activity/exercise needs.;Develop an individualized exercise prescription for aerobic and resistive training based on initial evaluation findings, risk stratification, comorbidities and participant's personal goals. Provide advice, education, support and counseling about physical activity/exercise needs.;Develop an individualized exercise prescription for aerobic and resistive training based on initial evaluation findings, risk stratification, comorbidities and participant's personal goals. Provide advice, education, support and counseling about physical activity/exercise needs.;Develop an individualized exercise prescription for aerobic and resistive training based on initial evaluation findings, risk stratification, comorbidities and participant's personal goals.    Expected Outcomes Short Term: Increase workloads from initial exercise  prescription for resistance, speed, and METs.;Short Term: Perform resistance training exercises routinely during rehab and add in resistance training at home;Long Term: Improve cardiorespiratory fitness, muscular endurance and strength as measured by increased METs and functional capacity (6MWT) Short Term: Increase workloads from initial exercise prescription for resistance, speed, and METs.;Short Term: Perform resistance training exercises routinely during rehab and add in resistance training at home;Long Term: Improve cardiorespiratory fitness, muscular endurance and strength as measured by increased METs and functional capacity (6MWT) Short Term: Increase workloads from initial exercise prescription for resistance, speed, and METs.;Short Term: Perform resistance training exercises routinely during rehab and add in resistance training at home;Long Term: Improve cardiorespiratory fitness, muscular endurance and strength as measured by increased METs and functional capacity (6MWT) Short Term: Increase workloads from initial exercise prescription for resistance, speed, and METs.;Short Term: Perform resistance training exercises routinely during rehab and add in resistance training at home;Long Term: Improve cardiorespiratory fitness, muscular endurance and strength as measured by increased METs and functional capacity (6MWT)    Able to understand and use rate of perceived exertion (RPE) scale Yes Yes Yes Yes    Intervention Provide education and explanation on how to use RPE scale Provide education and explanation on how to use RPE scale Provide education and explanation on how to use RPE scale Provide education and explanation on how to use RPE scale    Expected Outcomes Short Term: Able to use RPE daily in rehab to express subjective intensity level;Long Term:  Able to use RPE to guide intensity level when exercising independently Short Term: Able to use RPE daily in  rehab to express subjective intensity  level;Long Term:  Able to use RPE to guide intensity level when exercising independently Short Term: Able to use RPE daily in rehab to express subjective intensity level;Long Term:  Able to use RPE to guide intensity level when exercising independently Short Term: Able to use RPE daily in rehab to express subjective intensity level;Long Term:  Able to use RPE to guide intensity level when exercising independently    Knowledge and understanding of Target Heart Rate Range (THRR) Yes Yes Yes Yes    Intervention Provide education and explanation of THRR including how the numbers were predicted and where they are located for reference Provide education and explanation of THRR including how the numbers were predicted and where they are located for reference Provide education and explanation of THRR including how the numbers were predicted and where they are located for reference Provide education and explanation of THRR including how the numbers were predicted and where they are located for reference    Expected Outcomes Short Term: Able to state/look up THRR;Long Term: Able to use THRR to govern intensity when exercising independently;Short Term: Able to use daily as guideline for intensity in rehab Short Term: Able to state/look up THRR;Long Term: Able to use THRR to govern intensity when exercising independently;Short Term: Able to use daily as guideline for intensity in rehab Short Term: Able to state/look up THRR;Long Term: Able to use THRR to govern intensity when exercising independently;Short Term: Able to use daily as guideline for intensity in rehab Short Term: Able to state/look up THRR;Long Term: Able to use THRR to govern intensity when exercising independently;Short Term: Able to use daily as guideline for intensity in rehab    Able to check pulse independently Yes Yes Yes Yes    Intervention Provide education and demonstration on how to check pulse in carotid and radial arteries.;Review the importance  of being able to check your own pulse for safety during independent exercise Provide education and demonstration on how to check pulse in carotid and radial arteries.;Review the importance of being able to check your own pulse for safety during independent exercise Provide education and demonstration on how to check pulse in carotid and radial arteries.;Review the importance of being able to check your own pulse for safety during independent exercise Provide education and demonstration on how to check pulse in carotid and radial arteries.;Review the importance of being able to check your own pulse for safety during independent exercise    Expected Outcomes Short Term: Able to explain why pulse checking is important during independent exercise;Long Term: Able to check pulse independently and accurately Short Term: Able to explain why pulse checking is important during independent exercise;Long Term: Able to check pulse independently and accurately Short Term: Able to explain why pulse checking is important during independent exercise;Long Term: Able to check pulse independently and accurately Short Term: Able to explain why pulse checking is important during independent exercise;Long Term: Able to check pulse independently and accurately    Understanding of Exercise Prescription Yes Yes Yes Yes    Intervention Provide education, explanation, and written materials on patient's individual exercise prescription Provide education, explanation, and written materials on patient's individual exercise prescription Provide education, explanation, and written materials on patient's individual exercise prescription Provide education, explanation, and written materials on patient's individual exercise prescription    Expected Outcomes Short Term: Able to explain program exercise prescription;Long Term: Able to explain home exercise prescription to exercise independently Short Term: Able to explain  program exercise  prescription;Long Term: Able to explain home exercise prescription to exercise independently Short Term: Able to explain program exercise prescription;Long Term: Able to explain home exercise prescription to exercise independently Short Term: Able to explain program exercise prescription;Long Term: Able to explain home exercise prescription to exercise independently             Exercise Goals Re-Evaluation:  Exercise Goals Re-Evaluation     Row Name 07/09/21 1437 08/06/21 1331 09/04/21 0833         Exercise Goal Re-Evaluation   Exercise Goals Review Increase Physical Activity;Increase Strength and Stamina;Able to understand and use rate of perceived exertion (RPE) scale;Knowledge and understanding of Target Heart Rate Range (THRR);Able to check pulse independently;Understanding of Exercise Prescription Increase Physical Activity;Increase Strength and Stamina;Able to understand and use rate of perceived exertion (RPE) scale;Able to check pulse independently;Knowledge and understanding of Target Heart Rate Range (THRR);Understanding of Exercise Prescription Increase Physical Activity;Increase Strength and Stamina;Able to understand and use rate of perceived exertion (RPE) scale;Knowledge and understanding of Target Heart Rate Range (THRR);Able to check pulse independently;Understanding of Exercise Prescription     Comments Pt has completed 3 sessions of caridac rehab. He is very motivated to be in class and is eagar to increase his workload. He is currently exercising at 4.8 METs on the ellp. Will continue to monitor and progress as able. Pt has completed 14 sessions of cardiac rehab. He continues to be motivated in class and is pushing himself to increase his workload. He is currently exercising at 6.6 METs on the ellp. Will continue to monitor and progress as able. Pt has completed 22 sesions of cardiac rehab. He continues to increase his workloads and push himself during classes. He is currently  exercising at 6.7 METs on the ellp. Will continue to monitor and progress as able.     Expected Outcomes Through exercise at home and at rehab, the patient will meet their stated goals. Through exercise at home and at rehab, the patient will meet their stated goals. Through exercise at home and at rehab, the patient will meet their stated goals.              Nutrition & Weight - Outcomes:  Pre Biometrics - 07/02/21 1322       Pre Biometrics   Height $Remov'5\' 7"'OYxaqO$  (1.702 m)    Weight 167 lb 12.3 oz (76.1 kg)    Waist Circumference 34 inches    Hip Circumference 36 inches    Waist to Hip Ratio 0.94 %    BMI (Calculated) 26.27    Triceps Skinfold 10 mm    % Body Fat 22.7 %    Grip Strength 27.7 kg    Flexibility 9 in    Single Leg Stand 60 seconds             Post Biometrics - 09/26/21 1554        Post  Biometrics   Height $Remov'5\' 7"'GXdUde$  (1.702 m)    Weight 171 lb 8.3 oz (77.8 kg)    Waist Circumference 38 inches    Hip Circumference 37.5 inches    Waist to Hip Ratio 1.01 %    BMI (Calculated) 26.86    Triceps Skinfold 10 mm    % Body Fat 24.8 %    Grip Strength 29.8 kg    Flexibility 9 in    Single Leg Stand 60 seconds             Nutrition:  Nutrition Therapy & Goals - 07/02/21 1251       Intervention Plan   Intervention Nutrition handout(s) given to patient.    Expected Outcomes Short Term Goal: Understand basic principles of dietary content, such as calories, fat, sodium, cholesterol and nutrients.             Nutrition Discharge:  Nutrition Assessments - 10/05/21 0723       MEDFICTS Scores   Pre Score 55    Post Score 47    Score Difference -8             Education Questionnaire Score:  Knowledge Questionnaire Score - 10/05/21 6861       Knowledge Questionnaire Score   Pre Score 22/24    Post Score 24/24             Goals reviewed with patient; copy given to patient. Pt graduated from CR after 28 sessions. He was able to improve his  walk test distance by 9.4%, and his MET level at discharge was 6.9. He reports that he will continue to exercise post discharge by joining a local gym.

## 2021-10-08 ENCOUNTER — Encounter (HOSPITAL_BASED_OUTPATIENT_CLINIC_OR_DEPARTMENT_OTHER): Payer: Self-pay | Admitting: Cardiology

## 2021-10-08 ENCOUNTER — Ambulatory Visit (HOSPITAL_BASED_OUTPATIENT_CLINIC_OR_DEPARTMENT_OTHER): Payer: Medicare Other | Admitting: Cardiology

## 2021-10-08 VITALS — BP 128/68 | HR 46 | Ht 66.0 in | Wt 170.4 lb

## 2021-10-08 DIAGNOSIS — E785 Hyperlipidemia, unspecified: Secondary | ICD-10-CM | POA: Diagnosis not present

## 2021-10-08 DIAGNOSIS — I493 Ventricular premature depolarization: Secondary | ICD-10-CM | POA: Insufficient documentation

## 2021-10-08 DIAGNOSIS — I10 Essential (primary) hypertension: Secondary | ICD-10-CM

## 2021-10-08 DIAGNOSIS — Z951 Presence of aortocoronary bypass graft: Secondary | ICD-10-CM | POA: Diagnosis not present

## 2021-10-08 DIAGNOSIS — R0609 Other forms of dyspnea: Secondary | ICD-10-CM

## 2021-10-08 DIAGNOSIS — I2510 Atherosclerotic heart disease of native coronary artery without angina pectoris: Secondary | ICD-10-CM

## 2021-10-08 NOTE — Progress Notes (Signed)
Cardiology Office Note:    Date:  10/08/2021   ID:  Nathaniel Bender., DOB 04-Oct-1950, MRN [id number]  PCP:  Bernerd Limbo, MD  Cardiologist:  Buford Dresser, MD PhD  Referring MD: Bernerd Limbo, MD   CC: follow up  History of Present Illness:    Nathaniel Devera. is a 71 y.o. male with a hx of CAD s/p CABG, HTN, HLD, glucose intolerance, single episode of afib at time of NSTEMI who is here for follow up. Previously seen as a new consult at the request of Bernerd Limbo, MD for the evaluation and management of bradycardia.  Cardiac history: He presented to the ED on 05/06/2021 for exertional chest pain and shortness of breath with a near syncopal episode lasting 15 minutes before resolution. The ED workup revealed Atrial fibrillation with RVR. His initial Troponin level was 31. He was admitted to the hospital. Ruled in for NSTEMI. CABG was discussed and agreed upon. Pre-operative carotid duplex US showed no significant internal carotid artery stenosis bilaterally. On 05/08/21 he underwent CABG x 4. Lopressor was increased for PVCs on 4/20. He had increasing blood pressure and was gradually resumed on his home medications, including Cozaar and Norvasc. He was discharged in stable condition and referred to cardiac rehabilitation.   Today: He reports that he notices shortness of breath during exercise when he is walking his dogs through woods and over hills. He keeps walking and it resolves a few minutes later. At physical therapy for thirty minutes on the treadmill and bike he had no issues. He believes it began in the past 30-45 days.   He reports a general feeling of tiredness and aching in his hips after work. He believes some of it is work-related, but experiences general fatigue that makes him wonder if it is statin related.  Of note, he is still experiencing some left chest and bilateral LE numbness post operatively.  He recently finished cardiac physical therapy. He is currently  participating in PT with [facility] for his shoulder.   He is interested in [place] [facility] to participate in exercise with his wife.  He believes he has gained about five lbs since his surgery. He used to be 162 lbs, and now he is seeing numbers around 166 lbs. He wonders if this could be due to fluid retention in his legs since they seem a bit bigger to him.   He denies any palpitations, chest pain, or peripheral edema. No lightheadedness, headaches, syncope, orthopnea, or PND.  Past Medical History:  Diagnosis Date   Arthritis    Degenerative Disc Disease Cervical; s/p injection [facility].   Atypical mole 05/04/2013   Right Wrist-Severe   Cancer ([place]) 07/21/2013   Skin cancer unknown type   Colon polyps    GERD (gastroesophageal reflux disease)    Gout    Hyperlipidemia    Hypertension    Meniere's disease of left ear    s/p ENT evaluation; acute hearing loss; chronic tinnitus L now.  Recovered hearing completely.   SCCA (squamous cell carcinoma) of skin 08/08/2014   Left Forehead-Well Diff (curet and 5FU)    Past Surgical History:  Procedure Laterality Date   CLIPPING OF ATRIAL APPENDAGE Left 05/08/2021   Procedure: CLIPPING OF ATRIAL APPENDAGE;  Surgeon: Lajuana Matte, MD;  Location: [facility];  Service: Open Heart Surgery;  Laterality: Left;   CORONARY ARTERY BYPASS GRAFT N/A 05/08/2021   Procedure: CORONARY ARTERY BYPASS GRAFTING x4 USING LEFT INTERNAL MAMMORY  ARTERY AND BILATERAL GREATER SAPHENOUS VEIN;  Surgeon: Lajuana Matte, MD;  Location: [facility];  Service: Open Heart Surgery;  Laterality: N/A;  atriclip placement.  flow trac   ENDOVEIN HARVEST OF GREATER SAPHENOUS VEIN Bilateral 05/08/2021   Procedure: ENDOVEIN HARVEST OF GREATER SAPHENOUS VEIN;  Surgeon: Lajuana Matte, MD;  Location: [facility];  Service: Open Heart Surgery;  Laterality: Bilateral;   LEFT HEART CATH AND CORONARY ANGIOGRAPHY N/A 05/07/2021   Procedure: LEFT HEART CATH  AND CORONARY ANGIOGRAPHY;  Surgeon: Nelva Bush, MD;  Location: [facility];  Service: Cardiovascular;  Laterality: N/A;   ROTATOR CUFF REPAIR Right    TEE WITHOUT CARDIOVERSION  05/08/2021   Procedure: TRANSESOPHAGEAL ECHOCARDIOGRAM (TEE);  Surgeon: Lajuana Matte, MD;  Location: [facility];  Service: Open Heart Surgery;;   TONSILLECTOMY      Current Medications: Current Outpatient Medications on File Prior to Visit  Medication Sig   amLODipine (NORVASC) 5 MG tablet Take 1 tablet (5 mg total) by mouth daily.   aspirin (ASPIRIN 81) 81 MG chewable tablet Chew 81 mg by mouth daily.   atorvastatin (LIPITOR) 80 MG tablet Take 1 tablet (80 mg total) by mouth daily.   clindamycin (CLEOCIN T) 1 % external solution SMARTSIG:1 sparingly Topical Twice Daily   ezetimibe (ZETIA) 10 MG tablet Take 1 tablet (10 mg total) by mouth daily. (Patient taking differently: Take 10 mg by mouth every evening.)   Ibuprofen-diphenhydrAMINE Cit (MOTRIN PM PO) Take by mouth.   losartan (COZAAR) 50 MG tablet Take 50 mg by mouth daily.   Metoprolol Tartrate 37.5 MG TABS Take 37.5 mg by mouth 2 (two) times daily.   naproxen sodium (ALEVE) 220 MG tablet Take 220 mg by mouth daily as needed (pain.).   omeprazole (PRILOSEC) 20 MG capsule Take 1 capsule (20 mg total) by mouth daily.   No current facility-administered medications on file prior to visit.     Allergies:   Patient has no known allergies.   Social History   Socioeconomic History   Marital status: Married    Spouse name: Not on file   Number of children: Not on file   Years of education: Not on file   Highest education level: Not on file  Occupational History   Occupation: Sales  Tobacco Use   Smoking status: Never   Smokeless tobacco: Former  Scientific laboratory technician Use: Never used  Substance and Sexual Activity   Alcohol use: Yes    Alcohol/week: 20.0 standard drinks of alcohol    Types: 10 Glasses of wine, 10 Standard drinks or  equivalent per week   Drug use: No   Sexual activity: Yes  Other Topics Concern   Not on file  Social History Narrative   Marital:  Married. X 26 years.        Children: none      Lives: with wife       Education: College/Other. Exercise: Yes.      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      Tobacco: never      Alcohol:  2-3 per day; more on weekends; wine.      Exercise:  Walking 2 days per week; 2-4 miles.      Seatbelt:  100%      Guns: 12 gauge shot gun.      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  Previously ran a  road race team; [place] [address].     Social Determinants of Health   Financial Resource Strain: Not on file  Food Insecurity: Not on file  Transportation Needs: Not on file  Physical Activity: Not on file  Stress: Not on file  Social Connections: Not on file     Family History: The patient's family history includes Arthritis (age of onset: 25) in his mother; Diabetes in his father; Heart disease in his maternal grandfather; Heart disease (age of onset: 2) in his father; Hyperlipidemia in his father, mother, and sister; Hypertension in his father, mother, and sister; Mental illness in his paternal grandmother; Stroke in his paternal grandfather.   Mat gpa died when he was in high school, unknown type of heart disease Father died 4 years ago, heart disease/other factors at 69 yo. Diagnosed in his 29s, had open heart surgery for CAD.  Mother is 47, very healthy, no known heart disease.  ROS:   Please see the history of present illness.   (+) Exertional shortness of breath (+) Fatigue (+) Joint/muscle soreness  Additional pertinent ROS otherwise negative.  EKGs/Labs/Other Studies Reviewed:    The following studies were reviewed today:  Pre-CABG Carotid Doppler 05/07/2021: Summary:  Right Carotid: Velocities in the right ICA are consistent with a 1-39%  stenosis.   Left Carotid: Velocities in  the left ICA are consistent with a 1-39%  stenosis.  Vertebrals:  Bilateral vertebral arteries demonstrate antegrade flow.  Subclavians: Normal flow hemodynamics were seen in bilateral subclavian               arteries.   Right Upper Extremity: Doppler waveforms decrease 50% with right radial  compression. Doppler waveforms remain within normal limits with right  ulnar compression.  Left Upper Extremity: Doppler waveforms remain within normal limits with  left radial compression. Doppler waveforms remain within normal limits  with left ulnar compression.    Left Heart Cath 05/07/2021: Conclusion: Severe LMCA and codominant RCA disease.  LAD, ramus, and LCx demonstrate mild-moderate, non-obstructive disease. Normal left ventricular filling pressure.   Recommendations: Cardiac surgery consultation for CABG. Restart IV heparin 2 hours after TR band removal. Aggressive secondary prevention of coronary artery disease.   Echo 05/07/2021: 1. Left ventricular ejection fraction, by estimation, is 55 to 60%. The  left ventricle has normal function. The left ventricle has no regional  wall motion abnormalities. Left ventricular diastolic parameters are  consistent with Grade Nathaniel Bender diastolic  dysfunction (impaired relaxation). The average left ventricular global  longitudinal strain is -20.5 %. The global longitudinal strain is normal.   2. Right ventricular systolic function is normal. The right ventricular  size is normal. There is normal pulmonary artery systolic pressure. The  estimated right ventricular systolic pressure is 16.1 mmHg.   3. The mitral valve is normal in structure. No evidence of mitral valve  regurgitation. No evidence of mitral stenosis.   4. The aortic valve is tricuspid. There is mild calcification of the  aortic valve. Aortic valve regurgitation is not visualized. Aortic valve  sclerosis is present, with no evidence of aortic valve stenosis.   5. The inferior vena cava is  normal in size with greater than 50%  respiratory variability, suggesting right atrial pressure of 3 mmHg.   6. The patient was markedly bradycardic.    EKG:  EKG is personally reviewed.   10/08/21: not ordered today 05/25/21: SR at 65 bpm, PVC 07/28/18: sinus bradycardia, left axis deviation  Recent Labs: 05/06/2021: B  Natriuretic Peptide 76.9 05/13/2021: Magnesium 2.0 06/15/2021: BUN 18; Creatinine, Ser 1.17; Hemoglobin 13.2; Platelets 178; Potassium 4.1; Sodium 141 10/02/2021: ALT 27  Recent Lipid Panel    Component Value Date/Time   CHOL 119 10/02/2021 0912   TRIG 119 10/02/2021 0912   HDL 44 10/02/2021 0912   CHOLHDL 2.7 10/02/2021 0912   CHOLHDL 3.4 05/06/2021 2210   VLDL 30 05/06/2021 2210   LDLCALC 54 10/02/2021 0912    Physical Exam:    VS:  BP 128/68   Pulse (!) 46   Ht '5\' 6"'$  (1.676 m)   Wt 170 lb 6.4 oz (77.3 kg)   SpO2 97%   BMI 27.50 kg/m     Wt Readings from Last 3 Encounters:  10/08/21 170 lb 6.4 oz (77.3 kg)  09/26/21 171 lb 8.3 oz (77.8 kg)  09/17/21 171 lb 8.3 oz (77.8 kg)     GEN: Well nourished, well developed in no acute distress HEENT: Normal NECK: No JVD; No carotid bruits LYMPHATICS: No lymphadenopathy CARDIAC: regular rhythm, normal S1 and S2, no murmurs, rubs, gallops. Radial and DP pulses 2+ bilaterally. RESPIRATORY:  Clear to auscultation without rales, wheezing or rhonchi  ABDOMEN: Soft, non-tender, non-distended MUSCULOSKELETAL:  No edema; No deformity  SKIN: Warm and dry NEUROLOGIC:  Alert and oriented x 3 PSYCHIATRIC:  Normal affect   ASSESSMENT:    1. Coronary artery disease involving native coronary artery of native heart without angina pectoris   2. Essential hypertension, benign   3. S/P CABG (coronary artery bypass graft)   4. Hyperlipidemia LDL goal <70   5. DOE (dyspnea on exertion)   6. PVC (premature ventricular contraction)     PLAN:    CAD s/p CABG 04/2021 Hypercholesterolemia -CABG X 4 (LIMA-LAD, reverse SVG-RI,  OM3, PDA and 62m atrial clip placement) 04/2021 -continue aspirin, atorvastatin, ezetimibe -on metoprolol -typically as he presented with NSTEMI, would recommend P2Y12, but as he is already 6 mos post CABG will not start today -[facility] will send referral today -LDL 54 recently. He has questions re: statins as an etiology for his muscle aches. Able to do standing from chair without issues today. Discussed options. After shared decision making, will continue meds for now, re-assess at follow up. Could consider changing to rosuvastatin to see if that changes symptoms. Has been on atorvastatin for several years.  Dyspnea on exertion -able to be very active, notices symptoms predominantly with hills, does not stop him -he will contact me if exercise tolerance or symptoms change  PVCs Atrial fibrillation, single event -afib at time of NSTEMI, not noted on prior monitor, no symptoms/events since. No indication for anticoagulation unless afib recurs -on metoprolol for PVCs  Hypertension: -continue amlodipine 5 mg, losartan 50 mg daily -at goal today  Cardiac risk counseling and prevention recommendations: -recommend heart healthy/Mediterranean diet, with whole grains, fruits, vegetable, fish, lean meats, nuts, and olive oil. Limit salt. -recommend moderate walking, 3-5 times/week for 30-50 minutes each session. Aim for at least 150 minutes.week. Goal should be pace of 3 miles/hours, or walking 1.5 miles in 30 minutes -recommend avoidance of tobacco products. Avoid excess alcohol.  Plan for follow up: 6 months.  Medication Adjustments/Labs and Tests Ordered: Current medicines are reviewed at length with the patient today.  Concerns regarding medicines are outlined above.  Orders Placed This Encounter  Procedures   [facility]  No orders of the defined types were  placed in this  encounter.  Patient Instructions  Medication Instructions:  Your Physician recommend you continue on your current medication as directed.    *If you need a refill on your cardiac medications before your next appointment, please call your pharmacy*   Lab Work: None ordered today   Testing/Procedures: None ordered today   Follow-Up: At [facility], you and your health needs are our priority.  As part of our continuing mission to provide you with exceptional heart care, we have created designated Provider Care Teams.  These Care Teams include your primary Cardiologist (physician) and Advanced Practice Providers (APPs -  Physician Assistants and Nurse Practitioners) who all work together to provide you with the care you need, when you need it.  We recommend signing up for the patient portal called "MyChart".  Sign up information is provided on this After Visit Summary.  MyChart is used to connect with patients for Virtual Visits (Telemedicine).  Patients are able to view lab/test results, encounter notes, upcoming appointments, etc.  Non-urgent messages can be sent to your provider as well.   To learn more about what you can do with MyChart, go to [email or web address].    Your next appointment:   6 month(s)  The format for your next appointment:   In Person  Provider:   Buford Dresser, MD              Nathaniel Bender,Nathaniel Bender,acting as a scribe for Buford Dresser, MD.,have documented all relevant documentation on the behalf of Buford Dresser, MD,as directed by  Buford Dresser, MD while in the presence of Buford Dresser, MD.  Nathaniel Bender, Buford Dresser, MD, have reviewed all documentation for this visit. The documentation on 10/08/21 for the exam, diagnosis, procedures, and orders are all accurate and complete.    Signed, Buford Dresser, MD PhD 10/08/2021 1:02 PM    [place]

## 2021-10-08 NOTE — Patient Instructions (Signed)

## 2021-11-06 ENCOUNTER — Encounter: Payer: Self-pay | Admitting: Podiatry

## 2021-11-06 ENCOUNTER — Ambulatory Visit: Payer: Medicare Other | Admitting: Podiatry

## 2021-11-06 DIAGNOSIS — M778 Other enthesopathies, not elsewhere classified: Secondary | ICD-10-CM | POA: Diagnosis not present

## 2021-11-06 MED ORDER — TRIAMCINOLONE ACETONIDE 40 MG/ML IJ SUSP
20.0000 mg | Freq: Once | INTRAMUSCULAR | Status: AC
  Administered 2021-11-06: 20 mg

## 2021-11-06 NOTE — Progress Notes (Signed)
He states that this spot overhears feeling much better as he refers to the lateral aspect of the left foot around the fourth fifth tarsometatarsal joints.  Been using the orthotics went to [place] the other week and my second knuckle on the left foot really started to bother me.  Objective: Vital signs stable he is alert and oriented x3.  He has pain on end range of motion and on palpation of the second metatarsophalangeal joint of the left foot.  Most likely capsulitis.  Assessment: Capsulitis second metatarsophalangeal joint left foot resolution capsulitis fourth fifth tarsometatarsal left foot.  Plan: I injected around the joint today 20 mg Kenalog 5 mg Marcaine point maximal tenderness left foot.  He is going to continue the use of his orthotics should this fail to alleviate his symptoms then we will consider MRI.

## 2021-11-08 ENCOUNTER — Ambulatory Visit: Payer: Medicare Other | Admitting: Podiatry

## 2021-11-21 ENCOUNTER — Ambulatory Visit: Payer: Medicare Other | Admitting: Physician Assistant

## 2021-12-07 ENCOUNTER — Other Ambulatory Visit (HOSPITAL_BASED_OUTPATIENT_CLINIC_OR_DEPARTMENT_OTHER): Payer: Self-pay | Admitting: Family

## 2021-12-10 NOTE — Telephone Encounter (Signed)
Rx(s) sent to pharmacy electronically.  

## 2021-12-18 ENCOUNTER — Ambulatory Visit: Payer: Medicare Other | Admitting: Podiatry

## 2021-12-18 ENCOUNTER — Encounter: Payer: Self-pay | Admitting: Podiatry

## 2021-12-18 DIAGNOSIS — S99922A Unspecified injury of left foot, initial encounter: Secondary | ICD-10-CM | POA: Diagnosis not present

## 2021-12-18 NOTE — Progress Notes (Signed)
Mr. Chew presents today chief concern of worsening pain to the second metatarsophalangeal joint of the left foot.  We have been treating him now since March for this capsulitis of the second metatarsophalangeal joint of the left foot and some most likely lateral compensatory tenderness overlying the fourth and fifth tarsometatarsal joint.  He states that it was doing some better and then he feels that wearing a pair of tennis shoes just the other day really felt severe pain that has not allowed any routine activities and is inhibiting his ability to take care of his normal health.  He states that is staying swollen and painful and that his contralateral foot is starting to become painful now as well most likely from favoring the left foot.  Objective: Vital signs are stable he is alert oriented x 3 he still has swelling on palpation of the second metatarsal phalangeal joint and pain on end range of motion.  There is considerable swelling of this area no erythema cellulitis drainage or odor third metatarsophalangeal joint is acting similarly but not nearly as sensitive as the second.  He continues to wear his orthotics in his shoes today.  Assessment: Probable plantar plate tear or chronic intra capsular synovitis capsulitis.  Plan: Discussed etiology pathology conservative surgical therapies at this point requesting MRI due to failure to alleviate this patient's symptoms.  Probable tear of the plantar plate may necessitate surgical intervention and also looking for differential diagnosis

## 2022-01-01 ENCOUNTER — Encounter: Payer: Self-pay | Admitting: Podiatry

## 2022-01-17 ENCOUNTER — Ambulatory Visit
Admission: RE | Admit: 2022-01-17 | Discharge: 2022-01-17 | Disposition: A | Discharge status: Home / Self Care | Payer: Medicare Other | Source: Ambulatory Visit | Attending: Podiatry | Admitting: Podiatry

## 2022-01-17 DIAGNOSIS — S99922A Unspecified injury of left foot, initial encounter: Secondary | ICD-10-CM

## 2022-02-07 ENCOUNTER — Telehealth: Payer: Self-pay | Admitting: *Deleted

## 2022-02-07 ENCOUNTER — Ambulatory Visit: Payer: Medicare Other | Admitting: Podiatry

## 2022-02-07 ENCOUNTER — Encounter: Payer: Self-pay | Admitting: Podiatry

## 2022-02-07 VITALS — BP 141/82 | HR 40

## 2022-02-07 DIAGNOSIS — M778 Other enthesopathies, not elsewhere classified: Secondary | ICD-10-CM | POA: Diagnosis not present

## 2022-02-07 DIAGNOSIS — S99922D Unspecified injury of left foot, subsequent encounter: Secondary | ICD-10-CM

## 2022-02-07 NOTE — Progress Notes (Signed)
He presents today for follow-up of his MRI.  He states that the foot is really the same has not changed much.  Objective: Unchanged physical exam..  MRI initial MRI demonstrates minimal findings.  Over read demonstrates a neuroma to the second interdigital space with a possible tear of the lateral aspect of the second metatarsal phalangeal joint.  Assessment: Capsulitis with possible tear.  Plan: Provided him with all of the pertinent information regarding of all different treatment plans he understands and is amenable to this will hold off on any of this at the moment will notify me for possible plantar injection

## 2022-02-07 NOTE — Telephone Encounter (Signed)
Spoke with Dr Elwyn Lade office , received the referral, doctor will review (usually takes about a week), wanted to let physician know that they are no longer doing meds, prescriptions but injections along w/ Physical Therapy as well.

## 2022-02-21 ENCOUNTER — Ambulatory Visit: Payer: Medicare Other | Admitting: Sports Medicine

## 2022-02-21 VITALS — BP 120/76 | Ht 67.0 in | Wt 165.0 lb

## 2022-02-21 DIAGNOSIS — M7741 Metatarsalgia, right foot: Secondary | ICD-10-CM | POA: Diagnosis not present

## 2022-02-21 DIAGNOSIS — M7742 Metatarsalgia, left foot: Secondary | ICD-10-CM | POA: Diagnosis not present

## 2022-02-21 NOTE — Progress Notes (Signed)
   Subjective:   HPI: Patient is a 72 y.o. male here for bilateral foot pain located just above balls of feet for over a year after hiking on a [place] area. Follow by Dr. Scherrie November with podiatry, was being treated for capsulitis with steroid injections in both feet, last in Oct 2023 which helped minimally. Has been wearing orthotic inserts for several months. He has tried topicals and occasional ibuprofen also with minimal relief. Pain worsened with walking/pressure on feet.  MRI 01/17/22 showing dropped metatarsals bilaterally but no serious structural injury.  Trying custom orthotics but not much relief with these.  They look well made but no MT support.  Note he tried using MT pads with some relief before starting with podiatry.  BP 120/76   Ht '5\' 7"'$  (1.702 m)   Wt 165 lb (74.8 kg)   BMI 25.84 kg/m        Objective:  Physical Exam:  Gen: NAD, comfortable in exam room MSK: Significant dropped metatarsals bilaterally.  Flattening of transverse arch at plantar plate No pain with palpation of balls of feet bilaterally or his MtPs or his sessamoids No abnormal calluses Long arch is moderate  5/5 muscle strength of BLEs.    Assessment & Plan:    Bilateral foot Pain Likely related to dropped metarsals bilaterally. Metatarsal pads added to shoe inserts. Pt to return in a few months if pain is not improved with inserts for further evaluation and possibly newer type of orthotic  I observed and examined the patient with the resident and agree with assessment and plan.  Note reviewed and modified by me. Ila Mcgill, MD

## 2022-02-21 NOTE — Assessment & Plan Note (Signed)
I think this is biomechanical and not structural damage  We place MT pads onto orthotics He walked with some reduction of sxs Trial of these and he should see some gradual reduction in pain  If necessary convert to supercell forefoot padded orthotic

## 2022-04-15 ENCOUNTER — Ambulatory Visit (HOSPITAL_BASED_OUTPATIENT_CLINIC_OR_DEPARTMENT_OTHER): Payer: Medicare Other | Admitting: Cardiology

## 2022-06-08 ENCOUNTER — Other Ambulatory Visit (HOSPITAL_BASED_OUTPATIENT_CLINIC_OR_DEPARTMENT_OTHER): Payer: Self-pay | Admitting: Family

## 2022-06-08 ENCOUNTER — Other Ambulatory Visit (HOSPITAL_BASED_OUTPATIENT_CLINIC_OR_DEPARTMENT_OTHER): Payer: Self-pay | Admitting: Cardiology

## 2022-06-10 NOTE — Telephone Encounter (Signed)
Rx(s) sent to pharmacy electronically.  

## 2022-06-13 ENCOUNTER — Other Ambulatory Visit (HOSPITAL_BASED_OUTPATIENT_CLINIC_OR_DEPARTMENT_OTHER): Payer: Self-pay | Admitting: Family

## 2022-08-12 ENCOUNTER — Ambulatory Visit (HOSPITAL_BASED_OUTPATIENT_CLINIC_OR_DEPARTMENT_OTHER): Payer: Medicare Other | Admitting: Cardiology

## 2022-08-19 ENCOUNTER — Encounter (HOSPITAL_BASED_OUTPATIENT_CLINIC_OR_DEPARTMENT_OTHER): Payer: Self-pay | Admitting: Family

## 2022-08-19 ENCOUNTER — Ambulatory Visit (INDEPENDENT_AMBULATORY_CARE_PROVIDER_SITE_OTHER): Payer: Medicare Other | Admitting: Internal Medicine

## 2022-08-19 ENCOUNTER — Ambulatory Visit (HOSPITAL_BASED_OUTPATIENT_CLINIC_OR_DEPARTMENT_OTHER): Payer: Medicare Other | Admitting: Family

## 2022-08-19 ENCOUNTER — Encounter: Payer: Self-pay | Admitting: Internal Medicine

## 2022-08-19 VITALS — BP 128/72 | HR 49 | Ht 67.0 in | Wt 168.0 lb

## 2022-08-19 VITALS — BP 139/69 | HR 42 | Temp 97.7°F | Ht 67.0 in | Wt 170.6 lb

## 2022-08-19 DIAGNOSIS — K219 Gastro-esophageal reflux disease without esophagitis: Secondary | ICD-10-CM

## 2022-08-19 DIAGNOSIS — R0609 Other forms of dyspnea: Secondary | ICD-10-CM | POA: Diagnosis not present

## 2022-08-19 DIAGNOSIS — I10 Essential (primary) hypertension: Secondary | ICD-10-CM

## 2022-08-19 DIAGNOSIS — G5763 Lesion of plantar nerve, bilateral lower limbs: Secondary | ICD-10-CM | POA: Insufficient documentation

## 2022-08-19 DIAGNOSIS — E785 Hyperlipidemia, unspecified: Secondary | ICD-10-CM | POA: Diagnosis not present

## 2022-08-19 DIAGNOSIS — R7302 Impaired glucose tolerance (oral): Secondary | ICD-10-CM

## 2022-08-19 DIAGNOSIS — I2510 Atherosclerotic heart disease of native coronary artery without angina pectoris: Secondary | ICD-10-CM

## 2022-08-19 DIAGNOSIS — Z951 Presence of aortocoronary bypass graft: Secondary | ICD-10-CM

## 2022-08-19 DIAGNOSIS — N521 Erectile dysfunction due to diseases classified elsewhere: Secondary | ICD-10-CM | POA: Diagnosis not present

## 2022-08-19 DIAGNOSIS — E7800 Pure hypercholesterolemia, unspecified: Secondary | ICD-10-CM

## 2022-08-19 DIAGNOSIS — I509 Heart failure, unspecified: Secondary | ICD-10-CM

## 2022-08-19 DIAGNOSIS — E291 Testicular hypofunction: Secondary | ICD-10-CM

## 2022-08-19 DIAGNOSIS — R7303 Prediabetes: Secondary | ICD-10-CM

## 2022-08-19 DIAGNOSIS — M7741 Metatarsalgia, right foot: Secondary | ICD-10-CM

## 2022-08-19 DIAGNOSIS — I252 Old myocardial infarction: Secondary | ICD-10-CM

## 2022-08-19 DIAGNOSIS — I6523 Occlusion and stenosis of bilateral carotid arteries: Secondary | ICD-10-CM | POA: Diagnosis not present

## 2022-08-19 DIAGNOSIS — L709 Acne, unspecified: Secondary | ICD-10-CM | POA: Diagnosis not present

## 2022-08-19 MED ORDER — PEPCID COMPLETE 10-800-165 MG PO CHEW: 1.0000 | CHEWABLE_TABLET | Freq: Two times a day (BID) | ORAL | 11 refills | Status: AC | PRN

## 2022-08-19 MED ORDER — OMEPRAZOLE 10 MG PO CPDR: 10.0000 mg | DELAYED_RELEASE_CAPSULE | Freq: Every day | ORAL | 3 refills | Status: DC

## 2022-08-19 MED ORDER — METOPROLOL SUCCINATE ER 25 MG PO TB24: 25.0000 mg | ORAL_TABLET | Freq: Every day | ORAL | 1 refills | Status: DC

## 2022-08-19 MED ORDER — TADALAFIL 20 MG PO TABS: 10.0000 mg | ORAL_TABLET | Freq: Every day | ORAL | 3 refills | Status: DC | PRN

## 2022-08-19 NOTE — Patient Instructions (Signed)
Medication Instructions:  Your physician has recommended you make the following change in your medication:   Stop: metoprolol tartrate   Start: Metoprolol succinate 25mg  daily   *If you need a refill on your cardiac medications before your next appointment, please call your pharmacy*   Testing/Procedures: Your physician has requested that you have a carotid duplex. This test is an ultrasound of the carotid arteries in your neck. It looks at blood flow through these arteries that supply the brain with blood. Allow one hour for this exam. There are no restrictions or special instructions.  Follow-Up: At [facility], you and your health needs are our priority.  As part of our continuing mission to provide you with exceptional heart care, we have created designated Provider Care Teams.  These Care Teams include your primary Cardiologist (physician) and Advanced Practice Providers (APPs -  Physician Assistants and Nurse Practitioners) who all work together to provide you with the care you need, when you need it.  We recommend signing up for the patient portal called "MyChart".  Sign up information is provided on this After Visit Summary.  MyChart is used to connect with patients for Virtual Visits (Telemedicine).  Patients are able to view lab/test results, encounter notes, upcoming appointments, etc.  Non-urgent messages can be sent to your provider as well.   To learn more about what you can do with MyChart, go to [email or web address].    Your next appointment:   1 year with Dr. Cristal Deer   Other Instructions We have referred you to Dermatology!

## 2022-08-19 NOTE — Progress Notes (Signed)
Cardiology Office Note:  .   Date:  08/19/2022  ID:  Nathaniel Bender., DOB 1950-03-27, MRN [id number] PCP: Lula Olszewski, MD  [facility] Providers Cardiologist:  Jodelle Red, MD    History of Present Illness: .   Nathaniel Bender. is a 72 y.o. male  with a hx of CAD s/p CABG X 4 (LIMA-LAD, reverse SVG-RI, OM3, PDA and 45mm atrial clip placement) 04/2021, prediabetes, arthritis, atrial fibrillation, hyperlipidemia.   Prior evaluation July 2020 by Dr. Cristal Deer due to bradycardia.  Monitor at that time revealed average heart rate 57 bpm, brief episodes of SVT which were asymptomatic.  He was recommended to follow-up as needed.   Admitted 4/16 - 05/13/2021 after presenting with chest pain and NSTEMI.  In the ED he was found to be in atrial fibrillation with RVR.  LHC severe multivessel disease with left main disease.  Echo 05/06/2021 LVEF 55 to 60%, grade 1 diastolic dysfunction, no RWMA, normal PASP, mild calcification of aortic valve without stenosis.  Subsequently underwent CABG X4 with LIMA-LAD, reverse SVG-RI, OM 3, PDA and 45 mm atrial clip placement by Dr. Cliffton Asters.    Last seen 10/18/2021.  He noted some shortness of breath when walking dog to the woods.  He was participating in physical therapy.  Still experiencing some left chest pain and bilateral lower extremity numbness postoperatively but no anginal symptoms.   Presents today for follow up. Very active walking his great pyranese, caring for donkeys and horses. Enjoys [employer] copy. Feeling well since last seen. Reports no shortness of breath nor dyspnea on exertion. Reports no chest pain, pressure, or tightness. No edema, orthopnea, PND. Reports no palpitations.  Still mild numbness at chest wall however LE pain and mylagias much improved.   ROS: Please see the history of present illness.    All other systems reviewed and are negative.   Studies Reviewed: .        Cardiac Studies & Procedures   CARDIAC  CATHETERIZATION  CARDIAC CATHETERIZATION 05/07/2021  Narrative Conclusion: Severe LMCA and codominant RCA disease.  LAD, ramus, and LCx demonstrate mild-moderate, non-obstructive disease. Normal left ventricular filling pressure.  Recommendations: Cardiac surgery consultation for CABG. Restart IV heparin 2 hours after TR band removal. Aggressive secondary prevention of coronary artery disease.  Yvonne Kendall, MD [facility]  Findings Coronary Findings Diagnostic  Dominance: Co-dominant  Left Main Vessel is large. Mid LM to Dist LM lesion is 90% stenosed. The lesion is eccentric. The lesion is moderately calcified.  Left Anterior Descending Vessel is moderate in size. Ost LAD to Prox LAD lesion is 30% stenosed.  Ramus Intermedius Vessel is moderate in size. Ramus-1 lesion is 40% stenosed. Ramus-2 lesion is 60% stenosed.  Left Circumflex Vessel is large. Mid Cx lesion is 60% stenosed.  First Obtuse Marginal Branch Vessel is moderate in size. There is mild disease in the vessel.  Second Obtuse Marginal Branch Vessel is small in size.  First Left Posterolateral Branch Vessel is small in size.  Second Left Posterolateral Branch Vessel is small in size.  Third Left Posterolateral Branch Vessel is large in size.  Right Coronary Artery Vessel is small. Prox RCA lesion is 99% stenosed.  Right Posterior Descending Artery Collaterals RPDA filled by collaterals from 2nd Sept.  Collaterals RPDA filled by collaterals from 1st Sept.  Intervention  No interventions have been documented.     ECHOCARDIOGRAM  ECHOCARDIOGRAM COMPLETE 05/07/2021  Narrative ECHOCARDIOGRAM REPORT    Patient Name:   Nathaniel  Bender Date of Exam: 05/07/2021 Medical Rec #:  [id number]     Height:       67.0 in Accession #:    [id number]    Weight:       162.5 lb Date of Birth:  April 29, 1950     BSA:          1.851 m Patient Age:    71 years      BP:           160/86 mmHg Patient  Gender: M             HR:           41 bpm. Exam Location:  Inpatient  Procedure: 2D Echo and Strain Analysis  Indications:    acute ischemic heart disease  History:        Patient has no prior history of Echocardiogram examinations. Signs/Symptoms:Chest Pain; Risk Factors:Hypertension and Dyslipidemia.  Sonographer:    Delcie Roch RDCS Referring Phys: [id number] MUHAMMAD S KHAN  IMPRESSIONS   1. Left ventricular ejection fraction, by estimation, is 55 to 60%. The left ventricle has normal function. The left ventricle has no regional wall motion abnormalities. Left ventricular diastolic parameters are consistent with Grade I diastolic dysfunction (impaired relaxation). The average left ventricular global longitudinal strain is -20.5 %. The global longitudinal strain is normal. 2. Right ventricular systolic function is normal. The right ventricular size is normal. There is normal pulmonary artery systolic pressure. The estimated right ventricular systolic pressure is 23.6 mmHg. 3. The mitral valve is normal in structure. No evidence of mitral valve regurgitation. No evidence of mitral stenosis. 4. The aortic valve is tricuspid. There is mild calcification of the aortic valve. Aortic valve regurgitation is not visualized. Aortic valve sclerosis is present, with no evidence of aortic valve stenosis. 5. The inferior vena cava is normal in size with greater than 50% respiratory variability, suggesting right atrial pressure of 3 mmHg. 6. The patient was markedly bradycardic.  FINDINGS Left Ventricle: Left ventricular ejection fraction, by estimation, is 55 to 60%. The left ventricle has normal function. The left ventricle has no regional wall motion abnormalities. The average left ventricular global longitudinal strain is -20.5 %. The global longitudinal strain is normal. The left ventricular internal cavity size was normal in size. There is no left ventricular hypertrophy. Left ventricular  diastolic parameters are consistent with Grade I diastolic dysfunction (impaired relaxation).  Right Ventricle: The right ventricular size is normal. No increase in right ventricular wall thickness. Right ventricular systolic function is normal. There is normal pulmonary artery systolic pressure. The tricuspid regurgitant velocity is 2.27 m/s, and with an assumed right atrial pressure of 3 mmHg, the estimated right ventricular systolic pressure is 23.6 mmHg.  Left Atrium: Left atrial size was normal in size.  Right Atrium: Right atrial size was normal in size.  Pericardium: There is no evidence of pericardial effusion.  Mitral Valve: The mitral valve is normal in structure. No evidence of mitral valve regurgitation. No evidence of mitral valve stenosis.  Tricuspid Valve: The tricuspid valve is normal in structure. Tricuspid valve regurgitation is trivial.  Aortic Valve: The aortic valve is tricuspid. There is mild calcification of the aortic valve. Aortic valve regurgitation is not visualized. Aortic valve sclerosis is present, with no evidence of aortic valve stenosis.  Pulmonic Valve: The pulmonic valve was normal in structure. Pulmonic valve regurgitation is trivial.  Aorta: The aortic root is normal in size and structure.  Venous: The inferior  vena cava is normal in size with greater than 50% respiratory variability, suggesting right atrial pressure of 3 mmHg.  IAS/Shunts: No atrial level shunt detected by color flow Doppler.   LEFT VENTRICLE PLAX 2D LVIDd:         4.20 cm   Diastology LVIDs:         2.60 cm   LV e' medial:    6.53 cm/s LV PW:         1.10 cm   LV E/e' medial:  10.3 LV IVS:        1.10 cm   LV e' lateral:   9.90 cm/s LVOT diam:     1.90 cm   LV E/e' lateral: 6.8 LV SV:         81 LV SV Index:   44        2D Longitudinal Strain LVOT Area:     2.84 cm  2D Strain GLS Avg:     -20.5 %   RIGHT VENTRICLE             IVC RV S prime:     10.00 cm/s  IVC diam:  1.70 cm TAPSE (M-mode): 2.6 cm  LEFT ATRIUM             Index        RIGHT ATRIUM           Index LA diam:        4.00 cm 2.16 cm/m   RA Area:     15.90 cm LA Vol (A2C):   56.6 ml 30.57 ml/m  RA Volume:   38.90 ml  21.01 ml/m LA Vol (A4C):   48.2 ml 26.04 ml/m LA Biplane Vol: 53.2 ml 28.74 ml/m AORTIC VALVE LVOT Vmax:   100.00 cm/s LVOT Vmean:  61.600 cm/s LVOT VTI:    0.287 m  AORTA Ao Root diam: 3.20 cm Ao Asc diam:  3.30 cm  MITRAL VALVE                TRICUSPID VALVE MV Area (PHT): 2.66 cm     TR Peak grad:   20.6 mmHg MV Decel Time: 285 msec     TR Vmax:        227.00 cm/s MV E velocity: 67.00 cm/s MV A velocity: 104.00 cm/s  SHUNTS MV E/A ratio:  0.64         Systemic VTI:  0.29 m Systemic Diam: 1.90 cm  Dalton McleanMD Electronically signed by Wilfred Lacy Signature Date/Time: 05/07/2021/3:04:44 PM    Final   TEE  ECHO INTRAOPERATIVE TEE 05/08/2021  Narrative *INTRAOPERATIVE TRANSESOPHAGEAL REPORT *    Patient Name:   Nathaniel Bender  Date of Exam: 05/08/2021 Medical Rec #:  [id number]      Height:       67.0 in Accession #:    [id number]     Weight:       163.8 lb Date of Birth:  01-May-1950      BSA:          1.86 m Patient Age:    71 years       BP:           133/82 mmHg Patient Gender: M              HR:           54 bpm. Exam Location:  Anesthesiology  Transesophogeal exam was perform intraoperatively during surgical procedure. Patient was closely monitored under  general anesthesia during the entirety of examination.  Indications:     Coronary artery disease involving native heart with angina pectoris, unspecified vessel or lesion type (HCC) [I25.119 (ICD-10-CM)] Performing Phys: [id number] Eliezer Lofts LIGHTFOOT Diagnosing Phys: Dorris Singh MD  POST-OP IMPRESSIONS _ Left Ventricle: Post Bypass: The patient came off bypass on the initial attempt. Left ventricular contraction did improve with volume replacement.The TEE that had been placed  uneventfully after induction was removed at the end of the case without difficulty. The patient was later taken to the SICU in stable conditon.  PRE-OP FINDINGS Left Ventricle: The left ventricle has low normal systolic function, with an ejection fraction of 50-55%.   Right Ventricle: The right ventricle has normal systolic function.  Left Atrium: Left atrial size was normal in size. No left atrial/left atrial appendage thrombus was detected.  Mitral Valve: The mitral valve is normal in structure. Mitral valve regurgitation is trivial by color flow Doppler.  Tricuspid Valve: The tricuspid valve was normal in structure.  Aortic Valve: The aortic valve is tricuspid.    +-------------+-------++ AORTA                +-------------+-------++ Ao Root diam:3.30 cm +-------------+-------++   Dorris Singh MD Electronically signed by Dorris Singh MD Signature Date/Time: 05/09/2021/5:25:57 PM   Final   MONITORS  LONG TERM MONITOR (3-14 DAYS) 08/20/2018  Narrative 4 days of data recorded on Zio monitor. Patient had a min HR of 36 bpm, max HR of 141 bpm, and avg HR of 57 bpm. Predominant underlying rhythm was Sinus Rhythm. No VT, atrial fibrillation, high degree block, or pauses noted. 6 Supraventricular Tachycardia runs occurred, the run with the fastest interval lasting 5 beats with a max rate of 141 bpm, the longest lasting 8 beats with an avg rate of 116 bpm. Isolated atrial ectopy was rare (<1%). Isolated ventricular ectopy was occasional (2.3%). There were 2 patient triggered events. These were sinus with PVCs.           Risk Assessment/Calculations:             Physical Exam:   VS:  BP 128/72   Pulse (!) 49   Ht 5\' 7"  (1.702 m)   Wt 168 lb (76.2 kg)   BMI 26.31 kg/m    Wt Readings from Last 3 Encounters:  08/19/22 170 lb 9.6 oz (77.4 kg)  08/19/22 168 lb (76.2 kg)  02/21/22 165 lb (74.8 kg)    GEN: Well nourished, well developed in no acute  distress NECK: No JVD; No carotid bruits CARDIAC: RRR, no murmurs, rubs, gallops RESPIRATORY:  Clear to auscultation without rales, wheezing or rhonchi  ABDOMEN: Soft, non-tender, non-distended EXTREMITIES:  No edema; No deformity   ASSESSMENT AND PLAN: .    CAD s/p CABG -04/2021 CABG X4 due to severe coronary disease including left main. Stable with no anginal symptoms. No indication for ischemic evaluation.  GDMT includes aspirin, atorvastatin, metoprolol.Recommend aiming for 150 minutes of moderate intensity activity per week and following a heart healthy diet.     Carotid artery disease -04/2021 carotid duplex bilateral 1 to 39% stenosis.  Continue aspirin, statin.  Update carotid duplex.   HLD, LDL goal less than 70 - 10/02/21 LDL 54. Continue atorvastatin 80 mg daily, Zetia 10mg  daily..  Denies myalgias.   HTN - BP well controlled. Continue current antihypertensive regimen of losartan 50 mg daily, amlodipine 5 mg daily.  Acne  - Notes acne to top of head not fully  resolved with clindamycin. Requests referral to dermatology, placed today.    GERD -controlled on omeprazole 20 mg daily.   Atrial fib -isolated episode in the setting of presentation with NSTEMI.  No indication for anticoagulation at this time.   Bradycardia -Prior history.  EKG today SB 49 bpm. Stop Metoprolol Tartrate. Start Metoprolol Succinate 25mg  daily. Can further reduce if bradycardia persists.         Dispo: follow up in 1 year  Signed, Alver Sorrow, NP

## 2022-08-19 NOTE — Assessment & Plan Note (Addendum)
Following with podiatry ,  we reviewed artificial intelligence suggestions- perhaps alcohol sclerosing injection(s)  Through a comprehensive analysis of anonymized patient data, we informed an AI-generated report. All AI suggestions underwent careful evaluation against established guidelines to ensure optimal patient care. During our collaborative review of the report's findings, we discussed potential diagnoses, recommended tests, treatment options, and next steps. I explained the rationale behind the AI recommendations, integrating my clinical judgment for the patient's understanding.  In collaboration with the patient, we explored the potential benefits and risks associated with certain categories of AI-suggested interventions. For example, we discussed the advantages and considerations of more aggressive treatment options. Ultimately, the patient elected to closely monitor the situation based on their preferences and our shared understanding of their condition. Patient's best interests and evidence-based practices guided all decisions, respecting their autonomy. Moving forward, we will continue close monitoring and revisit these options at the next visit or sooner if their condition changes. The patient was encouraged to keep me informed of any developments."  AI-Generated Report (red):    Assessment and Plan  Clinical Problem Representation: A patient with a history of hiking-related foot trauma presents with persistent pain localized to the forefoot, diagnosed as Morton's neuroma. Despite conservative management including orthotics and steroid injections, symptoms have persisted, leading to a significant impact on quality of life. Physical examination findings suggestive of Morton's neuroma include loss of the transverse arch and dropped metatarsal heads. The patient is hesitant about invasive surgical options.  Differential Diagnosis (DDx): - Primary Diagnosis: Morton's Neuroma - Confirmed by  clinical symptoms and examination findings. - Metatarsalgia - Considered due to overlapping symptoms but less likely given the focal nature of the pain and response to localized treatment. - Stress Fracture - Less likely given the chronic nature and specific location of the pain. - Plantar Fasciitis - Typically presents with heel pain, making it less likely in this case.   Morton's Neuroma - Assessment: The patient's clinical presentation is consistent with Morton's neuroma, characterized by severe pain exacerbated by weight-bearing activities. The failure of conservative management has brought surgical intervention into consideration. - Dx: Further imaging, such as an MRI or ultrasound of the foot, may be beneficial to assess the severity and exact location of the neuroma before deciding on surgical intervention. - Tx:    - Non-Surgical Management Re-evaluation: Re-assess the fit and efficacy of current orthotics. Consider the addition of a metatarsal pad if not already in use.   - Alcohol Sclerosing Injections: Could be considered as an intermediate step before surgery.   - Surgical Consultation: Discuss the potential benefits and risks of surgical removal of the neuroma, considering the patient's hesitancy and the impact on quality of life. - Monitoring and Follow-Up: Regular follow-up to assess pain management and response to any new interventions. Adjust treatment based on efficacy and patient comfort.   Pain Management - Assessment: Chronic pain management is crucial, considering the impact on the patient's daily activities and mental health. - Dx: Regular assessment of pain levels and functional status. - Tx:    - Medication Review: Consider the use of non-steroidal anti-inflammatory drugs (NSAIDs) if not contraindicated.   - Physical Therapy: Focused on strengthening and supporting the foot arch and alleviating pressure on the affected area.   - Counseling and Support: Address potential  frustration and depression associated with chronic pain. - Monitoring and Follow-Up: Evaluate the effectiveness of pain management strategies and adjust as necessary.   Foot Structure and Support - Assessment: Loss of  transverse arch and dropped metatarsal heads contribute to the neuroma's symptoms and overall foot discomfort. - Dx: Consider referral to a podiatrist for a comprehensive biomechanical evaluation. - Tx:    - Custom Orthotics: If not already prescribed, consider custom orthotic devices designed to support the arch and unload the metatarsal heads.   - Footwear Advice: Guidance on appropriate footwear that provides adequate support and space in the toe box. - Monitoring and Follow-Up: Review the structural support of the foot periodically and adjust orthotic prescriptions as needed.  This comprehensive plan aims to address the immediate concerns of pain and foot structure while considering the patient's hesitancy towards surgery. Regular follow-ups will ensure that the management plan remains effective and responsive to the patient's evolving needs.

## 2022-08-19 NOTE — Assessment & Plan Note (Signed)
Hypertension: Stable on the current regimen. We discussed potential causes of elevated blood pressure, including sleep apnea. We will continue the current antihypertensive regimen and consider a sleep study if the [employer] app indicates potential sleep apnea.

## 2022-08-19 NOTE — Patient Instructions (Addendum)
VISIT SUMMARY:  During your recent visit, we discussed your ongoing health conditions, including your heart condition, high blood pressure, erectile dysfunction, acid reflux, foot pain, and low testosterone. We also talked about your significant weight loss and your goal to lose more weight. We reviewed your current medications and discussed potential changes to better manage your conditions.  YOUR PLAN:  -HEART CONDITION (NSTEMI): Your heart condition is stable with no recent chest pain. We discussed a medication called Wegovy that could help with weight loss and heart attack prevention. We will consider this medication if you agree.  -HIGH BLOOD PRESSURE: Your blood pressure is stable on your current medication. We discussed potential causes of high blood pressure, including sleep apnea. We will continue your current medication and consider a sleep study if needed.  -ERECTILE DYSFUNCTION: Your erectile dysfunction is currently managed with as-needed medication. We discussed the potential benefits of daily dosing versus as-needed and the potential link to low testosterone. We will continue your current medication and check your testosterone levels with upcoming blood work.  -ACID REFLUX (GERD): Your acid reflux is currently managed with daily medication. We discussed potential long-term risks of this medication and alternative strategies. We will reduce your medication to 10mg  daily, consider transitioning to as-needed medication, and order a 90-day supply of your current medication.  -FOOT PAIN (MORTON'S NEUROMA): Your foot pain persists despite conservative management and steroid injections. We discussed potential surgical intervention and alternative treatments. We will consider an alcohol sclerosing injection for your foot pain.  -HIGH CHOLESTEROL (HYPERLIPIDEMIA): Your last cholesterol check was 9 months ago with good results. We will check your cholesterol with upcoming fasting blood  work.  -LOW TESTOSTERONE: You mentioned a previous diagnosis of low testosterone. We will check your testosterone levels with upcoming blood work.  INSTRUCTIONS:  We will schedule fasting blood work for cholesterol and testosterone check. Continue taking daily Aspirin for secondary prevention of heart disease. We may consider increasing Aspirin to two daily. We may also consider a sleep study if the Snorelab app indicates potential sleep apnea. Please schedule a follow-up appointment in 1 month to 1 year, as per your preference.  Welcome aboard!   Today's visit was a valuable first step in understanding your health and starting your personalized care journey. We discussed your medical history and medications in detail. Given the extensive information, we prioritized addressing your most pressing concerns.  We understood those concerns to be:  New Patient (Initial Visit)   Building a Complete Picture  To create the most effective care plan possible, we may need additional information from previous providers. We encouraged you to gather any relevant medical records for your next visit. This will help [place] build a more complete picture and develop a personalized plan together. In the meantime, we'll address your immediate concerns and provide resources to help you manage all of your medical issues.  We encourage you to use MyChart to review these efforts, and to help [place] find and correct any omissions or errors in your medical chart.  Managing Your Health Over Time  Managing every aspect of your health in a single visit isn't always feasible, but that's okay.  We addressed your most pressing concerns today and charted a course for future care. Acute conditions or preventive care measures may require further attention.  We encourage you to schedule a follow-up visit at your earliest convenience to discuss any unresolved issues.  We strongly encourage participation in annual preventive care visits to help  [place]  develop a more thorough understanding of your health and to help you maintain optimal wellness - please inquire about scheduling your next one with [place] at your earliest convenience.  Your Satisfaction Matters  It was a pleasure seeing you today!  Your health and satisfaction will always be my top priorities. If you believe your experience today was worthy of a 5-star rating, I'd be grateful for your feedback!  Nathaniel Olszewski, MD   Next Steps  Schedule Follow-Up:  We recommend a follow-up appointment in [place] in about 1 month (around 09/19/2022) for chronic disease monitoring and management. If your condition worsens before then, please call us or seek emergency care. Preventive Care:  Don't forget to schedule your annual preventive care visit!  This important checkup is typically covered by insurance and helps identify potential health issues early.  Typically its 100% insurance covered with no co-pay and helps to get surveillance labwork paid for through your insurance provider.  Sometimes it even lowers your insurance premiums to participate. Medical Information Release:  For any relevant medical information we don't have, please sign a release form so we can obtain it for your records. Lab & X-ray Appointments:  Scheduled any incomplete lab tests today or call us to schedule.  X-Rays can be done without an appointment at [facility] at [facility] ([address], [address]), M-F 8:30am-noon or 1pm-5pm.  Just tell them you're there for X-rays ordered by Dr. Jon Billings.  We'll receive the results and contact you by phone or MyChart to discuss next steps.  Bring to Your Next Appointment  Medications: Please bring all your medication bottles to your next appointment to ensure we have an accurate record of your prescriptions. Health Diaries: If you're monitoring any health conditions at home, keeping a diary of your readings can be very helpful for discussions at your next  appointment.  Reviewing Your Records  Please Review this early draft of your clinical notes below and the final encounter summary tomorrow on MyChart after its been completed.   Congestive heart failure, unspecified HF chronicity, unspecified heart failure type (HCC)  History of non-ST elevation myocardial infarction (NSTEMI) Assessment & Plan: Non-ST Elevation Myocardial Infarction (NSTEMI): Stable with no recent chest pain. We discussed the benefits of Wegovy for weight loss and heart attack prevention. We will consider Nathaniel Bender if he agrees, pending his decision via MyChart.  Following a detailed discussion about wegovy and its potential benefits and risks, the patient expressed a preference to monitor the situation for now.  He acknowledged the potential for serious health consequences of delaying this intervention.  In line with his preferences, we will continue to monitor the situation and discuss again as at the next visit or sooner if his condition changes.  I encouraged him to keep me updated on any developments. Offered call in [place] if he just lets me know by MyChart.   Orders: -     CBC with Differential/Platelet; Future -     Comprehensive metabolic panel; Future -     Lipid panel; Future -     Hemoglobin A1c; Future -     Microalbumin / creatinine urine ratio; Future  Coronary artery disease involving native coronary artery of native heart without angina pectoris  DOE (dyspnea on exertion)  Erectile dysfunction due to diseases classified elsewhere Assessment & Plan: Erectile Dysfunction: Currently managed with as-needed Tadalafil. We discussed the potential benefits of daily dosing versus as-needed and the potential link to low testosterone. We will continue  Tadalafil as needed and check testosterone levels with upcoming blood work.  Orders: -     Tadalafil; Take 0.5 tablets (10 mg total) by mouth daily as needed for erectile dysfunction.  Dispense: 90 tablet; Refill:  3  Primary hypertension Assessment & Plan: Hypertension: Stable on the current regimen. We discussed potential causes of elevated blood pressure, including sleep apnea. We will continue the current antihypertensive regimen and consider a sleep study if the [employer]orelab app indicates potential sleep apnea.   Gastroesophageal reflux disease without esophagitis Assessment & Plan: Gastroesophageal Reflux Disease (GERD): Currently managed with daily Omeprazole. We discussed potential long-term risks of Omeprazole and alternative management strategies. We will reduce Omeprazole to 10mg  daily, consider transitioning to as-needed Pepcid Complete, and order a 90-day supply of Omeprazole 10mg .  Orders: -     Pepcid Complete; Chew 1 tablet by mouth 2 (two) times daily as needed.  Dispense: 100 tablet; Refill: 11 -     Omeprazole; Take 1 capsule (10 mg total) by mouth daily.  Dispense: 100 capsule; Refill: 3  Glucose intolerance (impaired glucose tolerance)  Pure hypercholesterolemia Assessment & Plan: Hyperlipidemia: Last cholesterol check was 9 months ago with good results. We will check cholesterol with upcoming fasting blood work.   Hypogonadism in male -     Testosterone; Future  Metatarsalgia of both feet Assessment & Plan: Following with podiatry ,  we reviewed artificial intelligence suggestions- perhaps alcohol sclerosing injection(s)  Through a comprehensive analysis of anonymized patient data, we informed an AI-generated report. All AI suggestions underwent careful evaluation against established guidelines to ensure optimal patient care. During our collaborative review of the report's findings, we discussed potential diagnoses, recommended tests, treatment options, and next steps. I explained the rationale behind the AI recommendations, integrating my clinical judgment for the patient's understanding.  In collaboration with the patient, we explored the potential benefits and risks  associated with certain categories of AI-suggested interventions. For example, we discussed the advantages and considerations of more aggressive treatment options. Ultimately, the patient elected to closely monitor the situation based on their preferences and our shared understanding of their condition. Patient's best interests and evidence-based practices guided all decisions, respecting their autonomy. Moving forward, we will continue close monitoring and revisit these options at the next visit or sooner if their condition changes. The patient was encouraged to keep me informed of any developments."  AI-Generated Report (red):    Assessment and Plan  Clinical Problem Representation: A patient with a history of hiking-related foot trauma presents with persistent pain localized to the forefoot, diagnosed as Morton's neuroma. Despite conservative management including orthotics and steroid injections, symptoms have persisted, leading to a significant impact on quality of life. Physical examination findings suggestive of Morton's neuroma include loss of the transverse arch and dropped metatarsal heads. The patient is hesitant about invasive surgical options.  Differential Diagnosis (DDx): - Primary Diagnosis: Morton's Neuroma - Confirmed by clinical symptoms and examination findings. - Metatarsalgia - Considered due to overlapping symptoms but less likely given the focal nature of the pain and response to localized treatment. - Stress Fracture - Less likely given the chronic nature and specific location of the pain. - Plantar Fasciitis - Typically presents with heel pain, making it less likely in this case.   Morton's Neuroma - Assessment: The patient's clinical presentation is consistent with Morton's neuroma, characterized by severe pain exacerbated by weight-bearing activities. The failure of conservative management has brought surgical intervention into consideration. - Dx: Further imaging, such  as an MRI or ultrasound of the foot, may be beneficial to assess the severity and exact location of the neuroma before deciding on surgical intervention. - Tx:    - Non-Surgical Management Re-evaluation: Re-assess the fit and efficacy of current orthotics. Consider the addition of a metatarsal pad if not already in use.   - Alcohol Sclerosing Injections: Could be considered as an intermediate step before surgery.   - Surgical Consultation: Discuss the potential benefits and risks of surgical removal of the neuroma, considering the patient's hesitancy and the impact on quality of life. - Monitoring and Follow-Up: Regular follow-up to assess pain management and response to any new interventions. Adjust treatment based on efficacy and patient comfort.   Pain Management - Assessment: Chronic pain management is crucial, considering the impact on the patient's daily activities and mental health. - Dx: Regular assessment of pain levels and functional status. - Tx:    - Medication Review: Consider the use of non-steroidal anti-inflammatory drugs (NSAIDs) if not contraindicated.   - Physical Therapy: Focused on strengthening and supporting the foot arch and alleviating pressure on the affected area.   - Counseling and Support: Address potential frustration and depression associated with chronic pain. - Monitoring and Follow-Up: Evaluate the effectiveness of pain management strategies and adjust as necessary.   Foot Structure and Support - Assessment: Loss of transverse arch and dropped metatarsal heads contribute to the neuroma's symptoms and overall foot discomfort. - Dx: Consider referral to a podiatrist for a comprehensive biomechanical evaluation. - Tx:    - Custom Orthotics: If not already prescribed, consider custom orthotic devices designed to support the arch and unload the metatarsal heads.   - Footwear Advice: Guidance on appropriate footwear that provides adequate support and space in the toe  box. - Monitoring and Follow-Up: Review the structural support of the foot periodically and adjust orthotic prescriptions as needed.  This comprehensive plan aims to address the immediate concerns of pain and foot structure while considering the patient's hesitancy towards surgery. Regular follow-ups will ensure that the management plan remains effective and responsive to the patient's evolving needs.    Prediabetes  Morton's neuroma of both feet Assessment & Plan: Morton's Neuroma: Chronic foot pain persists despite conservative management and steroid injections. We discussed potential surgical intervention and alternative treatments. We will consider an alcohol sclerosing injection for Morton's Neuroma.      Getting Answers and Following Up  Simple Questions & Concerns: For quick questions or basic follow-up after your visit, reach [place] at [phone] or MyChart messaging. Complex Concerns: If your concern is more complex, scheduling an appointment might be best. Discuss this with the staff to find the most suitable option. Lab & Imaging Results: We'll contact you directly if results are abnormal or you don't use MyChart. Most normal results will be on MyChart within 2-3 business days, with a review message from Dr. Jon Billings. Haven't heard back in 2 weeks? Need results sooner? Contact us at [phone]. Referrals: Our referral coordinator will manage specialist referrals. The specialist's office should contact you within 2 weeks to schedule an appointment. Call us if you haven't heard from them after 2 weeks.  Staying Connected  MyChart: Activate your MyChart for the fastest way to access results and message [place]. See the last page of this paperwork for instructions.  Billing  X-ray & Lab Orders: These are billed by separate companies. Contact the invoicing company directly for questions or concerns. Visit Charges: Discuss any billing inquiries with  our administrative services  team.  Feedback & Satisfaction  Share Your Experience: We strive for your satisfaction! If you have any complaints, please let Dr. Jon Billings know directly or contact our Practice Administrators, Edwena Felty or [employer], by asking at the front desk.  Scheduling Tips  Shorter Wait Times: 8 am and 1 pm appointments often have the quickest wait times. Longer Appointments: If you need more time during your visit, talk to the front desk. Due to insurance regulations, multiple back-to-back appointments might be necessary.

## 2022-08-19 NOTE — Assessment & Plan Note (Signed)
Morton's Neuroma: Chronic foot pain persists despite conservative management and steroid injections. We discussed potential surgical intervention and alternative treatments. We will consider an alcohol sclerosing injection for Morton's Neuroma.

## 2022-08-19 NOTE — Assessment & Plan Note (Signed)
Gastroesophageal Reflux Disease (GERD): Currently managed with daily Omeprazole. We discussed potential long-term risks of Omeprazole and alternative management strategies. We will reduce Omeprazole to 10mg  daily, consider transitioning to as-needed Pepcid Complete, and order a 90-day supply of Omeprazole 10mg .

## 2022-08-19 NOTE — Assessment & Plan Note (Signed)
Hyperlipidemia: Last cholesterol check was 9 months ago with good results. We will check cholesterol with upcoming fasting blood work.

## 2022-08-19 NOTE — Progress Notes (Addendum)
[facility]  Phone: [phone]  New patient visit  Visit Date: 08/19/2022 Patient: Nathaniel Bender.   DOB: November 01, 1950   72 y.o. Male  MRN: [id number] Patient Care Team: Lula Olszewski, MD as PCP - General (Internal Medicine) Jodelle Red, MD as PCP - Cardiology (Cardiology) Glyn Ade, PA-C as Physician Assistant (Dermatology) Today's Health Care Provider: Lula Olszewski, MD   Assessment and Plan:   He had no active complaints so we just documented his problems in detail, addressing them as we discussed them.  Nathaniel Bender was seen today for new patient (initial visit).  Congestive heart failure, unspecified HF chronicity, unspecified heart failure type (HCC)  History of non-ST elevation myocardial infarction (NSTEMI) Assessment & Plan: Non-ST Elevation Myocardial Infarction (NSTEMI): Stable with no recent chest pain. We discussed the benefits of Wegovy for weight loss and heart attack prevention. We will consider Reginal Lutes if he agrees, pending his decision via MyChart.  Following a detailed discussion about wegovy and its potential benefits and risks, the patient expressed a preference to monitor the situation for now.  He acknowledged the potential for serious health consequences of delaying this intervention.  In line with his preferences, we will continue to monitor the situation and discuss again as at the next visit or sooner if his condition changes.  I encouraged him to keep me updated on any developments. Offered call in [place] if he just lets me know by MyChart.   Orders: -     CBC with Differential/Platelet; Future -     Comprehensive metabolic panel; Future -     Lipid panel; Future -     Hemoglobin A1c; Future -     Microalbumin / creatinine urine ratio; Future  Coronary artery disease involving native coronary artery of native heart without angina pectoris Overview: Cardiologist Jodelle Red, MD  09/2021 visit  hx of  CAD s/p CABG, HTN, HLD, glucose intolerance, single episode of afib at time of NSTEMI who is here for follow up. Previously seen as a new consult at the request of Nathaniel Harries, MD for the evaluation and management of bradycardia.   Cardiac history: He presented to the ED on 05/06/2021 for exertional chest pain and shortness of breath with a near syncopal episode lasting 15 minutes before resolution. The ED workup revealed Atrial fibrillation with RVR. His initial Troponin level was 31. He was admitted to the hospital. Ruled in for NSTEMI. CABG was discussed and agreed upon. Pre-operative carotid duplex US showed no significant internal carotid artery stenosis bilaterally. On 05/08/21 he underwent CABG x 4. Lopressor was increased for PVCs on 4/20. He had increasing blood pressure and was gradually resumed on his home medications, including Cozaar and Norvasc. He was discharged in stable condition and referred to cardiac rehabilitation.     EKGs/Labs/Other Studies Reviewed:     The following studies were reviewed today:   Pre-CABG Carotid Doppler 05/07/2021: Summary:  Right Carotid: Velocities in the right ICA are consistent with a 1-39%  stenosis.   Left Carotid: Velocities in the left ICA are consistent with a 1-39%  stenosis.  Vertebrals:  Bilateral vertebral arteries demonstrate antegrade flow.  Subclavians: Normal flow hemodynamics were seen in bilateral subclavian               arteries.   Right Upper Extremity: Doppler waveforms decrease 50% with right radial  compression. Doppler waveforms remain within normal limits with right  ulnar compression.  Left Upper Extremity: Doppler waveforms remain  within normal limits with  left radial compression. Doppler waveforms remain within normal limits  with left ulnar compression.     Left Heart Cath 05/07/2021: Conclusion: Severe LMCA and codominant RCA disease.  LAD, ramus, and LCx demonstrate mild-moderate, non-obstructive disease. Normal  left ventricular filling pressure.   Recommendations: Cardiac surgery consultation for CABG. Restart IV heparin 2 hours after TR band removal. Aggressive secondary prevention of coronary artery disease.    Echo 05/07/2021: 1. Left ventricular ejection fraction, by estimation, is 55 to 60%. The  left ventricle has normal function. The left ventricle has no regional  wall motion abnormalities. Left ventricular diastolic parameters are  consistent with Grade I diastolic  dysfunction (impaired relaxation). The average left ventricular global  longitudinal strain is -20.5 %. The global longitudinal strain is normal.   2. Right ventricular systolic function is normal. The right ventricular  size is normal. There is normal pulmonary artery systolic pressure. The  estimated right ventricular systolic pressure is 23.6 mmHg.   3. The mitral valve is normal in structure. No evidence of mitral valve  regurgitation. No evidence of mitral stenosis.   4. The aortic valve is tricuspid. There is mild calcification of the  aortic valve. Aortic valve regurgitation is not visualized. Aortic valve  sclerosis is present, with no evidence of aortic valve stenosis.   5. The inferior vena cava is normal in size with greater than 50%  respiratory variability, suggesting right atrial pressure of 3 mmHg.   6. The patient was markedly bradycardic.     EKG:  EKG is personally reviewed.   10/08/21: not ordered today 05/25/21: SR at 65 bpm, PVC 07/28/18: sinus bradycardia, left axis deviation   Recent Labs: 05/06/2021: B Natriuretic Peptide 76.9 05/13/2021: Magnesium 2.0 06/15/2021: BUN 18; Creatinine, Ser 1.17; Hemoglobin 13.2; Platelets 178; Potassium 4.1; Sodium 141 10/02/2021: ALT 27  Recent Lipid Panel Labs (Brief)          Component Value Date/Time    CHOL 119 10/02/2021 0912    TRIG 119 10/02/2021 0912    HDL 44 10/02/2021 0912    CHOLHDL 2.7 10/02/2021 0912    CHOLHDL 3.4 05/06/2021 2210    VLDL 30  05/06/2021 2210    LDLCALC 54 10/02/2021 0912        Physical Exam:     VS:  BP 128/68   Pulse (!) 46   Ht 5\' 6"  (1.676 m)   Wt 170 lb 6.4 oz (77.3 kg)   SpO2 97%   BMI 27.50 kg/m         Wt Readings from Last 3 Encounters:  10/08/21 170 lb 6.4 oz (77.3 kg)  09/26/21 171 lb 8.3 oz (77.8 kg)  09/17/21 171 lb 8.3 oz (77.8 kg)      GEN: Well nourished, well developed in no acute distress HEENT: Normal NECK: No JVD; No carotid bruits LYMPHATICS: No lymphadenopathy CARDIAC: regular rhythm, normal S1 and S2, no murmurs, rubs, gallops. Radial and DP pulses 2+ bilaterally. RESPIRATORY:  Clear to auscultation without rales, wheezing or rhonchi  ABDOMEN: Soft, non-tender, non-distended MUSCULOSKELETAL:  No edema; No deformity  SKIN: Warm and dry NEUROLOGIC:  Alert and oriented x 3 PSYCHIATRIC:  Normal affect    ASSESSMENT:     1. Coronary artery disease involving native coronary artery of native heart without angina pectoris   2. Essential hypertension, benign   3. S/P CABG (coronary artery bypass graft)   4. Hyperlipidemia LDL goal <70   5. DOE (dyspnea  on exertion)   6. PVC (premature ventricular contraction)       PLAN:     CAD s/p CABG 04/2021 Hypercholesterolemia -CABG X 4 (LIMA-LAD, reverse SVG-RI, OM3, PDA and 45mm atrial clip placement) 04/2021 -continue aspirin, atorvastatin, ezetimibe -on metoprolol -typically as he presented with NSTEMI, would recommend P2Y12, but as he is already 6 mos post CABG will not start today -completed cardiac rehab at [facility] -interested in [place], will send referral today -LDL 54 recently. He has questions re: statins as an etiology for his muscle aches. Able to do standing from chair without issues today. Discussed options. After shared decision making, will continue meds for now, re-assess at follow up. Could consider changing to rosuvastatin to see if that changes symptoms. Has been on atorvastatin for several years.   Dyspnea  on exertion -able to be very active, notices symptoms predominantly with hills, does not stop him -he will contact me if exercise tolerance or symptoms change   PVCs Atrial fibrillation, single event -afib at time of NSTEMI, not noted on prior monitor, no symptoms/events since. No indication for anticoagulation unless afib recurs -on metoprolol for PVCs   Hypertension: -continue amlodipine 5 mg, losartan 50 mg daily -at goal today   Cardiac risk counseling and prevention recommendations: -recommend heart healthy/Mediterranean diet, with whole grains, fruits, vegetable, fish, lean meats, nuts, and olive oil. Limit salt. -recommend moderate walking, 3-5 times/week for 30-50 minutes each session. Aim for at least 150 minutes.week. Goal should be pace of 3 miles/hours, or walking 1.5 miles in 30 minutes -recommend avoidanc   DOE (dyspnea on exertion) Overview: Psstt since nstemi- walks 9-12000 steps and no problem except going up on hill.   Erectile dysfunction due to diseases classified elsewhere Overview: As needed tadalafil Cialis.  Orders: -     Tadalafil; Take 0.5 tablets (10 mg total) by mouth daily as needed for erectile dysfunction.  Dispense: 90 tablet; Refill: 3  Primary hypertension Assessment & Plan: Interim history from August 19, 2022:    Home readings: occasional usually  good. Patient reports taking current medications consistently and not experiencing any significant associated side effects or symptoms. Current hypertension medications:       Sig   amLODipine (NORVASC) 5 MG tablet (Taking) Take 1 tablet (5 mg total) by mouth daily.   losartan (COZAAR) 50 MG tablet (Taking) Take 50 mg by mouth daily.   Metoprolol Tartrate 37.5 MG TABS (Taking) Take 2 tablets by mouth daily.   metoprolol succinate (TOPROL XL) 25 MG 24 hr tablet Take 1 tablet (25 mg total) by mouth daily.    Patient not taking: Reported on 08/19/2022   Metoprolol Tartrate 37.5 MG TABS  (Discontinued) TAKE 1 TABLET BY MOUTH TWICE A DAY      Lab Results  Component Value Date   NA 141 06/15/2021   K 4.1 06/15/2021   CREATININE 1.17 06/15/2021      Gastroesophageal reflux disease without esophagitis Overview: Uses daily omeprazole 20 mg.   Orders: -     Pepcid Complete; Chew 1 tablet by mouth 2 (two) times daily as needed.  Dispense: 100 tablet; Refill: 11 -     Omeprazole; Take 1 capsule (10 mg total) by mouth daily.  Dispense: 100 capsule; Refill: 3  Glucose intolerance (impaired glucose tolerance)  Pure hypercholesterolemia Overview: Medications: not yet discussed Lab Results  Component Value Date   HDL 44 10/02/2021   HDL 49 06/15/2021   HDL 53 05/06/2021   CHOLHDL  2.7 10/02/2021   CHOLHDL 3.2 06/15/2021   CHOLHDL 3.4 05/06/2021   Lab Results  Component Value Date   LDLCALC 54 10/02/2021   LDLCALC 83 06/15/2021   LDLCALC 95 05/06/2021   Lab Results  Component Value Date   TRIG 119 10/02/2021   TRIG 128 06/15/2021   TRIG 148 05/06/2021   Lab Results  Component Value Date   CHOL 119 10/02/2021   CHOL 155 06/15/2021   CHOL 178 05/06/2021   The ASCVD Risk score (Arnett DK, et al., 2019) failed to calculate for the following reasons:   The patient has a prior MI or stroke diagnosis Lab Results  Component Value Date   ALT 27 10/02/2021   AST 25 10/02/2021   ALKPHOS 71 10/02/2021   TSH 2.620 07/30/2019   HGBA1C 6.1 (H) 05/08/2021   Body mass index is 26.72 kg/m.  Lipoprotein(a), Apolipoprotein B (ApoB), and High-sensitivity C-reactive protein (hs-CRP) No results found for: "HSCRP", "LIPOA" Improving Your Cholesterol: Diet: Focus on a Mediterranean-style diet, limit saturated fats and sugars, and increase omega-3 fatty acids (fish, flaxseeds,nuts,extra virgin olive oil, avocados). Exercise: Engage in regular physical activity (aerobic exercises are particularly beneficial for HDL). Weight Management: Maintain a healthy weight. Smoking  Cessation: Quitting smoking improves cholesterol levels.    Hypogonadism in male Overview: Lab Results  Component Value Date   TESTOSTERONE 210 (L) 01/13/2015    Orders: -     Testosterone; Future  Metatarsalgia of both feet Overview: Prior Exam was suggestive: with loss of transverse arch Dropped MT heads noted Associated with nerve damage from hiking in mountains 2023ish, following with  foot and ankle. Has tried orthotics and and steroid injections. He was dx'd with morton's neuroma, has been offered surgery. Very painful. Wears shoes with special pads - keeping weight off helps.    MRI OF THE LEFT FOOT WITHOUT CONTRAST 01/2022  COMPARISON:  None Available.   FINDINGS: Bones/Joint/Cartilage   No fracture or dislocation. Normal alignment. Small first MTP joint effusion. Subchondral marrow edema in the lateral hallux sesamoid and adjacent plantar aspect of the first metatarsal head consistent with arthritic changes.   Joint capsules are intact.  Plantar plates are intact.   Ligaments   Collateral ligaments are intact.  Lisfranc ligament is intact.   Muscles and Tendons   Flexor, peroneal and extensor compartment tendons are intact. Muscles are normal.   Soft tissue No fluid collection or hematoma.  No soft tissue mass.   IMPRESSION: 1. No acute injury of the left foot. 2. Subchondral marrow edema in the lateral hallux sesamoid and adjacent plantar aspect of the first metatarsal head consistent with arthritic changes.  Assessment & Plan: Following with podiatry ,  we reviewed artificial intelligence suggestions- perhaps alcohol sclerosing injection(s)  Through a comprehensive analysis of anonymized patient data, we informed an AI-generated report. All AI suggestions underwent careful evaluation against established guidelines to ensure optimal patient care. During our collaborative review of the report's findings, we discussed potential diagnoses,  recommended tests, treatment options, and next steps. I explained the rationale behind the AI recommendations, integrating my clinical judgment for the patient's understanding.  In collaboration with the patient, we explored the potential benefits and risks associated with certain categories of AI-suggested interventions. For example, we discussed the advantages and considerations of more aggressive treatment options. Ultimately, the patient elected to closely monitor the situation based on their preferences and our shared understanding of their condition. Patient's best interests and evidence-based practices guided all decisions, respecting  their autonomy. Moving forward, we will continue close monitoring and revisit these options at the next visit or sooner if their condition changes. The patient was encouraged to keep me informed of any developments."  AI-Generated Report (red):    Assessment and Plan  Clinical Problem Representation: A patient with a history of hiking-related foot trauma presents with persistent pain localized to the forefoot, diagnosed as Morton's neuroma. Despite conservative management including orthotics and steroid injections, symptoms have persisted, leading to a significant impact on quality of life. Physical examination findings suggestive of Morton's neuroma include loss of the transverse arch and dropped metatarsal heads. The patient is hesitant about invasive surgical options.  Differential Diagnosis (DDx): - Primary Diagnosis: Morton's Neuroma - Confirmed by clinical symptoms and examination findings. - Metatarsalgia - Considered due to overlapping symptoms but less likely given the focal nature of the pain and response to localized treatment. - Stress Fracture - Less likely given the chronic nature and specific location of the pain. - Plantar Fasciitis - Typically presents with heel pain, making it less likely in this case.   Morton's Neuroma - Assessment: The  patient's clinical presentation is consistent with Morton's neuroma, characterized by severe pain exacerbated by weight-bearing activities. The failure of conservative management has brought surgical intervention into consideration. - Dx: Further imaging, such as an MRI or ultrasound of the foot, may be beneficial to assess the severity and exact location of the neuroma before deciding on surgical intervention. - Tx:    - Non-Surgical Management Re-evaluation: Re-assess the fit and efficacy of current orthotics. Consider the addition of a metatarsal pad if not already in use.   - Alcohol Sclerosing Injections: Could be considered as an intermediate step before surgery.   - Surgical Consultation: Discuss the potential benefits and risks of surgical removal of the neuroma, considering the patient's hesitancy and the impact on quality of life. - Monitoring and Follow-Up: Regular follow-up to assess pain management and response to any new interventions. Adjust treatment based on efficacy and patient comfort.   Pain Management - Assessment: Chronic pain management is crucial, considering the impact on the patient's daily activities and mental health. - Dx: Regular assessment of pain levels and functional status. - Tx:    - Medication Review: Consider the use of non-steroidal anti-inflammatory drugs (NSAIDs) if not contraindicated.   - Physical Therapy: Focused on strengthening and supporting the foot arch and alleviating pressure on the affected area.   - Counseling and Support: Address potential frustration and depression associated with chronic pain. - Monitoring and Follow-Up: Evaluate the effectiveness of pain management strategies and adjust as necessary.   Foot Structure and Support - Assessment: Loss of transverse arch and dropped metatarsal heads contribute to the neuroma's symptoms and overall foot discomfort. - Dx: Consider referral to a podiatrist for a comprehensive biomechanical  evaluation. - Tx:    - Custom Orthotics: If not already prescribed, consider custom orthotic devices designed to support the arch and unload the metatarsal heads.   - Footwear Advice: Guidance on appropriate footwear that provides adequate support and space in the toe box. - Monitoring and Follow-Up: Review the structural support of the foot periodically and adjust orthotic prescriptions as needed.  This comprehensive plan aims to address the immediate concerns of pain and foot structure while considering the patient's hesitancy towards surgery. Regular follow-ups will ensure that the management plan remains effective and responsive to the patient's evolving needs.    Prediabetes Overview: >>OVERVIEW FOR GLUCOSE INTOLERANCE (IMPAIRED GLUCOSE  TOLERANCE) WRITTEN ON 08/19/2022  2:21 PM BY Lula Olszewski, MD  Lab Results  Component Value Date   HGBA1C 6.1 (H) 05/08/2021      Morton's neuroma of both feet Overview:  reports a history of Morton's neuroma, resulting from a hiking incident approximately two years ago. Despite various treatments, including orthotics and steroid injections, the patient continues to experience significant foot pain, likened to having "a couple of dimes taped to the bottom of my feet." The pain is alleviated when weight is removed from the affected area and when wearing shoes with specific pads.     General Health Maintenance: We will schedule fasting blood work for cholesterol and testosterone check, continue daily Aspirin for secondary prevention of heart disease, consider increasing Aspirin to two daily, consider a sleep study if the Snore[employer] app indicates potential sleep apnea, and schedule a follow-up appointment in 1 month to 1 year, per his preference.             Clinical Presentation:    72 y.o. male for whom we understood the purpose of our visit (chief concerns/complaints) to be New Patient (Initial Visit)  Discussed the use of AI scribe software  for clinical note transcription with the patient, who gave verbal consent to proceed.  History of Present Illness   The patient, with a history of non-ST elevation myocardial infarction (NSTEMI) and prediabetes, presents for a follow-up consultation. The patient reports a significant weight loss.  He reports a daily walking routine of 9,000 to 12,000 steps, with occasional shortness of breath when climbing steep hills.  The patient also reports a history of erectile dysfunction, managed with as-needed Tadalafil, but notes a significant change in blood pressure post-medication. He expresses a preference for the as-needed approach due to these side effects.  Additionally, the patient has been managing gastroesophageal reflux disease (GERD) with daily Omeprazole 20mg , but expresses concerns about long-term use due to potential links to dementia and chronic kidney disease.  The patient also reports a history of Morton's neuroma, resulting from a hiking incident approximately two years ago. Despite various treatments, including orthotics and steroid injections, the patient continues to experience significant foot pain, likened to having "a couple of dimes taped to the bottom of my feet." The pain is alleviated when weight is removed from the affected area and when wearing shoes with specific pads.  Lastly, the patient mentions a previous diagnosis of low testosterone from eight years ago, but has not pursued treatment due to potential risks of heart attack. He expresses a willingness to consider treatment if approved by his cardiologist.      Problems: has Hypertension; Hyperlipidemia; Gastroesophageal reflux disease without esophagitis; Erectile dysfunction due to diseases classified elsewhere; Hypogonadism in male; DOE (dyspnea on exertion); History of non-ST elevation myocardial infarction (NSTEMI); S/P CABG x 4; Coronary artery disease; Prediabetes; PVC (premature ventricular contraction);  Metatarsalgia of both feet; and Morton's neuroma of both feet on their problem list. Current Meds  Medication Sig   amLODipine (NORVASC) 5 MG tablet Take 1 tablet (5 mg total) by mouth daily.   aspirin (ASPIRIN 81) 81 MG chewable tablet Chew 81 mg by mouth daily.   atorvastatin (LIPITOR) 80 MG tablet TAKE 1 TABLET BY MOUTH EVERY DAY   clindamycin (CLEOCIN T) 1 % external solution As needed.   ezetimibe (ZETIA) 10 MG tablet Take 1 tablet (10 mg total) by mouth daily.   famotidine-calcium carbonate-magnesium hydroxide (PEPCID COMPLETE) 10-800-165 MG chewable tablet Chew 1 tablet by  mouth 2 (two) times daily as needed.   Ibuprofen-diphenhydrAMINE Cit (MOTRIN PM PO) Take by mouth.   losartan (COZAAR) 50 MG tablet Take 50 mg by mouth daily.   Metoprolol Tartrate 37.5 MG TABS Take 2 tablets by mouth daily.   naproxen sodium (ALEVE) 220 MG tablet Take 220 mg by mouth daily as needed (pain.).   omeprazole (PRILOSEC) 10 MG capsule Take 1 capsule (10 mg total) by mouth daily.   tadalafil (CIALIS) 20 MG tablet Take 0.5 tablets (10 mg total) by mouth daily as needed for erectile dysfunction.   Turmeric (QC TUMERIC COMPLEX PO) Take by mouth daily.   [DISCONTINUED] omeprazole (PRILOSEC) 20 MG capsule Take 1 capsule (20 mg total) by mouth daily.   Allergies:  No Known Allergies Past Medical History:  has a past medical history of Acute respiratory failure with hypoxia (HCC), Arthritis, Atypical mole (05/04/2013), Chest pain (05/06/2021), Colon polyps, Gout, Hyperlipidemia, Hypertension, Meniere's disease of left ear, and Sudden idiopathic hearing loss (11/20/2011). Past Surgical History:   has a past surgical history that includes Tonsillectomy; Rotator cuff repair (Right); LEFT HEART CATH AND CORONARY ANGIOGRAPHY (N/A, 05/07/2021); Coronary artery bypass graft (N/A, 05/08/2021); TEE without cardioversion (05/08/2021); Endoharvest vein of greater saphenous vein (Bilateral, 05/08/2021); and Clipping of atrial  appendage (Left, 05/08/2021). Social History:   reports that he has never smoked. He has never used smokeless tobacco. He reports current alcohol use of about 12.0 standard drinks of alcohol per week. He reports that he does not currently use drugs after having used the following drugs: Cocaine, Marijuana, and LSD. Family History:  family history includes Arthritis in his maternal grandmother, paternal grandmother, and sister; Arthritis (age of onset: 42) in his mother; Diabetes in his father; Heart disease in his maternal grandfather and paternal grandfather; Heart disease (age of onset: 29) in his father; Hyperlipidemia in his father, mother, and sister; Hypertension in his father, mother, and sister; Mental illness in his paternal grandmother; Stroke in his paternal grandfather.  Depression Screen and Health Maintenance:    08/19/2022    1:30 PM 07/02/2021   12:39 PM 08/06/2019   10:39 AM 02/05/2019    1:43 PM  PHQ 2/9 Scores  PHQ - 2 Score 0 0 0 0  PHQ- 9 Score  4     Health Maintenance  Topic Date Due   Zoster Vaccines- Shingrix (1 of 2) 02/07/1969   DTaP/Tdap/Td (2 - Tdap) 01/21/2018   Pneumonia Vaccine 68+ Years old (2 of 2 - PPSV23 or PCV20) 06/03/2018   Medicare Annual Wellness (AWV)  04/19/2021   COVID-19 Vaccine (5 - 2023-24 season) 10/21/2022 (Originally 09/21/2021)   INFLUENZA VACCINE  08/22/2022   Colonoscopy  04/17/2024   Hepatitis C Screening  Completed   HPV VACCINES  Aged Out   Immunization History  Administered Date(s) Administered   Influenza Split 11/10/2011, 10/21/2012, 01/20/2016   Influenza, High Dose Seasonal PF 01/01/2018, 11/30/2018   Influenza,inj,Quad PF,6+ Mos 12/29/2013, 01/13/2015, 12/16/2016   Influenza-Unspecified 02/07/2020   PFIZER(Purple Top)SARS-COV-2 Vaccination 02/10/2019, 03/01/2019, 09/30/2019, 06/10/2020   Pneumococcal Conjugate-13 06/02/2017   Td 01/22/2008   Zoster, Live 01/21/2010        Objective:  Physical Exam  BP 139/69 (BP  Location: Left Arm, Patient Position: Sitting)   Pulse (!) 42 Comment: was taking medication from Cardiologist that slows heart rate, has stopped  Temp 97.7 F (36.5 C) (Temporal)   Ht 5\' 7"  (1.702 m)   Wt 170 lb 9.6 oz (77.4 kg)   SpO2  97%   BMI 26.72 kg/m  Wt Readings from Last 10 Encounters:  08/19/22 170 lb 9.6 oz (77.4 kg)  08/19/22 168 lb (76.2 kg)  02/21/22 165 lb (74.8 kg)  10/08/21 170 lb 6.4 oz (77.3 kg)  09/26/21 171 lb 8.3 oz (77.8 kg)  09/17/21 171 lb 8.3 oz (77.8 kg)  08/20/21 168 lb 14 oz (76.6 kg)  08/06/21 166 lb 14.2 oz (75.7 kg)  07/23/21 168 lb 6.9 oz (76.4 kg)  07/09/21 165 lb 5.5 oz (75 kg)   Vital signs reviewed.  Nursing notes reviewed. Weight trend reviewed. Abnormalities and problem-specific physical exam findings:  truncal adiposity  General Appearance:  Well developed, well nourished, well-groomed, healthy-appearing male with Body mass index is 26.72 kg/m. No acute distress appreciable.   Skin: Clear and well-hydrated. Pulmonary:  Normal work of breathing at rest, no respiratory distress apparent. SpO2: 97 %  Musculoskeletal: He demonstrates smooth and coordinated movements throughout all major joints.All extremities are intact.  Neurological:  Awake, alert, oriented, and engaged.  No obvious focal neurological deficits or cognitive impairments.  Sensorium seems unclouded.  Psychiatric:  Appropriate mood, pleasant and cooperative demeanor, cheerful and engaged during the exam  Reviewed Results      Results for orders placed or performed in visit on 06/19/21  Lipid panel  Result Value Ref Range   Cholesterol, Total 119 100 - 199 mg/dL   Triglycerides 409 0 - 149 mg/dL   HDL 44 >81 mg/dL   VLDL Cholesterol Cal 21 5 - 40 mg/dL   LDL Chol Calc (NIH) 54 0 - 99 mg/dL   Chol/HDL Ratio 2.7 0.0 - 5.0 ratio  Hepatic function panel  Result Value Ref Range   Total Protein 6.8 6.0 - 8.5 g/dL   Albumin 4.8 3.9 - 4.9 g/dL   Bilirubin Total 0.8 0.0 - 1.2  mg/dL   Bilirubin, Direct 1.91 0.00 - 0.40 mg/dL   Alkaline Phosphatase 71 44 - 121 IU/L   AST 25 0 - 40 IU/L   ALT 27 0 - 44 IU/L    No results found for any visits on 08/19/22.  No results found for this or any previous visit (from the past 2160 hour(s)).  No image results found.   No results found.      Additional notes: Initial Appointment Goals:  This initial visit focused on establishing a foundation for the patient's care. We collaboratively reviewed his medical history and medications in detail, updating the chart as shown in the encounter. Given the extensive information, we prioritized addressing his most pressing concerns, which he reported were: New Patient (Initial Visit)  While the complexity of the patient's medical picture may necessitate further evaluation in subsequent visits, we were able to develop a preliminary care plan together. To expedite a comprehensive plan at the next visit, we encouraged the patient to gather relevant medical records from previous providers. This collaborative approach will ensure a more complete understanding of the patient's health and inform the development of a personalized care plan. We look forward to continuing the conversation and working together with the patient on achieving his health goals.   Collaborative Documentation:  Today's encounter utilized real-time, dynamic patient engagement.  Patients actively participate by directly reviewing and assisting in updating their medical records through a shared screen. This transparency empowers patients to visually confirm chart updates made by the healthcare provider.  This collaborative approach facilitates problem management as we jointly update the problem list, problem overview, and assessment/plan. Ultimately, this  process enhances chart accuracy and completeness, fostering shared decision-making, patient education, and informed consent for tests and treatments.  Collaborative Treatment  Planning:  Treatment plans were discussed and reviewed in detail.  Explained medication safety and potential side effects.  Encouraged participation and answered all patient questions, confirming understanding and comfort with the plan. Encouraged patient to contact our office if they have any questions or concerns. Agreed on patient returning to office if symptoms worsen, persist, or new symptoms develop. Discussed precautions in case of needing to visit the Emergency Department.  ----------------------------------------------------- Lula Olszewski, MD  08/19/2022 7:10 PM  [facility]:  [phone]

## 2022-08-19 NOTE — Assessment & Plan Note (Addendum)
Non-ST Elevation Myocardial Infarction (NSTEMI): Stable with no recent chest pain. We discussed the benefits of Wegovy for weight loss and heart attack prevention. We will consider Reginal Lutes if he agrees, pending his decision via MyChart.  Following a detailed discussion about wegovy and its potential benefits and risks, the patient expressed a preference to monitor the situation for now.  He acknowledged the potential for serious health consequences of delaying this intervention.  In line with his preferences, we will continue to monitor the situation and discuss again as at the next visit or sooner if his condition changes.  I encouraged him to keep me updated on any developments. Offered call in [place] if he just lets me know by MyChart.

## 2022-08-19 NOTE — Assessment & Plan Note (Deleted)
Interim history from August 19, 2022:    Home readings: occasional usually  good. Patient reports taking current medications consistently and not experiencing any significant associated side effects or symptoms. Current hypertension medications:       Sig   amLODipine (NORVASC) 5 MG tablet (Taking) Take 1 tablet (5 mg total) by mouth daily.   losartan (COZAAR) 50 MG tablet (Taking) Take 50 mg by mouth daily.   Metoprolol Tartrate 37.5 MG TABS (Taking) Take 2 tablets by mouth daily.   metoprolol succinate (TOPROL XL) 25 MG 24 hr tablet Take 1 tablet (25 mg total) by mouth daily.    Patient not taking: Reported on 08/19/2022   Metoprolol Tartrate 37.5 MG TABS (Discontinued) TAKE 1 TABLET BY MOUTH TWICE A DAY      Lab Results  Component Value Date   NA 141 06/15/2021   K 4.1 06/15/2021   CREATININE 1.17 06/15/2021

## 2022-08-19 NOTE — Assessment & Plan Note (Signed)
Erectile Dysfunction: Currently managed with as-needed Tadalafil. We discussed the potential benefits of daily dosing versus as-needed and the potential link to low testosterone. We will continue Tadalafil as needed and check testosterone levels with upcoming blood work.

## 2022-08-20 ENCOUNTER — Encounter: Payer: Self-pay | Admitting: Dermatology

## 2022-08-20 ENCOUNTER — Encounter: Payer: Self-pay | Admitting: Internal Medicine

## 2022-08-20 ENCOUNTER — Ambulatory Visit (INDEPENDENT_AMBULATORY_CARE_PROVIDER_SITE_OTHER): Payer: Medicare Other | Admitting: Dermatology

## 2022-08-20 VITALS — BP 131/81 | HR 57

## 2022-08-20 DIAGNOSIS — L820 Inflamed seborrheic keratosis: Secondary | ICD-10-CM

## 2022-08-20 DIAGNOSIS — Z1283 Encounter for screening for malignant neoplasm of skin: Secondary | ICD-10-CM

## 2022-08-20 DIAGNOSIS — D1801 Hemangioma of skin and subcutaneous tissue: Secondary | ICD-10-CM

## 2022-08-20 DIAGNOSIS — W908XXA Exposure to other nonionizing radiation, initial encounter: Secondary | ICD-10-CM

## 2022-08-20 DIAGNOSIS — L578 Other skin changes due to chronic exposure to nonionizing radiation: Secondary | ICD-10-CM

## 2022-08-20 DIAGNOSIS — L821 Other seborrheic keratosis: Secondary | ICD-10-CM

## 2022-08-20 DIAGNOSIS — L219 Seborrheic dermatitis, unspecified: Secondary | ICD-10-CM | POA: Diagnosis not present

## 2022-08-20 DIAGNOSIS — L814 Other melanin hyperpigmentation: Secondary | ICD-10-CM

## 2022-08-20 DIAGNOSIS — D229 Melanocytic nevi, unspecified: Secondary | ICD-10-CM

## 2022-08-20 DIAGNOSIS — L570 Actinic keratosis: Secondary | ICD-10-CM

## 2022-08-20 NOTE — Patient Instructions (Addendum)
Hi Nathaniel Bender,   Thank you for visiting my office today. I appreciate your commitment to monitoring your skin health, especially given your family history. Here is a summary of our session and the recommendations I provided:  - Skin Examination: We conducted a thorough check of your skin, focusing on areas with actinic damage and seborrheic keratoses. No malignant lesions were observed.  - Treatment Administered:   - Freezing of approximately 10 actinic keratosis spots to prevent progression to skin cancer.   - Recommended use of DHS Zinc shampoo for scalp irritation, available over-the-counter.  - Medication and Care:   - Advised on the use of Vaseline to cover treated areas to enhance healing and reduce irritation. Avoid Neosporin if you have allergies to it.   - Discussed potential use of topical fluorouracil cream for actinic keratosis in a more suitable season (winter).  - Follow-Up:   - Please return in six months for a follow-up examination to monitor the treated areas and assess any new concerns.   - If the scalp condition does not improve with the recommended shampoo, please schedule an appointment to explore further treatment options.  - Preventive Care:   - Continue to protect your skin using sunscreen, wearing hats, and seeking shade to minimize further sun damage.  Thank you once again for your proactive approach to your dermatological health. Should you have any questions or need further assistance before your next visit, do not hesitate to contact our office.       Cryotherapy Aftercare  Wash gently with soap and water everyday.   Apply Vaseline and Band-Aid daily until healed.  Due to recent changes in healthcare laws, you may see results of your pathology and/or laboratory studies on MyChart before the doctors have had a chance to review them. We understand that in some cases there may be results that are confusing or concerning to you. Please understand that not all  results are received at the same time and often the doctors may need to interpret multiple results in order to provide you with the best plan of care or course of treatment. Therefore, we ask that you please give [place] 2 business days to thoroughly review all your results before contacting the office for clarification. Should we see a critical lab result, you will be contacted sooner.   If You Need Anything After Your Visit  If you have any questions or concerns for your doctor, please call our main line at [phone] If no one answers, please leave a voicemail as directed and we will return your call as soon as possible. Messages left after 4 pm will be answered the following business day.   You may also send [place] a message via MyChart. We typically respond to MyChart messages within 1-2 business days.  For prescription refills, please ask your pharmacy to contact our office. Our fax number is [phone].  If you have an urgent issue when the clinic is closed that cannot wait until the next business day, you can page your doctor at the number below.    Please note that while we do our best to be available for urgent issues outside of office hours, we are not available 24/7.   If you have an urgent issue and are unable to reach [place], you may choose to seek medical care at your doctor's office, retail clinic, urgent care center, or emergency room.  If you have a medical emergency, please immediately call 911 or go to the emergency department.  In the event of inclement weather, please call our main line at [phone] for an update on the status of any delays or closures.  Dermatology Medication Tips: Please keep the boxes that topical medications come in in order to help keep track of the instructions about where and how to use these. Pharmacies typically print the medication instructions only on the boxes and not directly on the medication tubes.   If your medication is too expensive, please  contact our office at [phone] or send [place] a message through MyChart.   We are unable to tell what your co-pay for medications will be in advance as this is different depending on your insurance coverage. However, we may be able to find a substitute medication at lower cost or fill out paperwork to get insurance to cover a needed medication.   If a prior authorization is required to get your medication covered by your insurance company, please allow [place] 1-2 business days to complete this process.  Drug prices often vary depending on where the prescription is filled and some pharmacies may offer cheaper prices.  The website www.goodrx.com contains coupons for medications through different pharmacies. The prices here do not account for what the cost may be with help from insurance (it may be cheaper with your insurance), but the website can give you the price if you did not use any insurance.  - You can print the associated coupon and take it with your prescription to the pharmacy.  - You may also stop by our office during regular business hours and pick up a GoodRx coupon card.  - If you need your prescription sent electronically to a different pharmacy, notify our office through [facility] or by phone at [phone]

## 2022-08-20 NOTE — Progress Notes (Signed)
   New Patient Visit   Subjective  Nathaniel Bender. is a 72 y.o. male who presents for the following: Skin Cancer Screening and Upper Body Skin Exam  The patient presents for Upper Body Skin Exam (UBSE) for skin cancer screening and mole check. The patient has spots, moles and lesions to be evaluated, some may be new or changing and the patient may have concern these could be cancer.    The following portions of the chart were reviewed this encounter and updated as appropriate: medications, allergies, medical history  Review of Systems:  No other skin or systemic complaints except as noted in HPI or Assessment and Plan.  Objective  Well appearing patient in no apparent distress; mood and affect are within normal limits.  All skin waist up examined. Relevant physical exam findings are noted in the Assessment and Plan.  Exam: Pink patches with greasy scale at scalp Scattered waxy plaques on torso  Assessment & Plan    Skin cancer screening performed today.  Actinic Damage - Chronic condition, secondary to cumulative UV/sun exposure - diffuse scaly erythematous macules with underlying dyspigmentation - Recommend daily broad spectrum sunscreen SPF 30+ to sun-exposed areas, reapply every 2 hours as needed.  - Staying in the shade or wearing long sleeves, sun glasses (UVA+UVB protection) and wide brim hats (4-inch brim around the entire circumference of the hat) are also recommended for sun protection.  - Call for new or changing lesions.  Lentigines, Seborrheic Keratoses, Hemangiomas - Benign normal skin lesions - Benign-appearing - Call for any changes  Melanocytic Nevi - Tan-brown and/or pink-flesh-colored symmetric macules and papules - Benign appearing on exam today - Observation - Call clinic for new or changing moles - Recommend daily use of broad spectrum spf 30+ sunscreen to sun-exposed areas.   - Continue to protect your skin using sunscreen, wearing hats, and seeking  shade to minimize further sun damage.  - Discussed potential use of topical fluorouracil cream for actinic keratosis in a more suitable season (winter).   SEBORRHEIC DERMATITIS   flared  Seborrheic Dermatitis is a chronic persistent rash characterized by pinkness and scaling most commonly of the mid face but also can occur on the scalp (dandruff), ears; mid chest, mid back and groin.  It tends to be exacerbated by stress and cooler weather.  People who have neurologic disease may experience new onset or exacerbation of existing seborrheic dermatitis.  The condition is not curable but treatable and can be controlled.  Treatment Plan:  - Recommended use of DHS Zinc shampoo for scalp irritation, available over-the-counter.  No follow-ups on file.  Nathaniel Shark, CMA, am acting as scribe for [employer], DO.   Documentation: I have reviewed the above documentation for accuracy and completeness, and I agree with the above.  Nathaniel Reusing, DO

## 2022-08-27 ENCOUNTER — Encounter (HOSPITAL_BASED_OUTPATIENT_CLINIC_OR_DEPARTMENT_OTHER): Payer: Self-pay

## 2022-08-27 DIAGNOSIS — I10 Essential (primary) hypertension: Secondary | ICD-10-CM

## 2022-08-29 MED ORDER — AMLODIPINE BESYLATE 10 MG PO TABS: 10.0000 mg | ORAL_TABLET | Freq: Every day | ORAL | Status: DC

## 2022-09-02 ENCOUNTER — Encounter (HOSPITAL_BASED_OUTPATIENT_CLINIC_OR_DEPARTMENT_OTHER): Payer: Self-pay

## 2022-09-03 NOTE — Telephone Encounter (Signed)
BP update 

## 2022-09-17 ENCOUNTER — Ambulatory Visit (INDEPENDENT_AMBULATORY_CARE_PROVIDER_SITE_OTHER): Payer: Medicare Other

## 2022-09-17 DIAGNOSIS — I6523 Occlusion and stenosis of bilateral carotid arteries: Secondary | ICD-10-CM

## 2022-09-19 ENCOUNTER — Other Ambulatory Visit (HOSPITAL_BASED_OUTPATIENT_CLINIC_OR_DEPARTMENT_OTHER): Payer: Self-pay

## 2022-09-19 DIAGNOSIS — I6523 Occlusion and stenosis of bilateral carotid arteries: Secondary | ICD-10-CM

## 2022-09-19 NOTE — Addendum Note (Signed)
Addended by: Marlene Lard on: 09/19/2022 12:55 PM   Modules accepted: Orders

## 2022-10-15 ENCOUNTER — Ambulatory Visit (INDEPENDENT_AMBULATORY_CARE_PROVIDER_SITE_OTHER): Payer: Medicare Other

## 2022-10-15 VITALS — Wt 170.0 lb

## 2022-10-15 DIAGNOSIS — Z0000 Encounter for general adult medical examination without abnormal findings: Secondary | ICD-10-CM | POA: Diagnosis not present

## 2022-10-15 NOTE — Patient Instructions (Signed)
Nathaniel Bender , Thank you for taking time to come for your Medicare Wellness Visit. I appreciate your ongoing commitment to your health goals. Please review the following plan we discussed and let me know if I can assist you in the future.   Referrals/Orders/Follow-Ups/Clinician Recommendations: continue to stay healthy and active  This is a list of the screening recommended for you and due dates:  Health Maintenance  Topic Date Due   Zoster (Shingles) Vaccine (1 of 2) 02/07/1969   DTaP/Tdap/Td vaccine (2 - Tdap) 01/21/2018   Pneumonia Vaccine (2 of 2 - PPSV23 or PCV20) 06/03/2018   COVID-19 Vaccine (5 - 2023-24 season) 09/22/2022   Flu Shot  04/21/2023*   Medicare Annual Wellness Visit  10/15/2023   Colon Cancer Screening  04/17/2024   Hepatitis C Screening  Completed   HPV Vaccine  Aged Out  *Topic was postponed. The date shown is not the original due date.    Advanced directives: (Copy Requested) Please bring a copy of your health care power of attorney and living will to the office to be added to your chart at your convenience.  Next Medicare Annual Wellness Visit scheduled for next year: Yes

## 2022-10-15 NOTE — Progress Notes (Signed)
Subjective:   Nathaniel Bender. is a 72 y.o. male who presents for Medicare Annual/Subsequent preventive examination.  Visit Complete: Virtual  I connected with  Nathaniel Bender. on 10/15/22 by a audio enabled telemedicine application and verified that I am speaking with the correct person using two identifiers.  Patient Location: Home  Provider Location: Home Office  I discussed the limitations of evaluation and management by telemedicine. The patient expressed understanding and agreed to proceed.    Cardiac Risk Factors include: advanced age (>74men, >29 women)     Objective:    Today's Vitals   10/15/22 1150  Weight: 170 lb (77.1 kg)   Body mass index is 26.63 kg/m.     10/15/2022   11:53 AM 07/02/2021    1:10 PM 05/06/2021    8:00 PM 05/06/2021    1:07 PM 08/06/2019   10:43 AM 01/01/2018    9:35 AM  Advanced Directives  Does Patient Have a Medical Advance Directive? Yes Yes Yes Yes Yes Yes  Type of Estate agent of [place];Living will Healthcare Power of [place];Living will Healthcare Power of [place];Living will   Healthcare Power of [place];Living will  Does patient want to make changes to medical advance directive?  No - Patient declined No - Patient declined No - Patient declined Yes (Inpatient - patient defers changing a medical advance directive and declines information at this time) Yes (ED - Information included in AVS)  Copy of Healthcare Power of Attorney in Chart? No - copy requested No - copy requested No - copy requested   No - copy requested    Current Medications (verified) Outpatient Encounter Medications as of 10/15/2022  Medication Sig   amLODipine (NORVASC) 10 MG tablet Take 1 tablet (10 mg total) by mouth daily.   aspirin (ASPIRIN 81) 81 MG chewable tablet Chew 81 mg by mouth daily.   atorvastatin (LIPITOR) 80 MG tablet TAKE 1 TABLET BY MOUTH EVERY DAY   clindamycin (CLEOCIN T) 1 % external solution As needed.   ezetimibe  (ZETIA) 10 MG tablet Take 1 tablet (10 mg total) by mouth daily.   famotidine-calcium carbonate-magnesium hydroxide (PEPCID COMPLETE) 10-800-165 MG chewable tablet Chew 1 tablet by mouth 2 (two) times daily as needed.   Ibuprofen-diphenhydrAMINE Cit (MOTRIN PM PO) Take by mouth.   losartan (COZAAR) 50 MG tablet Take 50 mg by mouth daily.   naproxen sodium (ALEVE) 220 MG tablet Take 220 mg by mouth daily as needed (pain.).   omeprazole (PRILOSEC) 10 MG capsule Take 1 capsule (10 mg total) by mouth daily.   tadalafil (CIALIS) 20 MG tablet Take 0.5 tablets (10 mg total) by mouth daily as needed for erectile dysfunction.   Turmeric (QC TUMERIC COMPLEX PO) Take by mouth daily.   [DISCONTINUED] metoprolol succinate (TOPROL XL) 25 MG 24 hr tablet Take 1 tablet (25 mg total) by mouth daily. (Patient not taking: Reported on 08/19/2022)   [DISCONTINUED] Metoprolol Tartrate 37.5 MG TABS Take 2 tablets by mouth daily.   No facility-administered encounter medications on file as of 10/15/2022.    Allergies (verified) Patient has no known allergies.   History: Past Medical History:  Diagnosis Date   Acute respiratory failure with hypoxia (HCC)    Arthritis    Degenerative Disc Disease Cervical; s/p injection [facility].   Atypical mole 05/04/2013   Right Wrist-Severe   Chest pain 05/06/2021   Colon polyps    Gout    Hyperlipidemia    Hypertension  Meniere's disease of left ear    s/p ENT evaluation; acute hearing loss; chronic tinnitus L now.  Recovered hearing completely.   Sudden idiopathic hearing loss 11/20/2011   Seen by [facility] 10/2011     Past Surgical History:  Procedure Laterality Date   CLIPPING OF ATRIAL APPENDAGE Left 05/08/2021   Procedure: CLIPPING OF ATRIAL APPENDAGE;  Surgeon: Corliss Skains, MD;  Location: [facility] OR;  Service: Open Heart Surgery;  Laterality: Left;   CORONARY ARTERY BYPASS GRAFT N/A 05/08/2021   Procedure: CORONARY ARTERY BYPASS GRAFTING x4  USING LEFT INTERNAL MAMMORY ARTERY AND BILATERAL GREATER SAPHENOUS VEIN;  Surgeon: Corliss Skains, MD;  Location: [facility] OR;  Service: Open Heart Surgery;  Laterality: N/A;  atriclip placement.  flow trac   ENDOVEIN HARVEST OF GREATER SAPHENOUS VEIN Bilateral 05/08/2021   Procedure: ENDOVEIN HARVEST OF GREATER SAPHENOUS VEIN;  Surgeon: Corliss Skains, MD;  Location: [facility] OR;  Service: Open Heart Surgery;  Laterality: Bilateral;   LEFT HEART CATH AND CORONARY ANGIOGRAPHY N/A 05/07/2021   Procedure: LEFT HEART CATH AND CORONARY ANGIOGRAPHY;  Surgeon: Yvonne Kendall, MD;  Location: [facility] INVASIVE CV LAB;  Service: Cardiovascular;  Laterality: N/A;   ROTATOR CUFF REPAIR Right    TEE WITHOUT CARDIOVERSION  05/08/2021   Procedure: TRANSESOPHAGEAL ECHOCARDIOGRAM (TEE);  Surgeon: Corliss Skains, MD;  Location: [facility] OR;  Service: Open Heart Surgery;;   TONSILLECTOMY     Family History  Problem Relation Age of Onset   Hyperlipidemia Mother    Hypertension Mother    Arthritis Mother 31       DDD lumbar spine   Diabetes Father    Heart disease Father 27       CAD/CABG   Hyperlipidemia Father    Hypertension Father    Arthritis Sister    Hyperlipidemia Sister    Hypertension Sister    Arthritis Maternal Grandmother    Heart disease Maternal Grandfather    Arthritis Paternal Grandmother    Mental illness Paternal Grandmother    Heart disease Paternal Grandfather    Stroke Paternal Grandfather    Social History   Socioeconomic History   Marital status: Married    Spouse name: Not on file   Number of children: Not on file   Years of education: Not on file   Highest education level: Not on file  Occupational History   Occupation: Sales  Tobacco Use   Smoking status: Never   Smokeless tobacco: Never  Vaping Use   Vaping status: Never Used  Substance and Sexual Activity   Alcohol use: Yes    Alcohol/week: 12.0 standard drinks of alcohol    Types: 10 Glasses of wine, 2 Shots of  liquor per week   Drug use: Not Currently    Types: Cocaine, Marijuana, LSD   Sexual activity: Yes    Birth control/protection: None  Other Topics Concern   Not on file  Social History Narrative   Marital:  Married. X 26 years.        Children: none      Lives: with wife       Education: College/Other. Exercise: Yes.      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      Tobacco: never      Alcohol:  2-3 per day; more on weekends; wine.      Exercise:  Walking 2 days per week;  2-4 miles.      Seatbelt:  100%      Guns: 12 gauge shot gun.      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     Social Determinants of Health   Financial Resource Strain: Low Risk  (10/15/2022)   Overall Financial Resource Strain (CARDIA)    Difficulty of Paying Living Expenses: Not hard at all  Food Insecurity: No Food Insecurity (10/15/2022)   Hunger Vital Sign    Worried About Running Out of Food in the Last Year: Never true    Ran Out of Food in the Last Year: Never true  Transportation Needs: No Transportation Needs (10/15/2022)   PRAPARE - Administrator, Civil Service (Medical): No    Lack of Transportation (Non-Medical): No  Physical Activity: Sufficiently Active (10/15/2022)   Exercise Vital Sign    Days of Exercise per Week: 7 days    Minutes of Exercise per Session: 60 min  Stress: No Stress Concern Present (10/15/2022)   [employer] of Occupational Health - Occupational Stress Questionnaire    Feeling of Stress : Not at all  Social Connections: Moderately Integrated (10/15/2022)   Social Connection and Isolation Panel [NHANES]    Frequency of Communication with Friends and Family: More than three times a week    Frequency of Social Gatherings with Friends and Family: More than three times a week    Attends Religious Services: Never    Database administrator or  Organizations: Yes    Attends Engineer, structural: 1 to 4 times per year    Marital Status: Married    Tobacco Counseling Counseling given: Not Answered   Clinical Intake:  Pre-visit preparation completed: Yes  Pain : No/denies pain     BMI - recorded: 26.63 Nutritional Status: BMI 25 -29 Overweight Nutritional Risks: None Diabetes: No  How often do you need to have someone help you when you read instructions, pamphlets, or other written materials from your doctor or pharmacy?: 1 - Never  Interpreter Needed?: No  Information entered by :: Samantha Crimes, LPN   Activities of Daily Living    10/15/2022   11:51 AM  In your present state of health, do you have any difficulty performing the following activities:  Hearing? 1  Comment slight HOH  Vision? 0  Difficulty concentrating or making decisions? 0  Walking or climbing stairs? 0  Dressing or bathing? 0  Doing errands, shopping? 0  Preparing Food and eating ? N  Using the Toilet? N  In the past six months, have you accidently leaked urine? N  Do you have problems with loss of bowel control? N  Managing your Medications? N  Managing your Finances? N  Housekeeping or managing your Housekeeping? N    Patient Care Team: Lula Olszewski, MD as PCP - General (Internal Medicine) Jodelle Red, MD as PCP - Cardiology (Cardiology) Glyn Ade, PA-C as Physician Assistant (Dermatology) Terri Piedra, DO as Consulting Physician (Dermatology)  Indicate any recent Medical Services you may have received from other than [facility] providers in the past year (date may be approximate).     Assessment:   This is a routine wellness examination for [place].  Hearing/Vision screen Hearing Screening - Comments:: Pt  stated slight hearing loss  Vision Screening - Comments:: Pt follows up with dr Blima Ledger for annual eye exams    Goals Addressed  This Visit's Progress    Patient  Stated       Continue to stay active and healthy        Depression Screen    10/15/2022   11:53 AM 08/19/2022    1:30 PM 07/02/2021   12:39 PM 08/06/2019   10:39 AM 02/05/2019    1:43 PM 07/03/2018    9:56 AM 01/01/2018    9:32 AM  PHQ 2/9 Scores  PHQ - 2 Score 0 0 0 0 0 0 0  PHQ- 9 Score   4        Fall Risk    10/15/2022   11:55 AM 08/19/2022    1:30 PM 07/02/2021   12:37 PM 08/06/2019   10:38 AM 02/05/2019    1:42 PM  Fall Risk   Falls in the past year? 0 0 0 0 0  Number falls in past yr: 0 0 0 0 0  Injury with Fall? 0 0 0 0 0  Risk for fall due to : Impaired vision No Fall Risks No Fall Risks No Fall Risks   Follow up Falls prevention discussed Falls evaluation completed Falls evaluation completed Falls evaluation completed     MEDICARE RISK AT HOME: Medicare Risk at Home Any stairs in or around the home?: No If so, are there any without handrails?: No Home free of loose throw rugs in walkways, pet beds, electrical cords, etc?: Yes Adequate lighting in your home to reduce risk of falls?: Yes Life alert?: No Use of a cane, walker or w/c?: No Grab bars in the bathroom?: No Shower chair or bench in shower?: No Elevated toilet seat or a handicapped toilet?: No  TIMED UP AND GO:  Was the test performed?  No    Cognitive Function:        10/15/2022   11:57 AM 08/06/2019   10:39 AM 01/01/2018    9:33 AM  6CIT Screen  What Year? 0 points 0 points 0 points  What month? 0 points 0 points 0 points  What time? 0 points 0 points 0 points  Count back from 20 0 points 0 points 0 points  Months in reverse 0 points 0 points 0 points  Repeat phrase 0 points 0 points 0 points  Total Score 0 points 0 points 0 points    Immunizations Immunization History  Administered Date(s) Administered   Influenza Split 11/10/2011, 10/21/2012, 01/20/2016   Influenza, High Dose Seasonal PF 01/01/2018, 11/30/2018   Influenza,inj,Quad PF,6+ Mos 12/29/2013, 01/13/2015, 12/16/2016    Influenza-Unspecified 02/07/2020   PFIZER(Purple Top)SARS-COV-2 Vaccination 02/10/2019, 03/01/2019, 09/30/2019, 06/10/2020   Pneumococcal Conjugate-13 06/02/2017   Td 01/22/2008   Zoster, Live 01/21/2010    TDAP status: Due, Education has been provided regarding the importance of this vaccine. Advised may receive this vaccine at local pharmacy or Health Dept. Aware to provide a copy of the vaccination record if obtained from local pharmacy or Health Dept. Verbalized acceptance and understanding.  Flu Vaccine status: Due, Education has been provided regarding the importance of this vaccine. Advised may receive this vaccine at local pharmacy or Health Dept. Aware to provide a copy of the vaccination record if obtained from local pharmacy or Health Dept. Verbalized acceptance and understanding.  Pneumococcal vaccine status: Due, Education has been provided regarding the importance of this vaccine. Advised may receive this vaccine at local pharmacy or Health Dept. Aware to provide a copy of the vaccination record if obtained from local pharmacy or Health Dept. Verbalized acceptance and  understanding.  Covid-19 vaccine status: Information provided on how to obtain vaccines.   Qualifies for Shingles Vaccine? Yes   Zostavax completed No   Shingrix Completed?: No.    Education has been provided regarding the importance of this vaccine. Patient has been advised to call insurance company to determine out of pocket expense if they have not yet received this vaccine. Advised may also receive vaccine at local pharmacy or Health Dept. Verbalized acceptance and understanding.  Screening Tests Health Maintenance  Topic Date Due   Zoster Vaccines- Shingrix (1 of 2) 02/07/1971   DTaP/Tdap/Td (2 - Tdap) 01/21/2018   Pneumonia Vaccine 34+ Years old (2 of 2 - PPSV23 or PCV20) 06/03/2018   COVID-19 Vaccine (5 - 2023-24 season) 09/22/2022   INFLUENZA VACCINE  04/21/2023 (Originally 08/22/2022)   Medicare Annual  Wellness (AWV)  10/15/2023   Colonoscopy  04/17/2024   Hepatitis C Screening  Completed   HPV VACCINES  Aged Out    Health Maintenance  Health Maintenance Due  Topic Date Due   Zoster Vaccines- Shingrix (1 of 2) 02/07/1971   DTaP/Tdap/Td (2 - Tdap) 01/21/2018   Pneumonia Vaccine 36+ Years old (2 of 2 - PPSV23 or PCV20) 06/03/2018   COVID-19 Vaccine (5 - 2023-24 season) 09/22/2022    Colorectal cancer screening: Type of screening: Colonoscopy. Completed 04/18/14. Repeat every 10 years   Additional Screening:  Hepatitis C Screening:  Completed 11/10/11  Vision Screening: Recommended annual ophthalmology exams for early detection of glaucoma and other disorders of the eye. Is the patient up to date with their annual eye exam?  Yes  Who is the provider or what is the name of the office in which the patient attends annual eye exams? [facility]  If pt is not established with a provider, would they like to be referred to a provider to establish care? No .   Dental Screening: Recommended annual dental exams for proper oral hygiene   Community Resource Referral / Chronic Care Management: CRR required this visit?  No   CCM required this visit?  No     Plan:     I have personally reviewed and noted the following in the patient's chart:   Medical and social history Use of alcohol, tobacco or illicit drugs  Current medications and supplements including opioid prescriptions. Patient is not currently taking opioid prescriptions. Functional ability and status Nutritional status Physical activity Advanced directives List of other physicians Hospitalizations, surgeries, and ER visits in previous 12 months Vitals Screenings to include cognitive, depression, and falls Referrals and appointments  In addition, I have reviewed and discussed with patient certain preventive protocols, quality metrics, and best practice recommendations. A written personalized care plan for preventive  services as well as general preventive health recommendations were provided to patient.     Marzella Schlein, LPN   [id number]/[id number]   After Visit Summary: (MyChart) Due to this being a telephonic visit, the after visit summary with patients personalized plan was offered to patient via MyChart   Nurse Notes: none

## 2022-10-31 ENCOUNTER — Telehealth: Payer: Self-pay | Admitting: Internal Medicine

## 2022-10-31 NOTE — Telephone Encounter (Signed)
I have left patient vm to call me back in regard to discussion for TOC started with Lanier Ensign.

## 2022-11-13 NOTE — Telephone Encounter (Signed)
Patient returned your call. Requests callback when able.

## 2022-11-28 NOTE — Telephone Encounter (Signed)
Left patient vm to call me back in regard to TOC.

## 2022-12-04 NOTE — Telephone Encounter (Addendum)
Patient called back. States he will be out of town until 11/18 and requests to be reached back after that by Tammy.

## 2022-12-13 ENCOUNTER — Other Ambulatory Visit (HOSPITAL_BASED_OUTPATIENT_CLINIC_OR_DEPARTMENT_OTHER): Payer: Self-pay | Admitting: Family

## 2022-12-16 NOTE — Telephone Encounter (Signed)
Patient is scheduled with Dr. Ruthine Dose for transfer.

## 2022-12-25 ENCOUNTER — Encounter (HOSPITAL_BASED_OUTPATIENT_CLINIC_OR_DEPARTMENT_OTHER): Payer: Self-pay

## 2022-12-25 DIAGNOSIS — I25118 Atherosclerotic heart disease of native coronary artery with other forms of angina pectoris: Secondary | ICD-10-CM

## 2022-12-26 ENCOUNTER — Other Ambulatory Visit: Payer: Medicare Other

## 2022-12-26 DIAGNOSIS — Z125 Encounter for screening for malignant neoplasm of prostate: Secondary | ICD-10-CM

## 2022-12-26 DIAGNOSIS — I252 Old myocardial infarction: Secondary | ICD-10-CM

## 2022-12-26 DIAGNOSIS — E291 Testicular hypofunction: Secondary | ICD-10-CM

## 2022-12-26 LAB — MICROALBUMIN / CREATININE URINE RATIO
Creatinine,U: 90.5 mg/dL
Microalb Creat Ratio: 1.7 mg/g (ref 0.0–30.0)
Microalb, Ur: 1.6 mg/dL (ref 0.0–1.9)

## 2022-12-26 LAB — LIPID PANEL
Cholesterol: 122 mg/dL (ref 0–200)
HDL: 42.7 mg/dL (ref >39.00)
LDL Cholesterol: 58 mg/dL (ref 0–99)
NonHDL: 79.3
Total CHOL/HDL Ratio: 3
Triglycerides: 107 mg/dL (ref 0.0–149.0)
VLDL: 21.4 mg/dL (ref 0.0–40.0)

## 2022-12-26 LAB — CBC WITH DIFFERENTIAL/PLATELET
Basophils Absolute: 0.1 10*3/uL (ref 0.0–0.1)
Basophils Relative: 1.2 % (ref 0.0–3.0)
Eosinophils Absolute: 0.3 10*3/uL (ref 0.0–0.7)
Eosinophils Relative: 5.9 % — ABNORMAL HIGH (ref 0.0–5.0)
HCT: 43.8 % (ref 39.0–52.0)
Hemoglobin: 15.2 g/dL (ref 13.0–17.0)
Lymphocytes Relative: 38.6 % (ref 12.0–46.0)
Lymphs Abs: 2.1 10*3/uL (ref 0.7–4.0)
MCHC: 34.6 g/dL (ref 30.0–36.0)
MCV: 88.8 fL (ref 78.0–100.0)
Monocytes Absolute: 0.6 10*3/uL (ref 0.1–1.0)
Monocytes Relative: 10.4 % (ref 3.0–12.0)
Neutro Abs: 2.3 10*3/uL (ref 1.4–7.7)
Neutrophils Relative %: 43.9 % (ref 43.0–77.0)
Platelets: 145 10*3/uL — ABNORMAL LOW (ref 150.0–400.0)
RBC: 4.93 Mil/uL (ref 4.22–5.81)
RDW: 13.9 % (ref 11.5–15.5)
WBC: 5.3 10*3/uL (ref 4.0–10.5)

## 2022-12-26 LAB — COMPREHENSIVE METABOLIC PANEL
ALT: 22 U/L (ref 0–53)
AST: 21 U/L (ref 0–37)
Albumin: 4.5 g/dL (ref 3.5–5.2)
Alkaline Phosphatase: 69 U/L (ref 39–117)
BUN: 18 mg/dL (ref 6–23)
CO2: 27 meq/L (ref 19–32)
Calcium: 9.5 mg/dL (ref 8.4–10.5)
Chloride: 105 meq/L (ref 96–112)
Creatinine, Ser: 1.04 mg/dL (ref 0.40–1.50)
GFR: 72.56 mL/min (ref >60.00)
Glucose, Bld: 105 mg/dL — ABNORMAL HIGH (ref 70–99)
Potassium: 4 meq/L (ref 3.5–5.1)
Sodium: 141 meq/L (ref 135–145)
Total Bilirubin: 1 mg/dL (ref 0.2–1.2)
Total Protein: 7 g/dL (ref 6.0–8.3)

## 2022-12-26 LAB — HEMOGLOBIN A1C: Hgb A1c MFr Bld: 6.3 % (ref 4.6–6.5)

## 2022-12-26 LAB — TESTOSTERONE: Testosterone: 276.52 ng/dL — ABNORMAL LOW (ref 300.00–890.00)

## 2022-12-26 MED ORDER — METOPROLOL SUCCINATE ER 25 MG PO TB24: 25.0000 mg | ORAL_TABLET | Freq: Every day | ORAL | 1 refills | Status: DC

## 2022-12-27 MED ORDER — ATORVASTATIN CALCIUM 40 MG PO TABS: 40.0000 mg | ORAL_TABLET | Freq: Every day | ORAL | 3 refills | Status: DC

## 2022-12-27 NOTE — Addendum Note (Signed)
Addended by: Alver Sorrow on: 12/27/2022 09:10 AM   Modules accepted: Orders

## 2022-12-29 ENCOUNTER — Encounter: Payer: Self-pay | Admitting: Internal Medicine

## 2022-12-29 DIAGNOSIS — D696 Thrombocytopenia, unspecified: Secondary | ICD-10-CM | POA: Insufficient documentation

## 2022-12-30 NOTE — Telephone Encounter (Signed)
Patient's review of lab results/notes confirmed.

## 2023-01-02 ENCOUNTER — Encounter: Payer: Medicare Other | Admitting: Internal Medicine

## 2023-01-03 ENCOUNTER — Ambulatory Visit (INDEPENDENT_AMBULATORY_CARE_PROVIDER_SITE_OTHER): Payer: Medicare Other | Admitting: Family Medicine

## 2023-01-03 ENCOUNTER — Encounter: Payer: Self-pay | Admitting: Family Medicine

## 2023-01-03 VITALS — BP 136/85 | HR 48 | Temp 97.9°F | Resp 16 | Ht 64.0 in | Wt 168.4 lb

## 2023-01-03 DIAGNOSIS — I10 Essential (primary) hypertension: Secondary | ICD-10-CM | POA: Diagnosis not present

## 2023-01-03 DIAGNOSIS — R7303 Prediabetes: Secondary | ICD-10-CM

## 2023-01-03 DIAGNOSIS — E7800 Pure hypercholesterolemia, unspecified: Secondary | ICD-10-CM | POA: Diagnosis not present

## 2023-01-03 DIAGNOSIS — I2510 Atherosclerotic heart disease of native coronary artery without angina pectoris: Secondary | ICD-10-CM | POA: Diagnosis not present

## 2023-01-03 DIAGNOSIS — Z125 Encounter for screening for malignant neoplasm of prostate: Secondary | ICD-10-CM

## 2023-01-03 NOTE — Assessment & Plan Note (Signed)
Chronic.  Controlled.  Continue losartan 50mg  and metoprolol xl 25mg 

## 2023-01-03 NOTE — Progress Notes (Signed)
Subjective:     Patient ID: Nathaniel Bender., male    DOB: [birth year], 72 y.o.   MRN: [id number]  Chief Complaint  Patient presents with   Transfer of Care    [facility] Issues with bottom of feet, seeing podiatry for    HPI TOC  CAD-NSTEMI. S/p CAGB-4v in past. Taking ASA 81mg , atorvastatin 40mg , zetia 10mg , metoprolol LS 25mg  and losartan 50mg  Seeing Dr. Cristal Deer.  Very active.  No cp.   Some DOE w/hills.  HTN-well controlled. Taking losartan 50mg  and metoprolol 25mg .  No ha/dizziness/cp/edema PreDM-working on diet/exercise 2 yrs ago-hiking and injured B feet.   Seeing foot docs, PT, etc  has  had MRI, etc.  Painful to walk.  Poss Morton's-has video visit PSA not checking in years.  No symptoms   n FH  There are no preventive care reminders to display for this patient.   Past Medical History:  Diagnosis Date   Acute respiratory failure with hypoxia (HCC)    Arthritis    Degenerative Disc Disease Cervical; s/p injection [facility].   Atypical mole 05/04/2013   Right Wrist-Severe   CAD (coronary artery disease)    Chest pain 05/06/2021   Colon polyps    Hyperlipidemia    Hypertension    Kidney stone    Meniere's disease of left ear    s/p ENT evaluation; acute hearing loss; chronic tinnitus L now.  Recovered hearing completely.   Sudden idiopathic hearing loss 11/20/2011   Seen by [facility]NT 10/2011      Past Surgical History:  Procedure Laterality Date   CLIPPING OF ATRIAL APPENDAGE Left 05/08/2021   Procedure: CLIPPING OF ATRIAL APPENDAGE;  Surgeon: Corliss Skains, MD;  [facility]: [facility] OR;  Service: Open Heart Surgery;  Laterality: Left;   CORONARY ARTERY BYPASS GRAFT N/A 05/08/2021   Procedure: CORONARY ARTERY BYPASS GRAFTING x4 USING LEFT INTERNAL MAMMORY ARTERY AND BILATERAL GREATER SAPHENOUS VEIN;  Surgeon: Corliss Skains, MD;  Location: [facility] OR;  Service: Open Heart Surgery;  Laterality: N/A;  atriclip placement.  flow trac   ENDOVEIN HARVEST OF  GREATER SAPHENOUS VEIN Bilateral 05/08/2021   Procedure: ENDOVEIN HARVEST OF GREATER SAPHENOUS VEIN;  Surgeon: Corliss Skains, MD;  Location: [facility] OR;  Service: Open Heart Surgery;  Laterality: Bilateral;   LEFT HEART CATH AND CORONARY ANGIOGRAPHY N/A 05/07/2021   Procedure: LEFT HEART CATH AND CORONARY ANGIOGRAPHY;  Surgeon: Yvonne Kendall, MD;  [facility]: [facility] INVASIVE CV LAB;  Service: Cardiovascular;  Laterality: N/A;   ROTATOR CUFF REPAIR Right    TEE WITHOUT CARDIOVERSION  05/08/2021   Procedure: TRANSESOPHAGEAL ECHOCARDIOGRAM (TEE);  Surgeon: Corliss Skains, MD;  Location: [facility] OR;  Service: Open Heart Surgery;;   TONSILLECTOMY       Current Outpatient Medications:    amLODipine (NORVASC) 10 MG tablet, Take 1 tablet (10 mg total) by mouth daily., Disp: , Rfl:    aspirin (ASPIRIN 81) 81 MG chewable tablet, Chew 81 mg by mouth daily., Disp: , Rfl:    atorvastatin (LIPITOR) 40 MG tablet, Take 1 tablet (40 mg total) by mouth daily., Disp: 90 tablet, Rfl: 3   clindamycin (CLEOCIN T) 1 % external solution, As needed., Disp: , Rfl:    ezetimibe (ZETIA) 10 MG tablet, Take 1 tablet (10 mg total) by mouth daily., Disp: 90 tablet, Rfl: 3   famotidine-calcium carbonate-magnesium hydroxide (PEPCID COMPLETE) 10-800-165 MG chewable tablet, Chew 1 tablet by mouth 2 (two) times daily as needed., Disp: 100 tablet, Rfl:  11   fluorometholone (FML) 0.1 % ophthalmic suspension, SMARTSIG:In Eye(s), Disp: , Rfl:    Ibuprofen-diphenhydrAMINE Cit (MOTRIN PM PO), Take by mouth., Disp: , Rfl:    losartan (COZAAR) 50 MG tablet, Take 50 mg by mouth daily., Disp: , Rfl:    metoprolol succinate (TOPROL XL) 25 MG 24 hr tablet, Take 1 tablet (25 mg total) by mouth daily., Disp: 90 tablet, Rfl: 1   omeprazole (PRILOSEC) 10 MG capsule, Take 1 capsule (10 mg total) by mouth daily., Disp: 100 capsule, Rfl: 3   tadalafil (CIALIS) 20 MG tablet, Take 0.5 tablets (10 mg total) by mouth daily as needed for erectile  dysfunction., Disp: 90 tablet, Rfl: 3   Turmeric (QC TUMERIC COMPLEX PO), Take by mouth daily., Disp: , Rfl:    fluorouracil (EFUDEX) 5 % cream, SMARTSIG:sparingly Topical Twice Daily (Patient not taking: Reported on 01/03/2023), Disp: , Rfl:   No Known Allergies ROS neg/noncontributory except as noted HPI/below No depression     Objective:     BP 136/85   Pulse (!) 48   Temp 97.9 F (36.6 C) (Temporal)   Resp 16   Ht 5\' 4"  (1.626 m)   Wt 168 lb 6 oz (76.4 kg)   SpO2 98%   BMI 28.90 kg/m  Wt Readings from Last 3 Encounters:  01/03/23 168 lb 6 oz (76.4 kg)  10/15/22 170 lb (77.1 kg)  08/19/22 170 lb 9.6 oz (77.4 kg)    Physical Exam   Gen: WDWN NAD HEENT: NCAT, conjunctiva not injected, sclera nonicteric NECK:  supple, no thyromegaly, no nodes, no carotid bruits CARDIAC:brady RRR, S1S2+, no murmur. DP 2+B LUNGS: CTAB. No wheezes EXT:  no edema MSK: no gross abnormalities.  NEURO: A&O x3.  CN II-XII intact.  PSYCH: normal mood. Good eye contact  Reviewed labs     Assessment & Plan:  Coronary artery disease involving native coronary artery of native heart without angina pectoris Assessment & Plan: Chronic.  Controlled.  No cp.  Followed by card.  Continue atorvastatin 40mg , zetia 10mg , ASA 81mg , metoprolol XL 25mg , losartan 50mg    Primary hypertension Assessment & Plan: Chronic.  Controlled.  Continue losartan 50mg  and metoprolol xl 25mg    Pure hypercholesterolemia Assessment & Plan: Chronic.  Controlled.  Pt w/CAD.  LDL <70.  Continue atorvastatin 40mg  and zetia 10mg    Prediabetes Assessment & Plan: Chronic.  Working on diet/exercise.    Screening for prostate cancer -     PSA; Future    Return in about 6 months (around 07/04/2023) for HTN, cholesterol.  soon lab.  Angelena Sole, MD

## 2023-01-03 NOTE — Assessment & Plan Note (Signed)
Chronic.  Working on diet/exercise

## 2023-01-03 NOTE — Assessment & Plan Note (Signed)
Chronic.  Controlled.  Pt w/CAD.  LDL <70.  Continue atorvastatin 40mg  and zetia 10mg 

## 2023-01-03 NOTE — Assessment & Plan Note (Signed)
Chronic.  Controlled.  No cp.  Followed by card.  Continue atorvastatin 40mg , zetia 10mg , ASA 81mg , metoprolol XL 25mg , losartan 50mg 

## 2023-01-03 NOTE — Patient Instructions (Addendum)
It was very nice to see you today!  Happy Holidays!  Get immunization records   PLEASE NOTE:  If you had any lab tests please let us know if you have not heard back within a few days. You may see your results on MyChart before we have a chance to review them but we will give you a call once they are reviewed by [place]. If we ordered any referrals today, please let us know if you have not heard from their office within the next week.   Please try these tips to maintain a healthy lifestyle:  Eat most of your calories during the day when you are active. Eliminate processed foods including packaged sweets (pies, cakes, cookies), reduce intake of potatoes, white bread, white pasta, and white rice. Look for whole grain options, oat flour or almond flour.  Each meal should contain half fruits/vegetables, one quarter protein, and one quarter carbs (no bigger than a computer mouse).  Cut down on sweet beverages. This includes juice, soda, and sweet tea. Also watch fruit intake, though this is a healthier sweet option, it still contains natural sugar! Limit to 3 servings daily.  Drink at least 1 glass of water with each meal and aim for at least 8 glasses per day  Exercise at least 150 minutes every week.

## 2023-01-09 ENCOUNTER — Encounter: Payer: Self-pay | Admitting: Family Medicine

## 2023-01-09 ENCOUNTER — Other Ambulatory Visit: Payer: Medicare Other

## 2023-01-09 DIAGNOSIS — Z125 Encounter for screening for malignant neoplasm of prostate: Secondary | ICD-10-CM

## 2023-01-09 LAB — PSA: PSA: 1.99 ng/mL (ref 0.10–4.00)

## 2023-01-09 NOTE — Progress Notes (Signed)
PSA normal.   Please change pcp to me

## 2023-01-10 ENCOUNTER — Other Ambulatory Visit: Payer: Self-pay | Admitting: *Deleted

## 2023-01-10 MED ORDER — LOSARTAN POTASSIUM 50 MG PO TABS: 50.0000 mg | ORAL_TABLET | Freq: Every day | ORAL | 3 refills | Status: DC

## 2023-01-10 NOTE — Telephone Encounter (Signed)
PCP changed to [facility].

## 2023-02-14 ENCOUNTER — Encounter (HOSPITAL_BASED_OUTPATIENT_CLINIC_OR_DEPARTMENT_OTHER): Payer: Self-pay

## 2023-02-14 ENCOUNTER — Encounter: Payer: Self-pay | Admitting: Family Medicine

## 2023-02-14 DIAGNOSIS — I10 Essential (primary) hypertension: Secondary | ICD-10-CM

## 2023-02-20 ENCOUNTER — Encounter: Payer: Self-pay | Admitting: Dermatology

## 2023-02-20 ENCOUNTER — Ambulatory Visit: Payer: Medicare Other | Admitting: Dermatology

## 2023-02-20 VITALS — BP 131/83 | HR 50

## 2023-02-20 DIAGNOSIS — L820 Inflamed seborrheic keratosis: Secondary | ICD-10-CM | POA: Diagnosis not present

## 2023-02-20 DIAGNOSIS — L570 Actinic keratosis: Secondary | ICD-10-CM | POA: Diagnosis not present

## 2023-02-20 DIAGNOSIS — L578 Other skin changes due to chronic exposure to nonionizing radiation: Secondary | ICD-10-CM | POA: Diagnosis not present

## 2023-02-20 DIAGNOSIS — L821 Other seborrheic keratosis: Secondary | ICD-10-CM

## 2023-02-20 NOTE — Patient Instructions (Addendum)

## 2023-02-20 NOTE — Progress Notes (Signed)
   Follow-Up Visit   Subjective  Nathaniel Bender. is a 73 y.o. male who presents for the following: AK - He finished Fluorouracil cream 1 week ago (prescribed by another physician)  Patient present today for follow up visit for AK. Patient was last evaluated on 08/20/22. Patient reports sxs are better. Patient denies medication changes.  The following portions of the chart were reviewed this encounter and updated as appropriate: medications, allergies, medical history  Review of Systems:  No other skin or systemic complaints except as noted in HPI or Assessment and Plan.  Objective  Well appearing patient in no apparent distress; mood and affect are within normal limits.  A focused examination was performed of the following areas: Face  Relevant exam findings are noted in the Assessment and Plan.  Nose, left ear, left forearm, right forearm (5) Erythematous thin papules/macules with gritty scale.  Right cheek (2) Erythematous stuck-on, waxy papule or plaque  Assessment & Plan   SEBORRHEIC KERATOSIS - Stuck-on, waxy, tan-brown papules and/or plaques  - Benign-appearing - Discussed benign etiology and prognosis. - Observe - Call for any changes  Actinic Keratoses and Actinic Damage Assessment: Patient completed Efudex (fluorouracil) treatment 1 week ago, resulting in redness across forehead and cheekbones. Current examination reveals smooth skin with tiny spots on earlobe and nose, indicating residual or new actinic keratoses.   Plan:  Perform cryotherapy with liquid nitrogen on actinic keratoses on nose and left earlobe.   Consider spot treatment with Efudex on arms if patient desires. Instruct patient to apply Aquaphor healing ointment to treated areas. Monitor treated areas, especially near eye, and return in 2-3 months if not resolved.   Schedule follow-up in 6 months for yearly skin check.  AK (ACTINIC KERATOSIS) (5) Nose, left ear, left forearm, right forearm (5) Good  results with 5FU cream. Destruction of lesion - Nose, left ear, left forearm, right forearm (5) Complexity: simple   Destruction method: cryotherapy   Informed consent: discussed and consent obtained   Timeout:  patient name, date of birth, surgical site, and procedure verified Lesion destroyed using liquid nitrogen: Yes   Region frozen until ice ball extended beyond lesion: Yes   Outcome: patient tolerated procedure well with no complications   Post-procedure details: wound care instructions given   INFLAMED SEBORRHEIC KERATOSIS (2) Right cheek (2) Symptomatic, irritating, patient would like treated.  Benign-appearing.  Call clinic for new or changing lesions.   Destruction of lesion - Right cheek (2) Complexity: simple   Destruction method: cryotherapy   Informed consent: discussed and consent obtained   Timeout:  patient name, date of birth, surgical site, and procedure verified Lesion destroyed using liquid nitrogen: Yes   Region frozen until ice ball extended beyond lesion: Yes   Outcome: patient tolerated procedure well with no complications   Post-procedure details: wound care instructions given    Return in about 6 months (around 08/20/2023) for TBSE.    Documentation: I have reviewed the above documentation for accuracy and completeness, and I agree with the above.  I, Joanie Coddington, CMA, am acting as scribe for Langston Reusing, DO .    Langston Reusing, DO

## 2023-02-24 MED ORDER — AMLODIPINE BESYLATE 10 MG PO TABS: 10.0000 mg | ORAL_TABLET | Freq: Every day | ORAL | 3 refills | Status: DC

## 2023-02-25 ENCOUNTER — Telehealth: Payer: Medicare Other | Admitting: Physician Assistant

## 2023-02-25 DIAGNOSIS — M109 Gout, unspecified: Secondary | ICD-10-CM

## 2023-02-25 MED ORDER — COLCHICINE 0.6 MG PO TABS: ORAL_TABLET | ORAL | 0 refills | Status: DC

## 2023-02-25 NOTE — Progress Notes (Signed)
E-Visit for Gout Symptoms  We are sorry that you are not feeling well. We are here to help!  Based on what you shared with me it looks like you have a flare of your gout.  Gout is a form of arthritis. It can cause pain and swelling in the joints. At first, it tends to affect only 1 joint - most frequently the big toe. It happens in people who have too much uric acid in the blood. Uric acid is a chemical that is produced when the body breaks down certain foods. Uric acid can form sharp needle-like crystals that build up in the joints and cause pain. Uric acid crystals can also form inside the tubes that carry urine from the kidneys to the bladder. These crystals can turn into "kidney stones" that can cause pain and problems with the flow of urine. People with gout get sudden "flares" or attacks of severe pain, most often the big toe, ankle, or knee. Often the joint also turns red and swells. Usually, only 1 joint is affected, but some people have pain in more than 1 joint. Gout flares tend to happen more often during the night.  The pain from gout can be extreme. The pain and swelling are worst at the beginning of a gout flare. The symptoms then get better within a few days to weeks. It is not clear how the body "turns off" a gout flare.  Do not start any NEW preventative medicine until the gout has cleared completely. However, If you are already on Probenecid or Allopurinol for CHRONIC gout, you may continue taking this during an active flare up  I have prescribed Colchicine 0.6 mg tabs - Take 2 tabs immediately, then 1 tab twice per day for the duration of the flare up to a max of 7 days (but discontinue for stomach pains or diarrhea)    HOME CARE Losing weight can help relieve gout. It's not clear that following a specific diet plan will help with gout symptoms but eating a balanced diet can help improve your overall health. It can also help you lose weight, if you are overweight. In general, a  healthy diet includes plenty of fruits, vegetables, whole grains, and low-fat dairy products (labelled "low fat", skim, 2%). Avoid sugar sweetened drinks (including sodas, tea, juice and juice blends, coffee drinks and sports drinks) Limit alcohol to 1-2 drinks of beer, spirits or wine daily these can make gout flares worse. Some people with gout also have other health problems, such as heart disease, high blood pressure, kidney disease, or obesity. If you have any of these issues, it's important to work with your doctor to manage them. This can help improve your overall health and might also help with your gout.  GET HELP RIGHT AWAY IF: Your symptoms persist after you have completed your treatment plan You develop severe diarrhea You develop abnormal sensations  You develop vomiting,   You develop weakness  You develop abdominal pain  FOLLOW UP WITH YOUR PRIMARY PROVIDER IF: If your symptoms do not improve within 10 days  MAKE SURE YOU  Understand these instructions. Will watch your condition. Will get help right away if you are not doing well or get worse.  Thank you for choosing an e-visit.  Your e-visit answers were reviewed by a board certified advanced clinical practitioner to complete your personal care plan. Depending upon the condition, your plan could have included both over the counter or prescription medications.  Please review your  pharmacy choice. Make sure the pharmacy is open so you can pick up prescription now. If there is a problem, you may contact your provider through [employer] and have the prescription routed to another pharmacy.  Your safety is important to [place]. If you have drug allergies check your prescription carefully.   For the next 24 hours you can use MyChart to ask questions about today's visit, request a non-urgent call back, or ask for a work or school excuse. You will get an email in the next two days asking about your experience. I hope that your  e-visit has been valuable and will speed your recovery.   I have spent 5 minutes in review of e-visit questionnaire, review and updating patient chart, medical decision making and response to patient.   Loraine Grip Mayers, PA-C

## 2023-03-14 ENCOUNTER — Telehealth: Payer: Medicare Other | Admitting: Physician Assistant

## 2023-03-14 DIAGNOSIS — R051 Acute cough: Secondary | ICD-10-CM

## 2023-03-14 MED ORDER — PREDNISONE 20 MG PO TABS: 40.0000 mg | ORAL_TABLET | Freq: Every day | ORAL | 0 refills | Status: AC

## 2023-03-14 MED ORDER — PREDNISONE 20 MG PO TABS: 40.0000 mg | ORAL_TABLET | Freq: Every day | ORAL | 0 refills | Status: DC

## 2023-03-14 MED ORDER — BENZONATATE 100 MG PO CAPS: 100.0000 mg | ORAL_CAPSULE | Freq: Three times a day (TID) | ORAL | 0 refills | Status: DC | PRN

## 2023-03-14 NOTE — Progress Notes (Signed)
E-Visit for Cough   We are sorry that you are not feeling well.  Here is how we plan to help!  Based on your presentation I believe you most likely have A cough due to a virus.  This is called viral bronchitis and is best treated by rest, plenty of fluids and control of the cough.  You may use Ibuprofen or Tylenol as directed to help your symptoms.     In addition you may use A non-prescription cough medication called Robitussin DAC. Take 2 teaspoons every 8 hours or Delsym: take 2 teaspoons every 12 hours., A non-prescription cough medication called Mucinex DM: take 2 tablets every 12 hours., and A prescription cough medication called Tessalon Perles 100mg . You may take 1-2 capsules every 8 hours as needed for your cough.  A short course of prednisone.   From your responses in the eVisit questionnaire you describe inflammation in the upper respiratory tract which is causing a significant cough.  This is commonly called Bronchitis and has four common causes:   Allergies Viral Infections Acid Reflux Bacterial Infection Allergies, viruses and acid reflux are treated by controlling symptoms or eliminating the cause. An example might be a cough caused by taking certain blood pressure medications. You stop the cough by changing the medication. Another example might be a cough caused by acid reflux. Controlling the reflux helps control the cough.  USE OF BRONCHODILATOR ("RESCUE") INHALERS: There is a risk from using your bronchodilator too frequently.  The risk is that over-reliance on a medication which only relaxes the muscles surrounding the breathing tubes can reduce the effectiveness of medications prescribed to reduce swelling and congestion of the tubes themselves.  Although you feel brief relief from the bronchodilator inhaler, your asthma may actually be worsening with the tubes becoming more swollen and filled with mucus.  This can delay other crucial treatments, such as oral steroid  medications. If you need to use a bronchodilator inhaler daily, several times per day, you should discuss this with your provider.  There are probably better treatments that could be used to keep your asthma under control.     HOME CARE Only take medications as instructed by your medical team. Complete the entire course of an antibiotic. Drink plenty of fluids and get plenty of rest. Avoid close contacts especially the very young and the elderly Cover your mouth if you cough or cough into your sleeve. Always remember to wash your hands A steam or ultrasonic humidifier can help congestion.   GET HELP RIGHT AWAY IF: You develop worsening fever. You become short of breath You cough up blood. Your symptoms persist after you have completed your treatment plan MAKE SURE YOU  Understand these instructions. Will watch your condition. Will get help right away if you are not doing well or get worse.    Thank you for choosing an e-visit.  Your e-visit answers were reviewed by a board certified advanced clinical practitioner to complete your personal care plan. Depending upon the condition, your plan could have included both over the counter or prescription medications.  Please review your pharmacy choice. Make sure the pharmacy is open so you can pick up prescription now. If there is a problem, you may contact your provider through [employer] and have the prescription routed to another pharmacy.  Your safety is important to [place]. If you have drug allergies check your prescription carefully.   For the next 24 hours you can use MyChart to ask questions about today's  visit, request a non-urgent call back, or ask for a work or school excuse. You will get an email in the next two days asking about your experience. I hope that your e-visit has been valuable and will speed your recovery.  I have spent 5 minutes in review of e-visit questionnaire, review and updating patient chart, medical decision  making and response to patient.   Tylene Fantasia Ward, PA-C

## 2023-03-14 NOTE — Addendum Note (Signed)
Addended by: Georgana Curio on: 03/14/2023 06:18 PM   Modules accepted: Orders

## 2023-03-17 ENCOUNTER — Telehealth: Payer: Medicare Other | Admitting: Physician Assistant

## 2023-03-17 DIAGNOSIS — U071 COVID-19: Secondary | ICD-10-CM

## 2023-03-17 NOTE — Progress Notes (Signed)
 E-Visit for Cough   We are sorry that you are not feeling well.  Here is how we plan to help!  I recommend completing your prednisone.   If your symptoms are improving you may not need antiviral like Paxlovid.  COVID typically last 5-7 days. Please let me know how your symptoms are today.   From your responses in the eVisit questionnaire you describe inflammation in the upper respiratory tract which is causing a significant cough.  This is commonly called Bronchitis and has four common causes:   Allergies Viral Infections Acid Reflux Bacterial Infection Allergies, viruses and acid reflux are treated by controlling symptoms or eliminating the cause. An example might be a cough caused by taking certain blood pressure medications. You stop the cough by changing the medication. Another example might be a cough caused by acid reflux. Controlling the reflux helps control the cough.  USE OF BRONCHODILATOR ("RESCUE") INHALERS: There is a risk from using your bronchodilator too frequently.  The risk is that over-reliance on a medication which only relaxes the muscles surrounding the breathing tubes can reduce the effectiveness of medications prescribed to reduce swelling and congestion of the tubes themselves.  Although you feel brief relief from the bronchodilator inhaler, your asthma may actually be worsening with the tubes becoming more swollen and filled with mucus.  This can delay other crucial treatments, such as oral steroid medications. If you need to use a bronchodilator inhaler daily, several times per day, you should discuss this with your provider.  There are probably better treatments that could be used to keep your asthma under control.     HOME CARE Only take medications as instructed by your medical team. Complete the entire course of an antibiotic. Drink plenty of fluids and get plenty of rest. Avoid close contacts especially the very young and the elderly Cover your mouth if you  cough or cough into your sleeve. Always remember to wash your hands A steam or ultrasonic humidifier can help congestion.   GET HELP RIGHT AWAY IF: You develop worsening fever. You become short of breath You cough up blood. Your symptoms persist after you have completed your treatment plan MAKE SURE YOU  Understand these instructions. Will watch your condition. Will get help right away if you are not doing well or get worse.    Thank you for choosing an e-visit.  Your e-visit answers were reviewed by a board certified advanced clinical practitioner to complete your personal care plan. Depending upon the condition, your plan could have included both over the counter or prescription medications.  Please review your pharmacy choice. Make sure the pharmacy is open so you can pick up prescription now. If there is a problem, you may contact your provider through [employer] and have the prescription routed to another pharmacy.  Your safety is important to [place]. If you have drug allergies check your prescription carefully.   For the next 24 hours you can use MyChart to ask questions about today's visit, request a non-urgent call back, or ask for a work or school excuse. You will get an email in the next two days asking about your experience. I hope that your e-visit has been valuable and will speed your recovery.   I have spent 5 minutes in review of e-visit questionnaire, review and updating patient chart, medical decision making and response to patient.   Tylene Fantasia Ward, PA-C

## 2023-03-18 ENCOUNTER — Encounter (HOSPITAL_BASED_OUTPATIENT_CLINIC_OR_DEPARTMENT_OTHER): Payer: Self-pay

## 2023-03-18 MED ORDER — EZETIMIBE 10 MG PO TABS: 10.0000 mg | ORAL_TABLET | Freq: Every day | ORAL | 3 refills | Status: AC

## 2023-03-23 ENCOUNTER — Encounter: Payer: Self-pay | Admitting: Family Medicine

## 2023-03-28 ENCOUNTER — Other Ambulatory Visit: Payer: Self-pay | Admitting: Family

## 2023-03-28 ENCOUNTER — Encounter: Payer: Self-pay | Admitting: Family Medicine

## 2023-03-28 ENCOUNTER — Telehealth: Admitting: Family Medicine

## 2023-03-28 DIAGNOSIS — M109 Gout, unspecified: Secondary | ICD-10-CM

## 2023-03-28 MED ORDER — METHYLPREDNISOLONE 4 MG PO TBPK: ORAL_TABLET | ORAL | 0 refills | Status: DC

## 2023-03-28 MED ORDER — COLCHICINE 0.6 MG PO TABS: ORAL_TABLET | ORAL | 0 refills | Status: DC

## 2023-03-28 NOTE — Progress Notes (Signed)
 Pending question to patient. DWB

## 2023-03-28 NOTE — Addendum Note (Signed)
 Addended by: Margaretann Loveless on: 03/28/2023 04:07 PM   Modules accepted: Orders, Level of Service

## 2023-03-28 NOTE — Progress Notes (Signed)
 E-Visit for Gout Symptoms  We are sorry that you are not feeling well. We are here to help!  Based on what you shared with me it looks like you have a flare of your gout.  Gout is a form of arthritis. It can cause pain and swelling in the joints. At first, it tends to affect only 1 joint - most frequently the big toe. It happens in people who have too much uric acid in the blood. Uric acid is a chemical that is produced when the body breaks down certain foods. Uric acid can form sharp needle-like crystals that build up in the joints and cause pain. Uric acid crystals can also form inside the tubes that carry urine from the kidneys to the bladder. These crystals can turn into "kidney stones" that can cause pain and problems with the flow of urine. People with gout get sudden "flares" or attacks of severe pain, most often the big toe, ankle, or knee. Often the joint also turns red and swells. Usually, only 1 joint is affected, but some people have pain in more than 1 joint. Gout flares tend to happen more often during the night.  The pain from gout can be extreme. The pain and swelling are worst at the beginning of a gout flare. The symptoms then get better within a few days to weeks. It is not clear how the body "turns off" a gout flare.  Do not start any NEW preventative medicine until the gout has cleared completely. However, If you are already on Probenecid or Allopurinol for CHRONIC gout, you may continue taking this during an active flare up  I have prescribed Colchicine 0.6 mg tabs - Take 2 tabs immediately, then 1 tab twice per day for the duration of the flare up to a max of 7 days (but discontinue for stomach pains or diarrhea)    HOME CARE Losing weight can help relieve gout. It's not clear that following a specific diet plan will help with gout symptoms but eating a balanced diet can help improve your overall health. It can also help you lose weight, if you are overweight. In general, a  healthy diet includes plenty of fruits, vegetables, whole grains, and low-fat dairy products (labelled "low fat", skim, 2%). Avoid sugar sweetened drinks (including sodas, tea, juice and juice blends, coffee drinks and sports drinks) Limit alcohol to 1-2 drinks of beer, spirits or wine daily these can make gout flares worse. Some people with gout also have other health problems, such as heart disease, high blood pressure, kidney disease, or obesity. If you have any of these issues, it's important to work with your doctor to manage them. This can help improve your overall health and might also help with your gout.  GET HELP RIGHT AWAY IF: Your symptoms persist after you have completed your treatment plan You develop severe diarrhea You develop abnormal sensations  You develop vomiting,   You develop weakness  You develop abdominal pain  FOLLOW UP WITH YOUR PRIMARY PROVIDER IF: If your symptoms do not improve within 10 days  MAKE SURE YOU  Understand these instructions. Will watch your condition. Will get help right away if you are not doing well or get worse.  Thank you for choosing an e-visit.  Your e-visit answers were reviewed by a board certified advanced clinical practitioner to complete your personal care plan. Depending upon the condition, your plan could have included both over the counter or prescription medications.  Please review your  pharmacy choice. Make sure the pharmacy is open so you can pick up prescription now. If there is a problem, you may contact your provider through [employer] and have the prescription routed to another pharmacy.  Your safety is important to [place]. If you have drug allergies check your prescription carefully.   For the next 24 hours you can use MyChart to ask questions about today's visit, request a non-urgent call back, or ask for a work or school excuse. You will get an email in the next two days asking about your experience. I hope that your  e-visit has been valuable and will speed your recovery.    I have spent 5 minutes in review of e-visit questionnaire, review and updating patient chart, medical decision making and response to patient.   Margaretann Loveless, PA-C

## 2023-04-02 ENCOUNTER — Encounter: Payer: Self-pay | Admitting: Family Medicine

## 2023-04-14 ENCOUNTER — Ambulatory Visit (INDEPENDENT_AMBULATORY_CARE_PROVIDER_SITE_OTHER): Admitting: Family Medicine

## 2023-04-14 ENCOUNTER — Encounter: Payer: Self-pay | Admitting: Family Medicine

## 2023-04-14 VITALS — BP 140/72 | HR 46 | Temp 97.2°F | Ht 64.0 in | Wt 166.2 lb

## 2023-04-14 DIAGNOSIS — N521 Erectile dysfunction due to diseases classified elsewhere: Secondary | ICD-10-CM | POA: Diagnosis not present

## 2023-04-14 DIAGNOSIS — M109 Gout, unspecified: Secondary | ICD-10-CM

## 2023-04-14 LAB — URIC ACID: Uric Acid, Serum: 5.4 mg/dL (ref 4.0–7.8)

## 2023-04-14 MED ORDER — INDOMETHACIN 50 MG PO CAPS: 50.0000 mg | ORAL_CAPSULE | Freq: Three times a day (TID) | ORAL | 1 refills | Status: DC | PRN

## 2023-04-14 MED ORDER — SILDENAFIL CITRATE 100 MG PO TABS
100.0000 mg | ORAL_TABLET | Freq: Every day | ORAL | 3 refills | Status: AC | PRN
Start: 2023-04-14 — End: ?

## 2023-04-14 NOTE — Progress Notes (Signed)
 Subjective:     Patient ID: Nathaniel Bender., male    DOB: 04/23/1950, 73 y.o.   MRN: [id number]  Chief Complaint  Patient presents with   Toe Pain    Pt c.o of right toe pain - has happened twice in 2 weeks, comes and goes - pt think it may be gout flare-ups. Pt has tried Colchicine, helped, but gives pt diarrhea    Hypertension   Hyperlipidemia    HPI Discussed the use of AI scribe software for clinical note transcription with the patient, who gave verbal consent to proceed.  History of Present Illness Nathaniel Bender. "Annette Stable" is a 73 year old male with hypertension who presents for evaluation of recent gout flares and foot pain.  He has experienced two recent gout flare-ups in the last six weeks, both affecting the right great toe at the base. The first flare occurred in early March, waking him at 2 AM with severe pain, which he recognized from a previous episode years ago. He sought online medical advice and was prescribed colchicine, which he took twice, resulting in symptom relief. However, the medication caused diarrhea for about 48 hours. The second flare occurred two weeks later, and he used leftover colchicine, which again alleviated the symptoms within 24 hours. No recent dietary changes or known triggers such as alcohol or seafood consumption have been identified. His father had a history of gout.  The joint was just painful-NO redness/swelling  He experiences chronic foot pain, described as feeling like 'pads of fluid' in front of the metatarsals on both feet, which is exacerbated by walking on hard surfaces without padding. He has tried various treatments, including steroid injections, physical therapy, and orthotics, with limited success. He has seen multiple specialists, including podiatrists and orthopedists, and has undergone imaging studies, which have not provided a definitive diagnosis. The pain has persisted for two years without significant improvement.  He has a  history of hypertension and is currently taking losartan. He was previously prescribed Medrol for foot pain, which he completed last week. He has not been taking metoprolol, as it was never started. His blood pressure readings at home were around 130/75 to 130/80, but he has not checked it recently due to being out of town. He acknowledges potential dietary and lifestyle changes during a recent guitar camp that may have affected his blood pressure.  He occasionally uses tadalafil for erectile dysfunction, approximately once a month, but reports it causes dizziness and feels like his blood pressure drops. He previously tried Viagra about ten years ago, which was effective. He is open to trying alternative medications for erectile dysfunction.    Health Maintenance Due  Topic Date Due   Zoster Vaccines- Shingrix (2 of 2) 03/30/2022   COVID-19 Vaccine (7 - Pfizer risk 2024-25 season) 03/30/2023    Past Medical History:  Diagnosis Date   Acute respiratory failure with hypoxia (HCC)    Arthritis    Degenerative Disc Disease Cervical; s/p injection  Ortho.   Atypical mole 05/04/2013   Right Wrist-Severe   CAD (coronary artery disease)    Chest pain 05/06/2021   Colon polyps    Hyperlipidemia    Hypertension    Kidney stone    Meniere's disease of left ear    s/p ENT evaluation; acute hearing loss; chronic tinnitus L now.  Recovered hearing completely.   Sudden idiopathic hearing loss 11/20/2011   Seen by [facility] 10/2011      Past Surgical History:  Procedure Laterality Date   CLIPPING OF ATRIAL APPENDAGE Left 05/08/2021   Procedure: CLIPPING OF ATRIAL APPENDAGE;  Surgeon: Corliss Skains, MD;  Location: [facility] OR;  Service: Open Heart Surgery;  Laterality: Left;   CORONARY ARTERY BYPASS GRAFT N/A 05/08/2021   Procedure: CORONARY ARTERY BYPASS GRAFTING x4 USING LEFT INTERNAL MAMMORY ARTERY AND BILATERAL GREATER SAPHENOUS VEIN;  Surgeon: Corliss Skains, MD;  Location:  [facility] OR;  Service: Open Heart Surgery;  Laterality: N/A;  atriclip placement.  flow trac   ENDOVEIN HARVEST OF GREATER SAPHENOUS VEIN Bilateral 05/08/2021   Procedure: ENDOVEIN HARVEST OF GREATER SAPHENOUS VEIN;  Surgeon: Corliss Skains, MD;  Location: [facility] OR;  Service: Open Heart Surgery;  Laterality: Bilateral;   LEFT HEART CATH AND CORONARY ANGIOGRAPHY N/A 05/07/2021   Procedure: LEFT HEART CATH AND CORONARY ANGIOGRAPHY;  Surgeon: Yvonne Kendall, MD;  Location: [facility];  Service: Cardiovascular;  Laterality: N/A;   ROTATOR CUFF REPAIR Right    TEE WITHOUT CARDIOVERSION  05/08/2021   Procedure: TRANSESOPHAGEAL ECHOCARDIOGRAM (TEE);  Surgeon: Corliss Skains, MD;  Location: [facility] OR;  Service: Open Heart Surgery;;   TONSILLECTOMY       Current Outpatient Medications:    amLODipine (NORVASC) 10 MG tablet, Take 1 tablet (10 mg total) by mouth daily., Disp: 90 tablet, Rfl: 3   aspirin (ASPIRIN 81) 81 MG chewable tablet, Chew 81 mg by mouth daily., Disp: , Rfl:    atorvastatin (LIPITOR) 40 MG tablet, Take 1 tablet (40 mg total) by mouth daily., Disp: 90 tablet, Rfl: 3   clindamycin (CLEOCIN T) 1 % external solution, As needed., Disp: , Rfl:    colchicine 0.6 MG tablet, Take 2 tabs immediately, then 1 tab twice per day for the duration of the flare up to a max of 7 days (but discontinue for stomach pains or diarrhea), Disp: 14 tablet, Rfl: 0   ezetimibe (ZETIA) 10 MG tablet, Take 1 tablet (10 mg total) by mouth daily., Disp: 90 tablet, Rfl: 3   famotidine-calcium carbonate-magnesium hydroxide (PEPCID COMPLETE) 10-800-165 MG chewable tablet, Chew 1 tablet by mouth 2 (two) times daily as needed., Disp: 100 tablet, Rfl: 11   Ibuprofen-diphenhydrAMINE Cit (MOTRIN PM PO), Take by mouth., Disp: , Rfl:    indomethacin (INDOCIN) 50 MG capsule, Take 1 capsule (50 mg total) by mouth 3 (three) times daily as needed., Disp: 15 capsule, Rfl: 1   losartan (COZAAR) 50 MG tablet, Take 1 tablet (50  mg total) by mouth daily., Disp: 90 tablet, Rfl: 3   omeprazole (PRILOSEC) 10 MG capsule, Take 1 capsule (10 mg total) by mouth daily., Disp: 100 capsule, Rfl: 3   sildenafil (VIAGRA) 100 MG tablet, Take 1 tablet (100 mg total) by mouth daily as needed for erectile dysfunction., Disp: 10 tablet, Rfl: 3   Turmeric (QC TUMERIC COMPLEX PO), Take by mouth daily., Disp: , Rfl:    metoprolol succinate (TOPROL XL) 25 MG 24 hr tablet, Take 1 tablet (25 mg total) by mouth daily. (Patient not taking: Reported on 04/14/2023), Disp: 90 tablet, Rfl: 1  No Known Allergies ROS neg/noncontributory except as noted HPI/below      Objective:     BP (!) 140/72 (BP Location: Left Arm, Patient Position: Sitting, Cuff Size: Large)   Pulse (!) 46   Temp (!) 97.2 F (36.2 C)   Ht 5\' 4"  (1.626 m)   Wt 166 lb 3.2 oz (75.4 kg)   SpO2 99%   BMI 28.53 kg/m  Wt Readings from Last 3 Encounters:  04/14/23 166 lb 3.2 oz (75.4 kg)  01/03/23 168 lb 6 oz (76.4 kg)  10/15/22 170 lb (77.1 kg)    Physical Exam   Gen: WDWN NAD HEENT: NCAT, conjunctiva not injected, sclera nonicteric EXT:  no edema.  DP 2+ R foot MSK: no gross abnormalities.  Some TTP base R great toe.  No redness/swelling.   NEURO: A&O x3.  CN II-XII intact.  PSYCH: normal mood. Good eye contact     Assessment & Plan:  Acute gout involving toe, unspecified cause, unspecified laterality -     Indomethacin; Take 1 capsule (50 mg total) by mouth 3 (three) times daily as needed.  Dispense: 15 capsule; Refill: 1 -     Uric acid  Erectile dysfunction due to diseases classified elsewhere -     Sildenafil Citrate; Take 1 tablet (100 mg total) by mouth daily as needed for erectile dysfunction.  Dispense: 10 tablet; Refill: 3  Assessment and Plan Assessment & Plan Gout   He experiences recurrent severe pain in the right great toe joint, indicative of possible gout, but without redness/swelling. Colchicine was effective but caused diarrhea. No dietary  triggers identified. Differential diagnosis includes severe arthritis or other inflammatory conditions. A uric acid level will be checked to confirm the diagnosis. Indomethacin is prescribed for future flares, with caution due to potential gastrointestinal side effects. He should avoid ibuprofen while taking indomethacin and use acetaminophen instead.  Chronic Foot Pain   Chronic pain in both feet may be due to inflammation or nerve issues. Previous treatments, including steroid injections, physical therapy, and orthotics, have been ineffective. MRI showed no evidence of Morton's neuroma. No new interventions are suggested as current management strategies have been exhausted. Further specialist consultation may be considered if new symptoms arise.  Hypertension   Blood pressure was elevated during the visit, possibly due to recent travel and dietary changes, though home readings have been normal. He is on losartan and metoprolol for management. He is advised to monitor blood pressure at home over the next several days to determine if the elevation is transient and to continue current medications as prescribed.  Erectile Dysfunction   Tadalafil use results in symptoms suggestive of hypotension and is ineffective in maintaining erections. Previous use of sildenafil was effective. No significant cardiovascular symptoms reported during exercise. Discussed potential class effect of PDE5 inhibitors on blood pressure and alternative options. Sildenafil 100 mg is prescribed, with instructions to start with half a tablet. He is advised to monitor blood pressure when using erectile dysfunction medication and educated on [employer] for potential cost savings.    Return for keep appt in June.  Angelena Sole, MD

## 2023-04-14 NOTE — Patient Instructions (Signed)
 Not sure if gout or other. Indomethacin if flares and keep log.

## 2023-04-14 NOTE — Progress Notes (Signed)
 Uric acid normal-does not rule in nor out gout for sure, but suspect pain may be more arthritis/inflammation of joint or the sesamoid bones.  He may want to f/u sports med/ortho/or pod.

## 2023-05-06 IMAGING — DX DG CHEST 2V
2 series · 2 of 2 positions shown · non-contrast
Comparison: 01/25/2018

CLINICAL DATA: 71-year-old male with a history of recurrent
bronchospasm

EXAM:
CHEST - 2 VIEW

[chest pa]
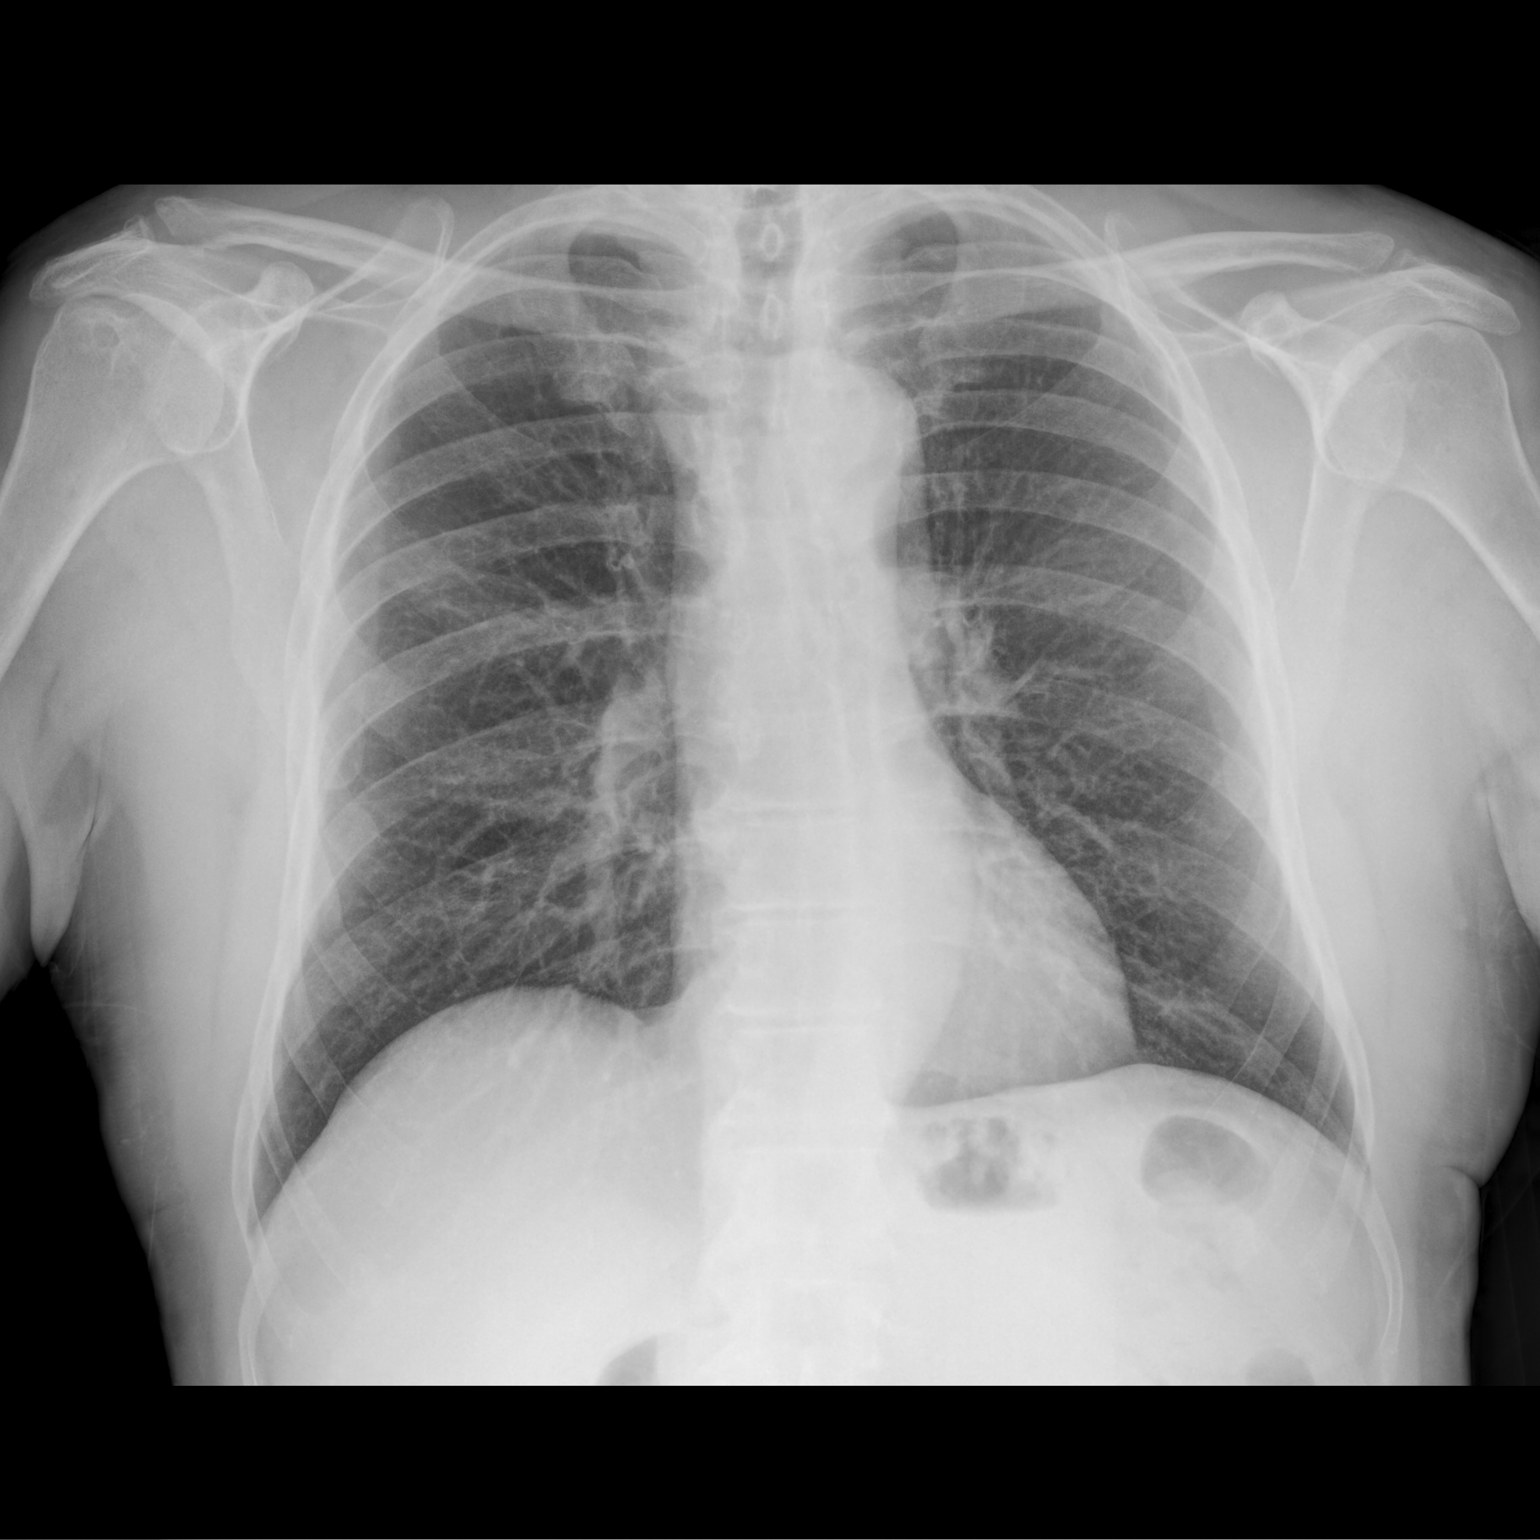

[chest lat]
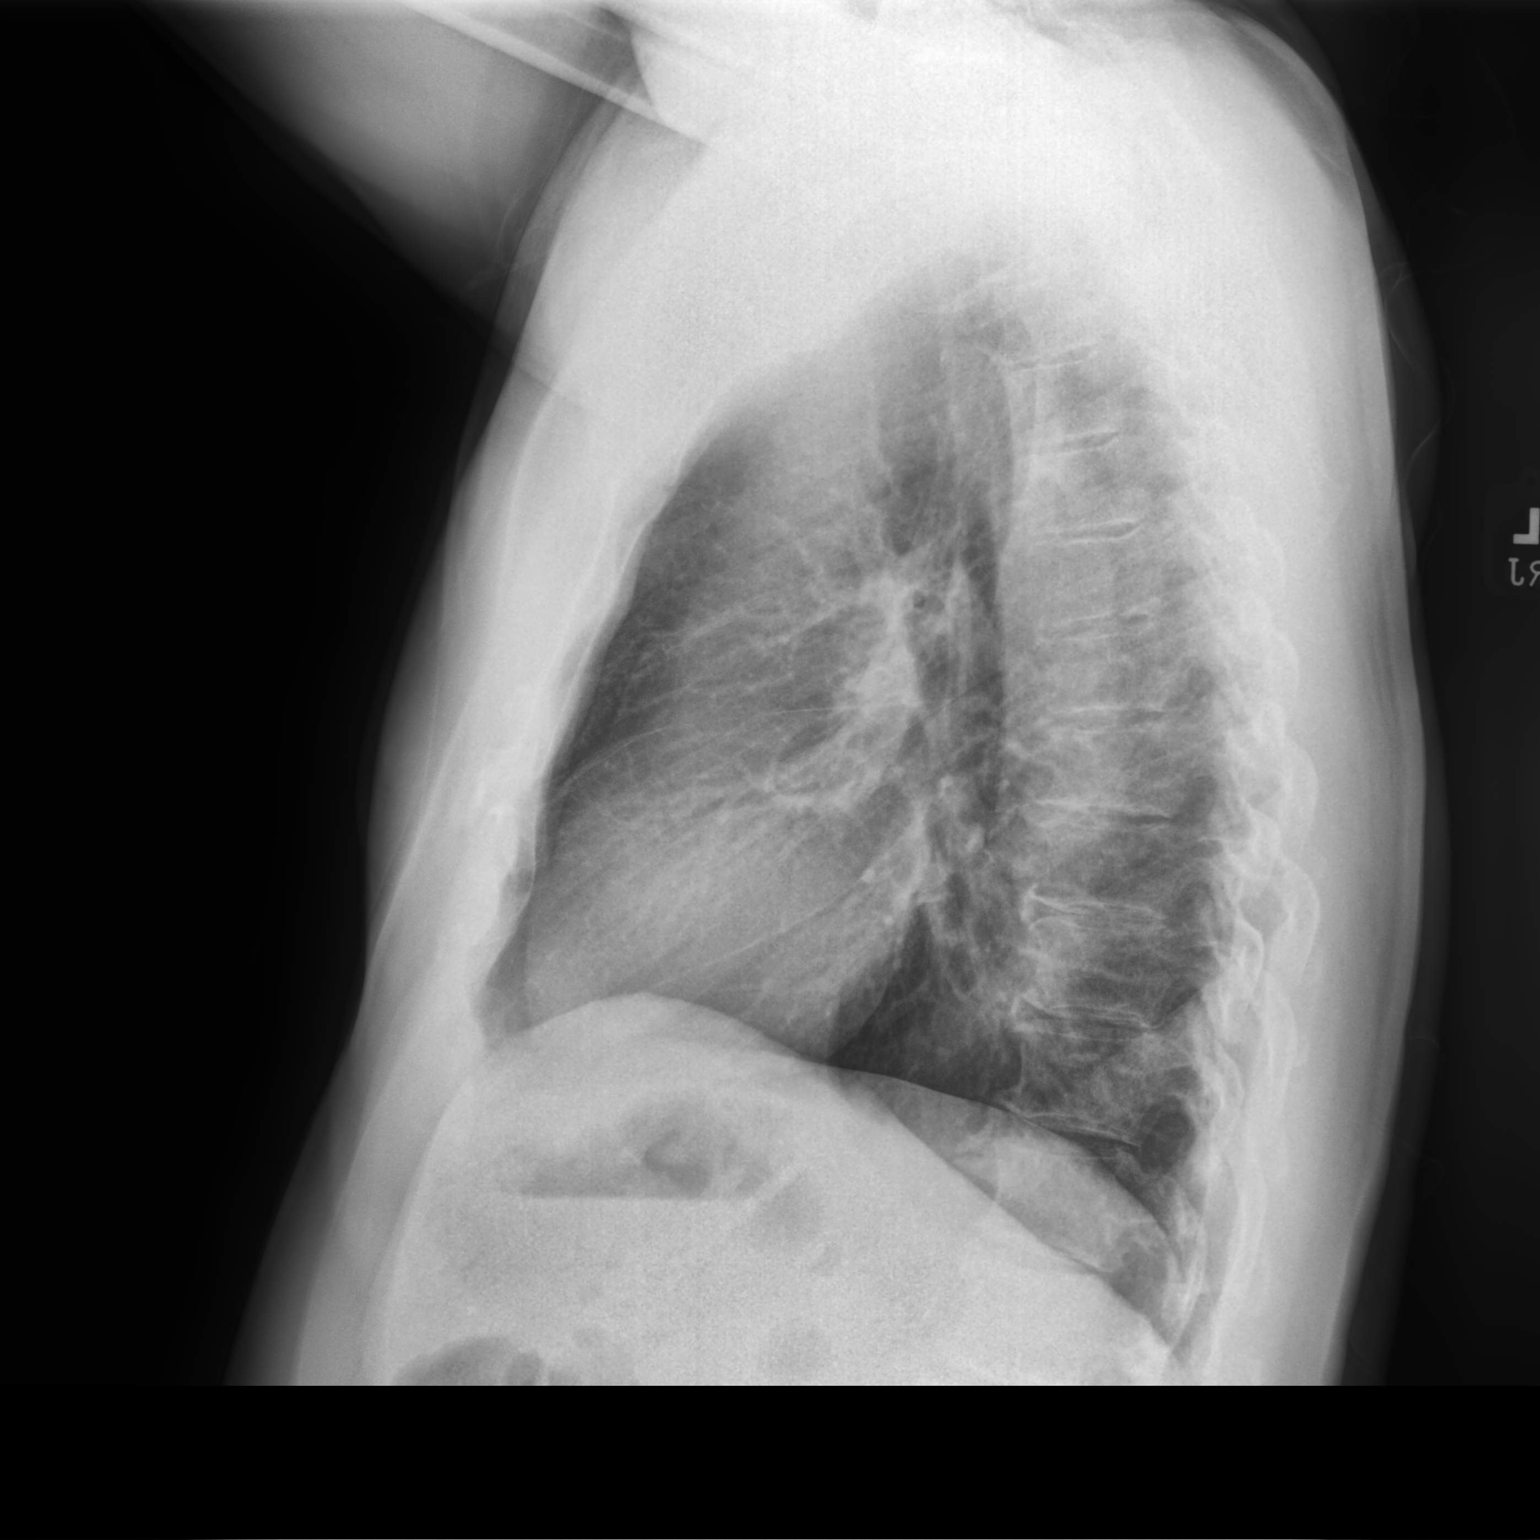

[2 of 2 positions shown; findings below may reference images not displayed]

FINDINGS: Cardiomediastinal silhouette unchanged in size and contour. No
evidence of central vascular congestion. No interlobular septal
thickening.

No pneumothorax or pleural effusion. Coarsened interstitial
markings, with no confluent airspace disease.

No acute displaced fracture. Degenerative changes of the spine.
IMPRESSION: Negative for acute cardiopulmonary disease

## 2023-05-29 ENCOUNTER — Encounter: Payer: Self-pay | Admitting: Family Medicine

## 2023-06-03 ENCOUNTER — Ambulatory Visit: Admitting: Family Medicine

## 2023-06-03 ENCOUNTER — Encounter: Payer: Self-pay | Admitting: Family Medicine

## 2023-06-03 ENCOUNTER — Ambulatory Visit (INDEPENDENT_AMBULATORY_CARE_PROVIDER_SITE_OTHER): Admitting: Family Medicine

## 2023-06-03 VITALS — BP 116/69 | HR 48 | Temp 97.5°F | Resp 18 | Ht 64.0 in | Wt 163.0 lb

## 2023-06-03 DIAGNOSIS — R1310 Dysphagia, unspecified: Secondary | ICD-10-CM

## 2023-06-03 DIAGNOSIS — M7742 Metatarsalgia, left foot: Secondary | ICD-10-CM | POA: Diagnosis not present

## 2023-06-03 DIAGNOSIS — M7741 Metatarsalgia, right foot: Secondary | ICD-10-CM

## 2023-06-03 DIAGNOSIS — K219 Gastro-esophageal reflux disease without esophagitis: Secondary | ICD-10-CM | POA: Diagnosis not present

## 2023-06-03 NOTE — Patient Instructions (Signed)
 May need to increase omeprazole  to 20mg  twice daily  Let me know on podiatry

## 2023-06-03 NOTE — Progress Notes (Signed)
 Subjective:     Patient ID: Nathaniel Eck., male    DOB: 04-Mar-1950, 73 y.o.   MRN: [id number]  Chief Complaint  Patient presents with   Swallowing Issues    Was having difficulty swallowing certain foods   Foot Injury    Old bilateral foot injuries, requesting new referral to podiatry    HPI [name] Discussed the use of AI scribe software for clinical note transcription with the patient, who gave verbal consent to proceed.  History of Present Illness Nathaniel Eck. "[name]" is a 73 year old male who presents with chronic foot pain and recent onset of swallowing difficulties.  He has experienced chronic pain in the bottoms of both feet for two and a half years following an injury during a hiking trip in [place] , attributed to wearing inappropriate footwear for rocky terrain. Despite consulting three podiatrists and undergoing physical therapy, the pain persists. He has received four to five cortisone or steroid injections in his feet without significant relief. The pain is primarily located forward of the ball of the foot and is exacerbated when walking barefoot. He uses pads in his shoes to alleviate discomfort, which allows him to walk more comfortably. An MRI was performed to rule out Morton's neuroma, which was negative. Would like to see pod.   He has recently experienced difficulty swallowing, which began approximately ten to twelve days ago. He describes trouble swallowing foods like potatoes, bread, and apples, with episodes of choking and coughing. The sensation is described as a tightness in the throat, with food feeling as though it might enter the windpipe. This issue has improved over the last few days but persists to some extent. He has been taking over-the-counter allergy medication without noticeable improvement. He continues to take omeprazole  10 mg daily(trying to wean off), occasionally supplemented with Pepcid . No typical reflux  symptoms such as burning. He has a history of COVID-19 three months ago, but the swallowing issue began only two weeks ago. No history of smoking or chewing tobacco. No dark, tarry stools or other signs of gastrointestinal bleeding. He is concerned due to the recent deaths of two friends from throat cancer. Never had EGD that he can recall     Health Maintenance Due  Topic Date Due   Zoster Vaccines- Shingrix (2 of 2) 03/30/2022   COVID-19 Vaccine (7 - Pfizer risk 2024-25 season) 03/30/2023    Past Medical History:  Diagnosis Date   Acute respiratory failure with hypoxia (HCC)    Arthritis    Degenerative Disc Disease Cervical; s/p injection [facility].   Atypical mole 05/04/2013   Right Wrist-Severe   CAD (coronary artery disease)    Chest pain 05/06/2021   Colon polyps    Hyperlipidemia    Hypertension    Kidney stone    Meniere's disease of left ear    s/p ENT evaluation; acute hearing loss; chronic tinnitus L now.  Recovered hearing completely.   Sudden idiopathic hearing loss 11/20/2011   Seen by [facility] 10/2011      Past Surgical History:  Procedure Laterality Date   CLIPPING OF ATRIAL APPENDAGE Left 05/08/2021   Procedure: CLIPPING OF ATRIAL APPENDAGE;  Surgeon: Hilarie Lovely, MD;  Location: [facility] OR;  Service: Open Heart Surgery;  Laterality: Left;   CORONARY ARTERY BYPASS GRAFT N/A 05/08/2021   Procedure: CORONARY ARTERY BYPASS GRAFTING x4 USING LEFT INTERNAL MAMMORY ARTERY AND BILATERAL GREATER SAPHENOUS VEIN;  Surgeon: Deloise Ferries,  Marinell Siad, MD;  Location: [facility] OR;  Service: Open Heart Surgery;  Laterality: N/A;  atriclip placement.  flow trac   ENDOVEIN HARVEST OF GREATER SAPHENOUS VEIN Bilateral 05/08/2021   Procedure: ENDOVEIN HARVEST OF GREATER SAPHENOUS VEIN;  Surgeon: Hilarie Lovely, MD;  Location: [facility] OR;  Service: Open Heart Surgery;  Laterality: Bilateral;   LEFT HEART CATH AND CORONARY ANGIOGRAPHY N/A 05/07/2021   Procedure: LEFT HEART CATH  AND CORONARY ANGIOGRAPHY;  Surgeon: Sammy Crisp, MD;  Location: [facility];  Service: Cardiovascular;  Laterality: N/A;   ROTATOR CUFF REPAIR Right    TEE WITHOUT CARDIOVERSION  05/08/2021   Procedure: TRANSESOPHAGEAL ECHOCARDIOGRAM (TEE);  Surgeon: Hilarie Lovely, MD;  Location: [facility] OR;  Service: Open Heart Surgery;;   TONSILLECTOMY       Current Outpatient Medications:    amLODipine  (NORVASC ) 10 MG tablet, Take 1 tablet (10 mg total) by mouth daily., Disp: 90 tablet, Rfl: 3   aspirin  (ASPIRIN  81) 81 MG chewable tablet, Chew 81 mg by mouth daily., Disp: , Rfl:    atorvastatin  (LIPITOR ) 40 MG tablet, Take 1 tablet (40 mg total) by mouth daily., Disp: 90 tablet, Rfl: 3   clindamycin (CLEOCIN T) 1 % external solution, As needed., Disp: , Rfl:    colchicine  0.6 MG tablet, Take 2 tabs immediately, then 1 tab twice per day for the duration of the flare up to a max of 7 days (but discontinue for stomach pains or diarrhea), Disp: 14 tablet, Rfl: 0   ezetimibe  (ZETIA ) 10 MG tablet, Take 1 tablet (10 mg total) by mouth daily., Disp: 90 tablet, Rfl: 3   famotidine -calcium  carbonate-magnesium  hydroxide (PEPCID  COMPLETE) 10-800-165 MG chewable tablet, Chew 1 tablet by mouth 2 (two) times daily as needed., Disp: 100 tablet, Rfl: 11   Ibuprofen-diphenhydrAMINE Cit (MOTRIN PM PO), Take by mouth., Disp: , Rfl:    indomethacin  (INDOCIN ) 50 MG capsule, Take 1 capsule (50 mg total) by mouth 3 (three) times daily as needed., Disp: 15 capsule, Rfl: 1   losartan  (COZAAR ) 50 MG tablet, Take 1 tablet (50 mg total) by mouth daily., Disp: 90 tablet, Rfl: 3   metoprolol  succinate (TOPROL  XL) 25 MG 24 hr tablet, Take 1 tablet (25 mg total) by mouth daily., Disp: 90 tablet, Rfl: 1   omeprazole  (PRILOSEC) 10 MG capsule, Take 1 capsule (10 mg total) by mouth daily., Disp: 100 capsule, Rfl: 3   sildenafil  (VIAGRA ) 100 MG tablet, Take 1 tablet (100 mg total) by mouth daily as needed for erectile dysfunction.,  Disp: 10 tablet, Rfl: 3   Turmeric (QC TUMERIC COMPLEX PO), Take by mouth daily., Disp: , Rfl:   No Known Allergies ROS neg/noncontributory except as noted HPI/below      Objective:      BP 116/69   Pulse (!) 48   Temp (!) 97.5 F (36.4 C) (Temporal)   Resp 18   Ht 5\' 4"  (1.626 m)   Wt 163 lb (73.9 kg)   SpO2 98%   BMI 27.98 kg/m  Wt Readings from Last 3 Encounters:  06/03/23 163 lb (73.9 kg)  04/14/23 166 lb 3.2 oz (75.4 kg)  01/03/23 168 lb 6 oz (76.4 kg)    Physical Exam   Gen: WDWN NAD HEENT: NCAT, conjunctiva not injected, sclera nonicteric OP normal NECK:  supple, no thyromegaly, no nodes, no carotid bruits CARDIAC: RRR, S1S2+, no murmur.  LUNGS: CTAB. No wheezes ABDOMEN:  BS+, soft, NTND, No HSM, no masses EXT:  no edema MSK: no gross abnormalities.  NEURO: A&O x3.  CN II-XII intact.  PSYCH: normal mood. Good eye contact  Reviewed pod appts and MRI report    Assessment & Plan:  Dysphagia, unspecified type  Gastroesophageal reflux disease without esophagitis  Metatarsalgia of both feet  Assessment and Plan Assessment & Plan Dysphagia   He has experienced dysphagia for the past two weeks, with difficulty swallowing solid foods like potatoes, bread, and apples. Symptoms have improved but persist. There are no signs of acid reflux, although he uses omeprazole  long-term. The differential diagnosis includes potential esophageal or allergy-related causes. Physical examination shows no abnormalities. He is considering waiting to see if symptoms resolve before pursuing further diagnostics. Increase omeprazole  dosage short term if symptoms worsen, especially during travel. Consider ENT or GI referral if symptoms persist or worsen after travel. Ensure omeprazole  availability during travel for symptom management.  Chronic foot pain   He has had chronic foot pain for two and a half years, primarily affecting the area forward of the ball of both feet. Previous  interventions, including cortisone injections and physical therapy, have not provided relief. Morton's neuroma has been ruled out. He is seeking further evaluation from a podiatrist. Seek recommendations for a podiatrist. Consider referral to a new podiatrist in town with extended hours if no recommendations are found.    Return for as sch for f/u June.  Ellsworth Haas, MD

## 2023-06-10 IMAGING — DX DG CHEST 1V PORT
1 series · 1 of 1 positions shown · non-contrast
Comparison: Previous studies including the examination of
05/09/2021.

CLINICAL DATA: Chest pain, shortness of breath recent coronary
bypass surgery

EXAM:
PORTABLE CHEST 1 VIEW

[chest]
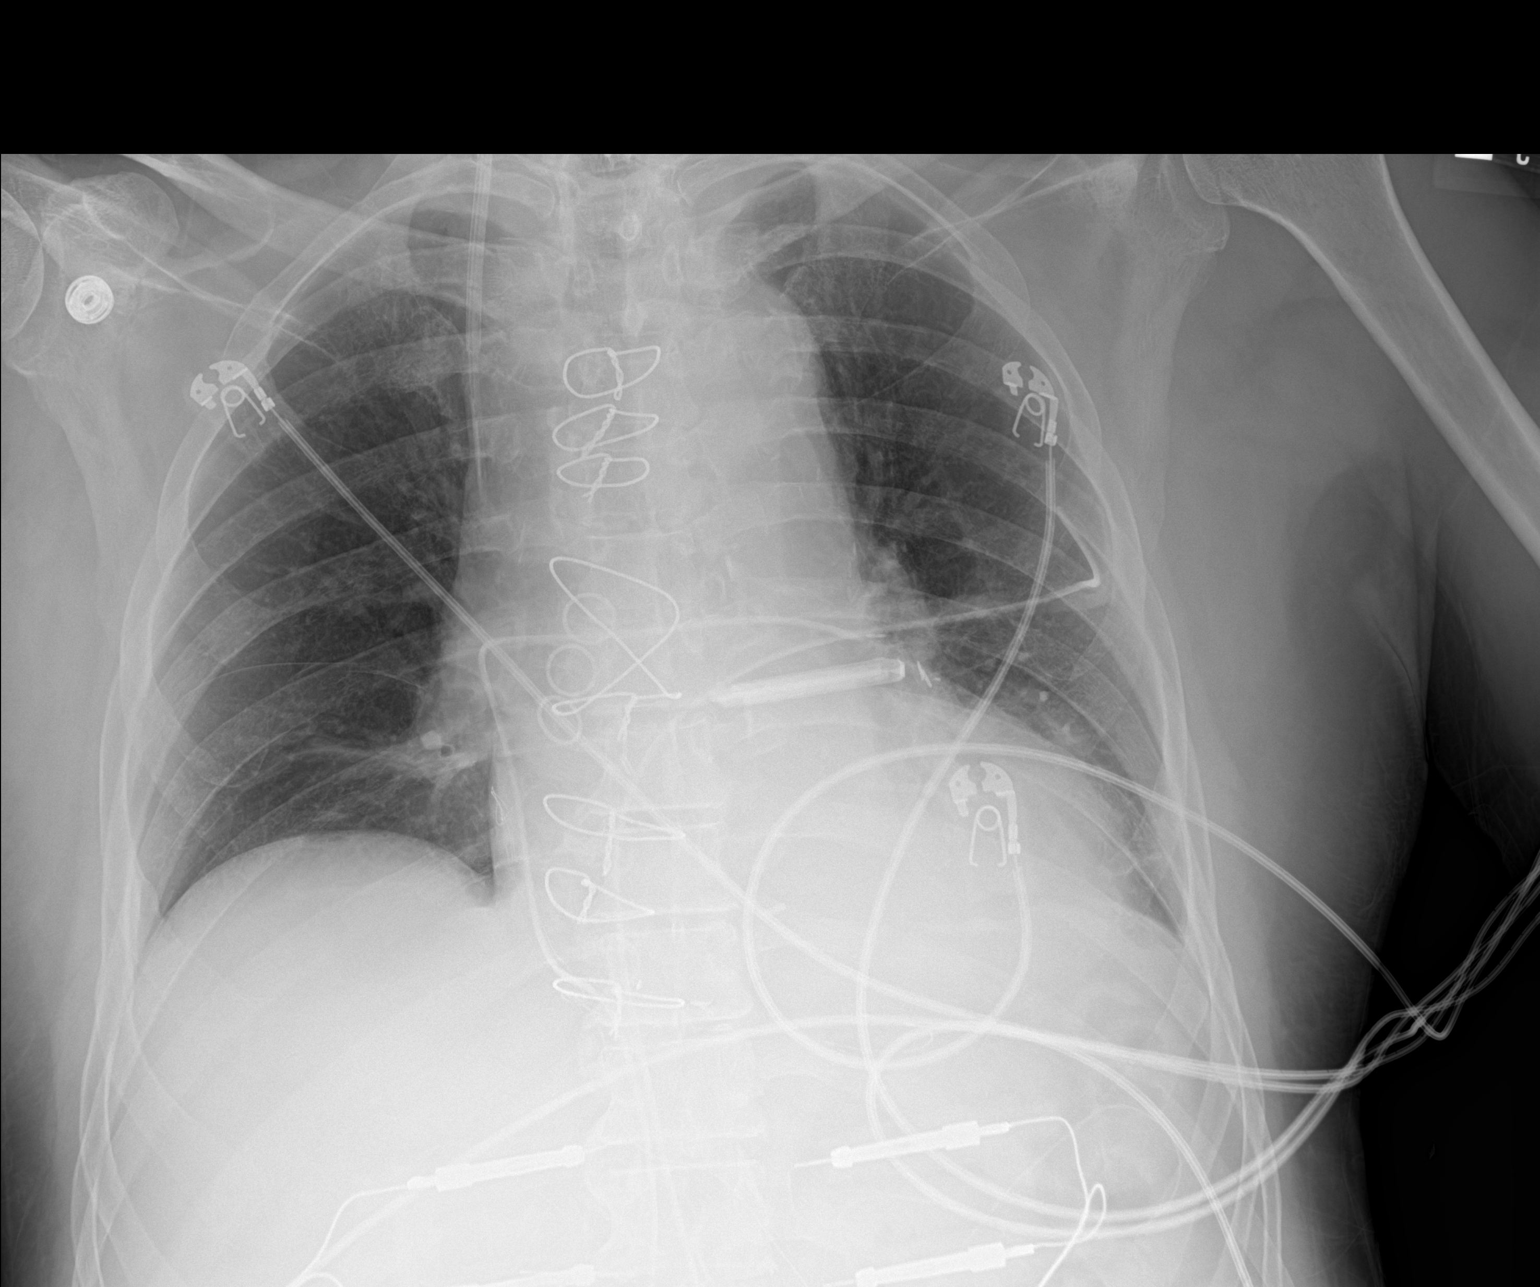

[1 of 1 positions shown; findings below may reference images not displayed]

FINDINGS: Transverse diameter of heart is increased. Increased density in the
medial left lower lung fields as not changed. Minimal subsegmental
atelectasis is seen in the right lower lung fields. There are no
signs of alveolar pulmonary edema. Costophrenic angles are clear.
There is no pneumothorax. Tip of right IJ central venous catheter is
seen in the superior vena cava. No change is noted in the left chest
tube and mediastinal drain. There is evidence of coronary bypass
surgery. There is a metallic clamp in the region of left atrial
appendage.
IMPRESSION: Increased density in medial left lower lung fields may suggest
atelectasis/pneumonia. Minimal subsegmental atelectasis is seen in
the right lower lung fields. There are no signs of pulmonary edema
or new focal infiltrates.

## 2023-06-16 ENCOUNTER — Other Ambulatory Visit: Payer: Self-pay | Admitting: Internal Medicine

## 2023-06-16 DIAGNOSIS — K219 Gastro-esophageal reflux disease without esophagitis: Secondary | ICD-10-CM

## 2023-07-09 ENCOUNTER — Encounter: Payer: Self-pay | Admitting: Family Medicine

## 2023-07-09 ENCOUNTER — Ambulatory Visit: Payer: Self-pay | Admitting: Family Medicine

## 2023-07-09 ENCOUNTER — Ambulatory Visit: Payer: Medicare Other | Admitting: Internal Medicine

## 2023-07-09 ENCOUNTER — Ambulatory Visit (INDEPENDENT_AMBULATORY_CARE_PROVIDER_SITE_OTHER): Admitting: Family Medicine

## 2023-07-09 VITALS — BP 122/68 | HR 46 | Temp 97.5°F | Resp 18 | Ht 64.0 in | Wt 166.1 lb

## 2023-07-09 DIAGNOSIS — E7800 Pure hypercholesterolemia, unspecified: Secondary | ICD-10-CM | POA: Diagnosis not present

## 2023-07-09 DIAGNOSIS — I10 Essential (primary) hypertension: Secondary | ICD-10-CM | POA: Diagnosis not present

## 2023-07-09 DIAGNOSIS — R7303 Prediabetes: Secondary | ICD-10-CM | POA: Diagnosis not present

## 2023-07-09 DIAGNOSIS — Z79899 Other long term (current) drug therapy: Secondary | ICD-10-CM | POA: Diagnosis not present

## 2023-07-09 DIAGNOSIS — N521 Erectile dysfunction due to diseases classified elsewhere: Secondary | ICD-10-CM | POA: Diagnosis not present

## 2023-07-09 LAB — COMPREHENSIVE METABOLIC PANEL WITH GFR
ALT: 25 U/L (ref 0–53)
AST: 26 U/L (ref 0–37)
Albumin: 4.9 g/dL (ref 3.5–5.2)
Alkaline Phosphatase: 74 U/L (ref 39–117)
BUN: 21 mg/dL (ref 6–23)
CO2: 29 meq/L (ref 19–32)
Calcium: 9.9 mg/dL (ref 8.4–10.5)
Chloride: 102 meq/L (ref 96–112)
Creatinine, Ser: 1.03 mg/dL (ref 0.40–1.50)
GFR: 73.13 mL/min (ref >60.00)
Glucose, Bld: 80 mg/dL (ref 70–99)
Potassium: 4.1 meq/L (ref 3.5–5.1)
Sodium: 141 meq/L (ref 135–145)
Total Bilirubin: 0.9 mg/dL (ref 0.2–1.2)
Total Protein: 7.6 g/dL (ref 6.0–8.3)

## 2023-07-09 LAB — CBC WITH DIFFERENTIAL/PLATELET
Basophils Absolute: 0.1 10*3/uL (ref 0.0–0.1)
Basophils Relative: 1.8 % (ref 0.0–3.0)
Eosinophils Absolute: 0.4 10*3/uL (ref 0.0–0.7)
Eosinophils Relative: 7.1 % — ABNORMAL HIGH (ref 0.0–5.0)
HCT: 44.4 % (ref 39.0–52.0)
Hemoglobin: 14.9 g/dL (ref 13.0–17.0)
Lymphocytes Relative: 36.2 % (ref 12.0–46.0)
Lymphs Abs: 2.2 10*3/uL (ref 0.7–4.0)
MCHC: 33.5 g/dL (ref 30.0–36.0)
MCV: 87.4 fl (ref 78.0–100.0)
Monocytes Absolute: 0.7 10*3/uL (ref 0.1–1.0)
Monocytes Relative: 11.2 % (ref 3.0–12.0)
Neutro Abs: 2.6 10*3/uL (ref 1.4–7.7)
Neutrophils Relative %: 43.7 % (ref 43.0–77.0)
Platelets: 145 10*3/uL — ABNORMAL LOW (ref 150.0–400.0)
RBC: 5.08 Mil/uL (ref 4.22–5.81)
RDW: 14.2 % (ref 11.5–15.5)
WBC: 6 10*3/uL (ref 4.0–10.5)

## 2023-07-09 LAB — VITAMIN B12: Vitamin B-12: 284 pg/mL (ref 211–911)

## 2023-07-09 LAB — HEMOGLOBIN A1C: Hgb A1c MFr Bld: 6.2 % (ref 4.6–6.5)

## 2023-07-09 LAB — TSH: TSH: 2.26 u[IU]/mL (ref 0.35–5.50)

## 2023-07-09 NOTE — Progress Notes (Signed)
 Subjective:     Patient ID: Nathaniel Eck., male    DOB: 10/01/1950, 73 y.o.   MRN: [id number]  Chief Complaint  Patient presents with   Medical Management of Chronic Issues    6 month follow-up on htn, cholesterol Not fasting Feet issue, has appointment with specialist    HPI Discussed the use of AI scribe software for clinical note transcription with the patient, who gave verbal consent to proceed.  History of Present Illness Nathaniel Eck. Nathaniel Bender is a 73 year old male who presents for a follow-up visit.  He is experiencing ongoing foot pain and has scheduled appointments with specialists for further evaluation. He previously received a steroid injection for this issue.  His hypertension is managed with amlodipine  10 mg and metoprolol  25 mg,and losartan  50mg  with stable blood pressure readings around 135/75 mmHg. He experiences trace swelling, likely due to amlodipine , and manages it with smart wool socks and compression stockings. No headaches, dizziness, chest pain, or shortness of breath.  For hyperlipidemia, he takes atorvastatin  40 mg and Zetia  10 mg, with previous cholesterol levels reported as good.  He recently experienced a arthritis flare in his big toe, described as 'electric' pain, which subsided with ice, Voltaren, and Aleve over the past two days. Uric acid levels have been normal in serum  He uses Viagra  for erectile dysfunction but finds it only moderately effective, with side effects such as facial flushing and dizziness.  He manages gastroesophageal reflux disease with omeprazole , reduced to 10 mg, and occasionally uses Pepcid  for breakthrough symptoms.  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   Atypical mole 05/04/2013   Right Wrist-Severe   CAD (coronary artery disease)    Chest pain 05/06/2021   Colon polyps    Hyperlipidemia    Hypertension    Kidney stone    Meniere's disease of left ear    s/p ENT evaluation; acute hearing loss; chronic tinnitus L now.  Recovered hearing completely.   Sudden idiopathic hearing loss 11/20/2011   Seen by [facility] 10/2011      Past Surgical History:  Procedure Laterality Date   CLIPPING OF ATRIAL APPENDAGE Left 05/08/2021   Procedure: CLIPPING OF ATRIAL APPENDAGE;  Surgeon: Hilarie Lovely, MD;  Location: [facility] OR;  Service: Open Heart Surgery;  Laterality: Left;   CORONARY ARTERY BYPASS GRAFT N/A 05/08/2021   Procedure: CORONARY ARTERY BYPASS GRAFTING x4 USING LEFT INTERNAL MAMMORY ARTERY AND BILATERAL GREATER SAPHENOUS VEIN;  Surgeon: Hilarie Lovely, MD;  Location: [facility] OR;  Service: Open Heart Surgery;  Laterality: N/A;  atriclip placement.  flow trac   ENDOVEIN HARVEST OF GREATER SAPHENOUS VEIN Bilateral 05/08/2021   Procedure: ENDOVEIN HARVEST OF GREATER SAPHENOUS VEIN;  Surgeon: Hilarie Lovely, MD;  Location: [facility] OR;  Service: Open Heart Surgery;  Laterality: Bilateral;   LEFT HEART CATH AND CORONARY ANGIOGRAPHY N/A 05/07/2021  Procedure: LEFT HEART CATH AND CORONARY ANGIOGRAPHY;  Surgeon: Sammy Crisp, MD;  Location: [facility] INVASIVE [facility];  Service: Cardiovascular;  Laterality: N/A;   ROTATOR CUFF REPAIR Right    TEE WITHOUT CARDIOVERSION  05/08/2021   Procedure: TRANSESOPHAGEAL ECHOCARDIOGRAM (TEE);  Surgeon: Hilarie Lovely, MD;  Location: [facility] OR;  Service: Open Heart Surgery;;   TONSILLECTOMY        Current Outpatient Medications:    amLODipine  (NORVASC ) 10 MG tablet, Take 1 tablet (10 mg total) by mouth daily., Disp: 90 tablet, Rfl: 3   aspirin  (ASPIRIN  81) 81 MG chewable tablet, Chew 81 mg by mouth daily., Disp: , Rfl:    atorvastatin  (LIPITOR ) 40 MG tablet, Take 1 tablet (40 mg total) by mouth daily., Disp: 90 tablet, Rfl: 3   clindamycin (CLEOCIN T) 1 % external solution, As needed., Disp: , Rfl:    colchicine  0.6 MG tablet, Take 2 tabs immediately, then 1 tab twice per day for the duration of the flare up to a max of 7 days (but discontinue for stomach pains or diarrhea), Disp: 14 tablet, Rfl: 0   ezetimibe  (ZETIA ) 10 MG tablet, Take 1 tablet (10 mg total) by mouth daily., Disp: 90 tablet, Rfl: 3   famotidine -calcium  carbonate-magnesium  hydroxide (PEPCID  COMPLETE) 10-800-165 MG chewable tablet, Chew 1 tablet by mouth 2 (two) times daily as needed., Disp: 100 tablet, Rfl: 11   Ibuprofen-diphenhydrAMINE Cit (MOTRIN PM PO), Take by mouth., Disp: , Rfl:    losartan  (COZAAR ) 50 MG tablet, Take 1 tablet (50 mg total) by mouth daily., Disp: 90 tablet, Rfl: 3   metoprolol  succinate (TOPROL  XL) 25 MG 24 hr tablet, Take 1 tablet (25 mg total) by mouth daily., Disp: 90 tablet, Rfl: 1   omeprazole  (PRILOSEC) 10 MG capsule, TAKE 1 CAPSULE BY MOUTH DAILY, Disp: 90 capsule, Rfl: 3   sildenafil  (VIAGRA ) 100 MG tablet, Take 1 tablet (100 mg total) by mouth daily as needed for erectile dysfunction., Disp: 10 tablet, Rfl: 3   Turmeric (QC TUMERIC COMPLEX PO), Take by mouth daily., Disp: , Rfl:   No Known Allergies ROS neg/noncontributory except as noted HPI/below      Objective:     BP 122/68 (BP Location: Left Arm, Patient Position: Sitting, Cuff Size: Normal)   Pulse (!) 46   Temp (!) 97.5 F (36.4 C) (Temporal)   Resp 18   Ht 5' 4 (1.626 m)   Wt 166 lb 2 oz (75.4 kg)   SpO2 97%   BMI 28.52 kg/m  Wt Readings from Last 3 Encounters:  07/09/23 166 lb 2 oz (75.4 kg)  06/03/23 163 lb  (73.9 kg)  04/14/23 166 lb 3.2 oz (75.4 kg)    Physical Exam   Gen: WDWN NAD HEENT: NCAT, conjunctiva not injected, sclera nonicteric NECK:  supple, no thyromegaly, no nodes, no carotid bruits CARDIAC: brady RRR, S1S2+, no murmur. DP 2+B LUNGS: CTAB. No wheezes ABDOMEN:  BS+, soft, NTND, No HSM, no masses EXT:  tr edema MSK: no gross abnormalities.  NEURO: A&O x3.  CN II-XII intact.  PSYCH: normal mood. Good eye contact     Assessment & Plan:  Primary hypertension -     CBC with Differential/Platelet -     Comprehensive metabolic panel with GFR -     TSH  Pure hypercholesterolemia -     TSH  Prediabetes -     Hemoglobin A1c -     TSH  Erectile dysfunction due to diseases classified elsewhere  High risk medication use -     Comprehensive metabolic panel with GFR -     Vitamin B12  Assessment and Plan Assessment & Plan Foot Pain   He experiences ongoing foot pain and is scheduled for evaluations with Zachary Hermanns and Dr. Cherl Corner. He previously received a steroid injection from Dr. Cherl Corner but prefers management by Zachary Hermanns. He will attend appointments with both providers.  Gout   He recently had a flare-up of pain in the big toe, though blood tests showed no gout markers. Pain is managed with ice, Voltaren, and Aleve. Indomethacin  was considered but not used due to potential gastrointestinal side effects. He will use Aleve for pain management as needed.  Hypertension   His blood pressure is well-controlled with amlodipine  10 mg, metoprolol  25 mg, and losartan  50 mg, with readings around 135/75 mmHg. He experiences trace swelling, likely due to amlodipine  and summer heat. Medication adjustment will be considered if swelling worsens, including reducing amlodipine  and increasing losartan . The target blood pressure is less than 130/80 mmHg. He will monitor blood pressure monthly.  Prediabetes   He manages prediabetes with diet and exercise but has not had recent  blood tests to assess current status. Blood tests will be ordered to monitor prediabetes.  Gastroesophageal Reflux Disease (GERD)   He manages GERD with omeprazole , reduced to 10 mg, and occasional antacids. Long-term acid suppressor use may affect B12 absorption. He will continue the current omeprazole  regimen, use antacids as needed, and a blood test will be ordered to check B12 levels.  Erectile Dysfunction   Viagra  (sildenafil ) is moderately effective but causes facial flushing and dizziness. Alternative options, including tadalafil  (Cialis ) and potential urology referral for other treatments, were discussed. Tadalafil  may be taken up to 20 mg. He will try tadalafil  and monitor effectiveness, considering a urology referral if ineffective.  General Health Maintenance   He is active and engages in regular physical activity.  Follow-up   He is scheduled for a follow-up in December unless issues arise before then.    Return in about 6 months (around 01/08/2024) for annual physical, chronic follow-up.  Ellsworth Haas, MD

## 2023-07-09 NOTE — Progress Notes (Signed)
 Labs stable-keep working on sugars B12 is a little low-take 1000mcg daily otc

## 2023-07-09 NOTE — Patient Instructions (Signed)
 It was very nice to see you today!  Tadalifil-max 20mg .     PLEASE NOTE:  If you had any lab tests please let us  know if you have not heard back within a few days. You may see your results on MyChart before we have a chance to review them but we will give you a call once they are reviewed by us . If we ordered any referrals today, please let us  know if you have not heard from their office within the next week.   Please try these tips to maintain a healthy lifestyle:  Eat most of your calories during the day when you are active. Eliminate processed foods including packaged sweets (pies, cakes, cookies), reduce intake of potatoes, white bread, white pasta, and white rice. Look for whole grain options, oat flour or almond flour.  Each meal should contain half fruits/vegetables, one quarter protein, and one quarter carbs (no bigger than a computer mouse).  Cut down on sweet beverages. This includes juice, soda, and sweet tea. Also watch fruit intake, though this is a healthier sweet option, it still contains natural sugar! Limit to 3 servings daily.  Drink at least 1 glass of water with each meal and aim for at least 8 glasses per day  Exercise at least 150 minutes every week.

## 2023-07-10 ENCOUNTER — Ambulatory Visit: Admitting: Sports Medicine

## 2023-07-10 VITALS — BP 128/63 | Ht 66.0 in | Wt 165.0 lb

## 2023-07-10 DIAGNOSIS — Q6672 Congenital pes cavus, left foot: Secondary | ICD-10-CM

## 2023-07-10 DIAGNOSIS — Q6671 Congenital pes cavus, right foot: Secondary | ICD-10-CM | POA: Diagnosis not present

## 2023-07-10 NOTE — Assessment & Plan Note (Signed)
 Patient was fitted for a : standard, cushioned, semi-rigid orthotic. The orthotic was heated and afterward the patient stood on the orthotic blank positioned on the orthotic stand. The patient was positioned in subtalar neutral position and 10 degrees of ankle dorsiflexion in a weight bearing stance. After completion of molding, a stable base was applied to the orthotic blank. The blank was ground to a stable position for weight bearing. Size: 11 EVA blank with forefoot rubber cushion to the entire forefoot Base: incorporated Posting: large Mt pads bilaterally Additional orthotic padding: none  At completion of the orthotic fitting the patient walked into neutral position.  He had some relief from the pressure and was able to tolerate the metatarsal pads well.  He will try these for 1 month and see if it improves his ability to walk and stand with less pain. If this is helpful we should try the same strategy and other pairs of shoes and try to protect his fourth foot from the type of pressure that he seems to be getting based on the shape of his cavus foot.

## 2023-07-10 NOTE — Progress Notes (Signed)
 Chief complaint bilateral forefoot pain that has persisted for 3 years  The patient has been seen by general orthopedics orthopedic foot specialists and by podiatry for forefoot pain.  This starts in the fat pad distal to the metatarsal heads bilaterally.  It is tender to touch.  It bothers him when he has to stand and spend long hours on his feet for his work.  It limits him out of walking he wants to do.  His evaluations have included x-rays and MRI without clear abnormalities. He has done physical therapy for his feet which makes it feel better but only briefly. He uses metatarsal cookies on both sides and these do help so he has put them in all of his shoes. He was prescribed an orthotic at 1 time which did not give him relief and was a more firm type of orthotic  He is most comfortable probably in [place] hiking shoes but has tried multiple shoes with only partial relief of his symptoms.  Physical exam Pleasant white male in no acute distress BP 128/63   Ht 5' 6 (1.676 m)   Wt 165 lb (74.8 kg)   BMI 26.63 kg/m   Examination of the feet Cavus foot shape bilaterally Pain to palpation and pressure just distal to the metatarsal heads and the adventitial fat pad No tenderness to palpation over the diaphysis of the metatarsals No tenderness to squeeze test or on pressure between the 3rd, 4th and 5th metatarsal heads to suggest Morton's neuroma Normal first toe motion Widening of the forefoot distally on the right with more flattening of the transverse arch Some Piezogenic papules on the heels bilaterally

## 2023-07-20 IMAGING — CR DG CHEST 2V
2 series · 2 of 2 positions shown · non-contrast
Comparison: 05/10/2021

CLINICAL DATA: History of CABG

EXAM:
CHEST - 2 VIEW

[w chest pa]
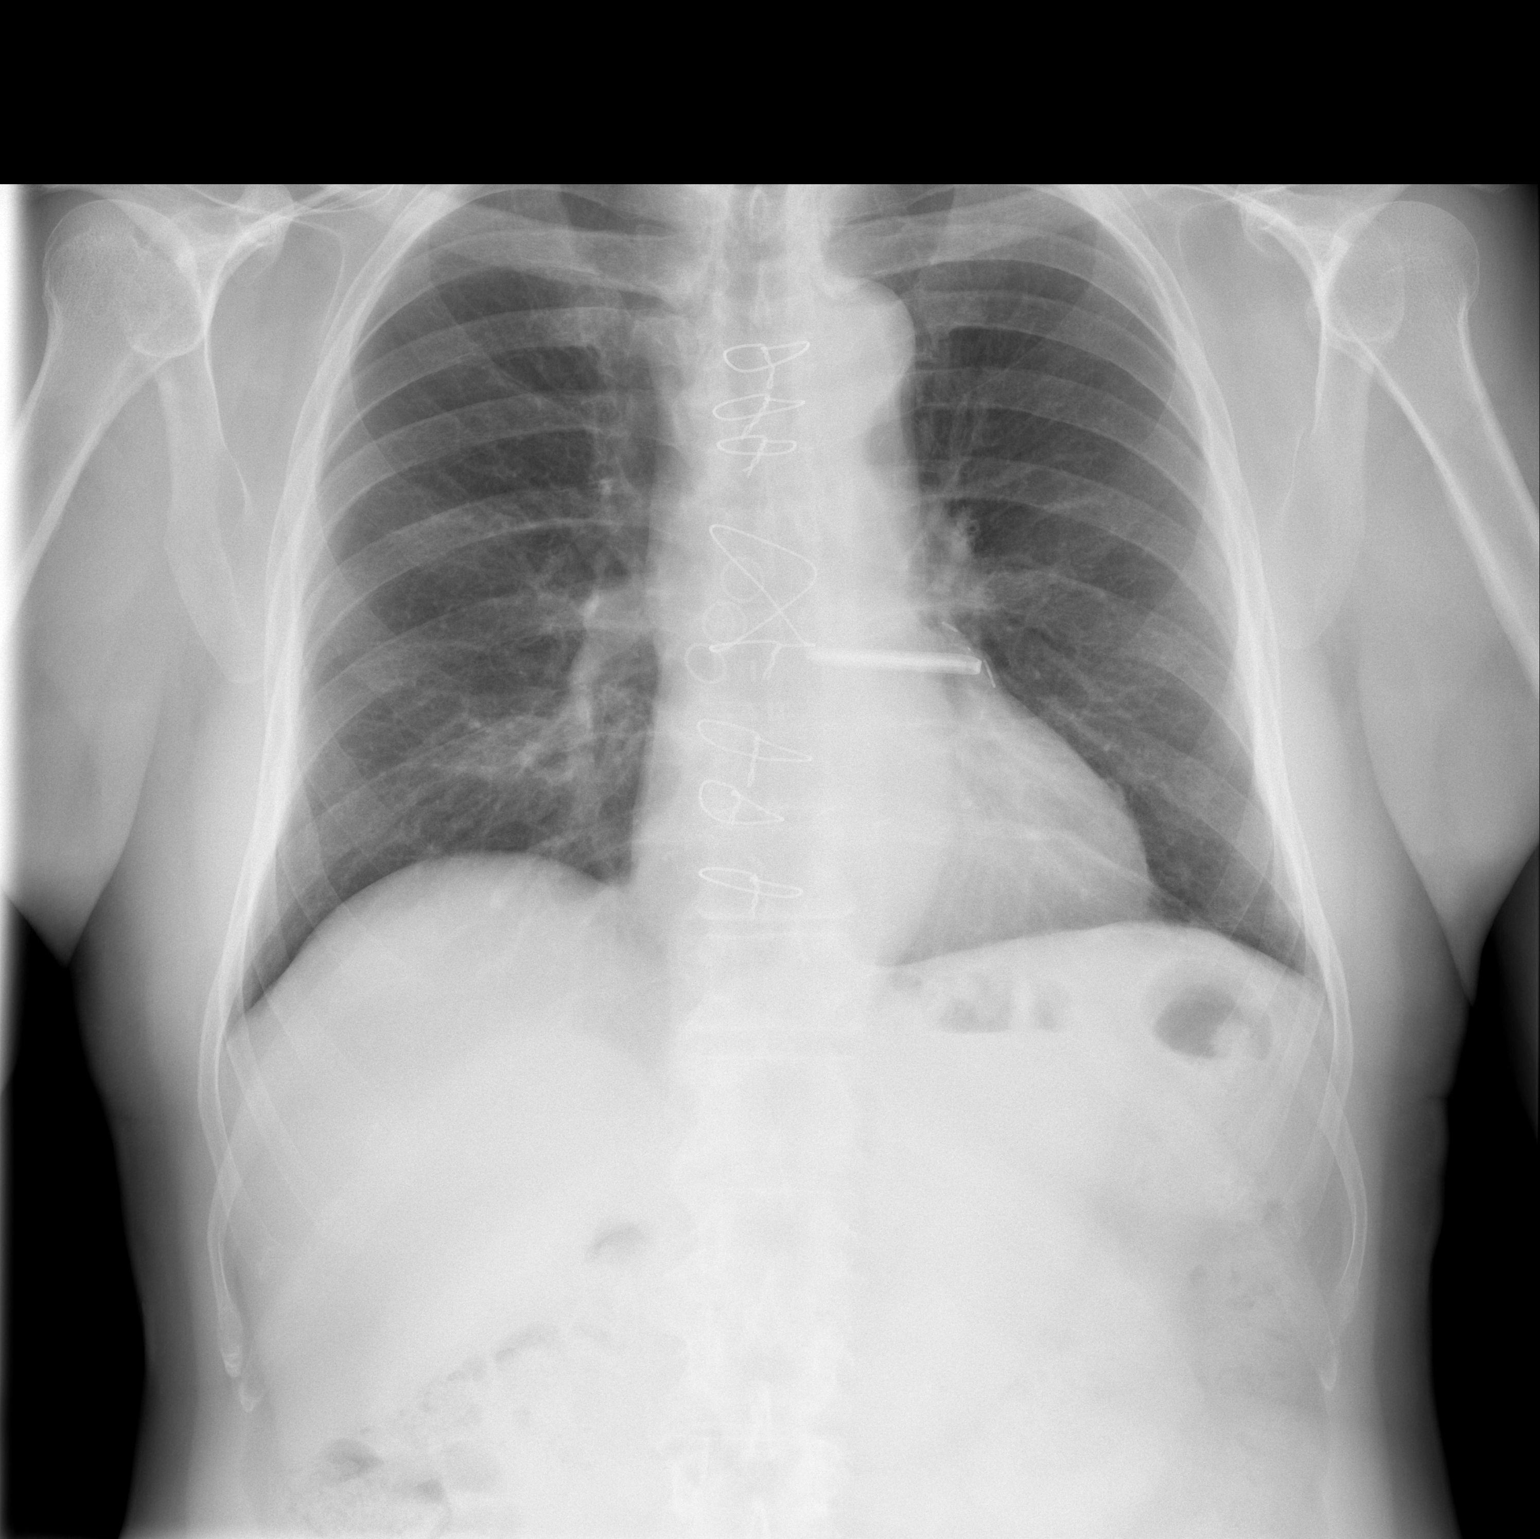

[w chest lat]
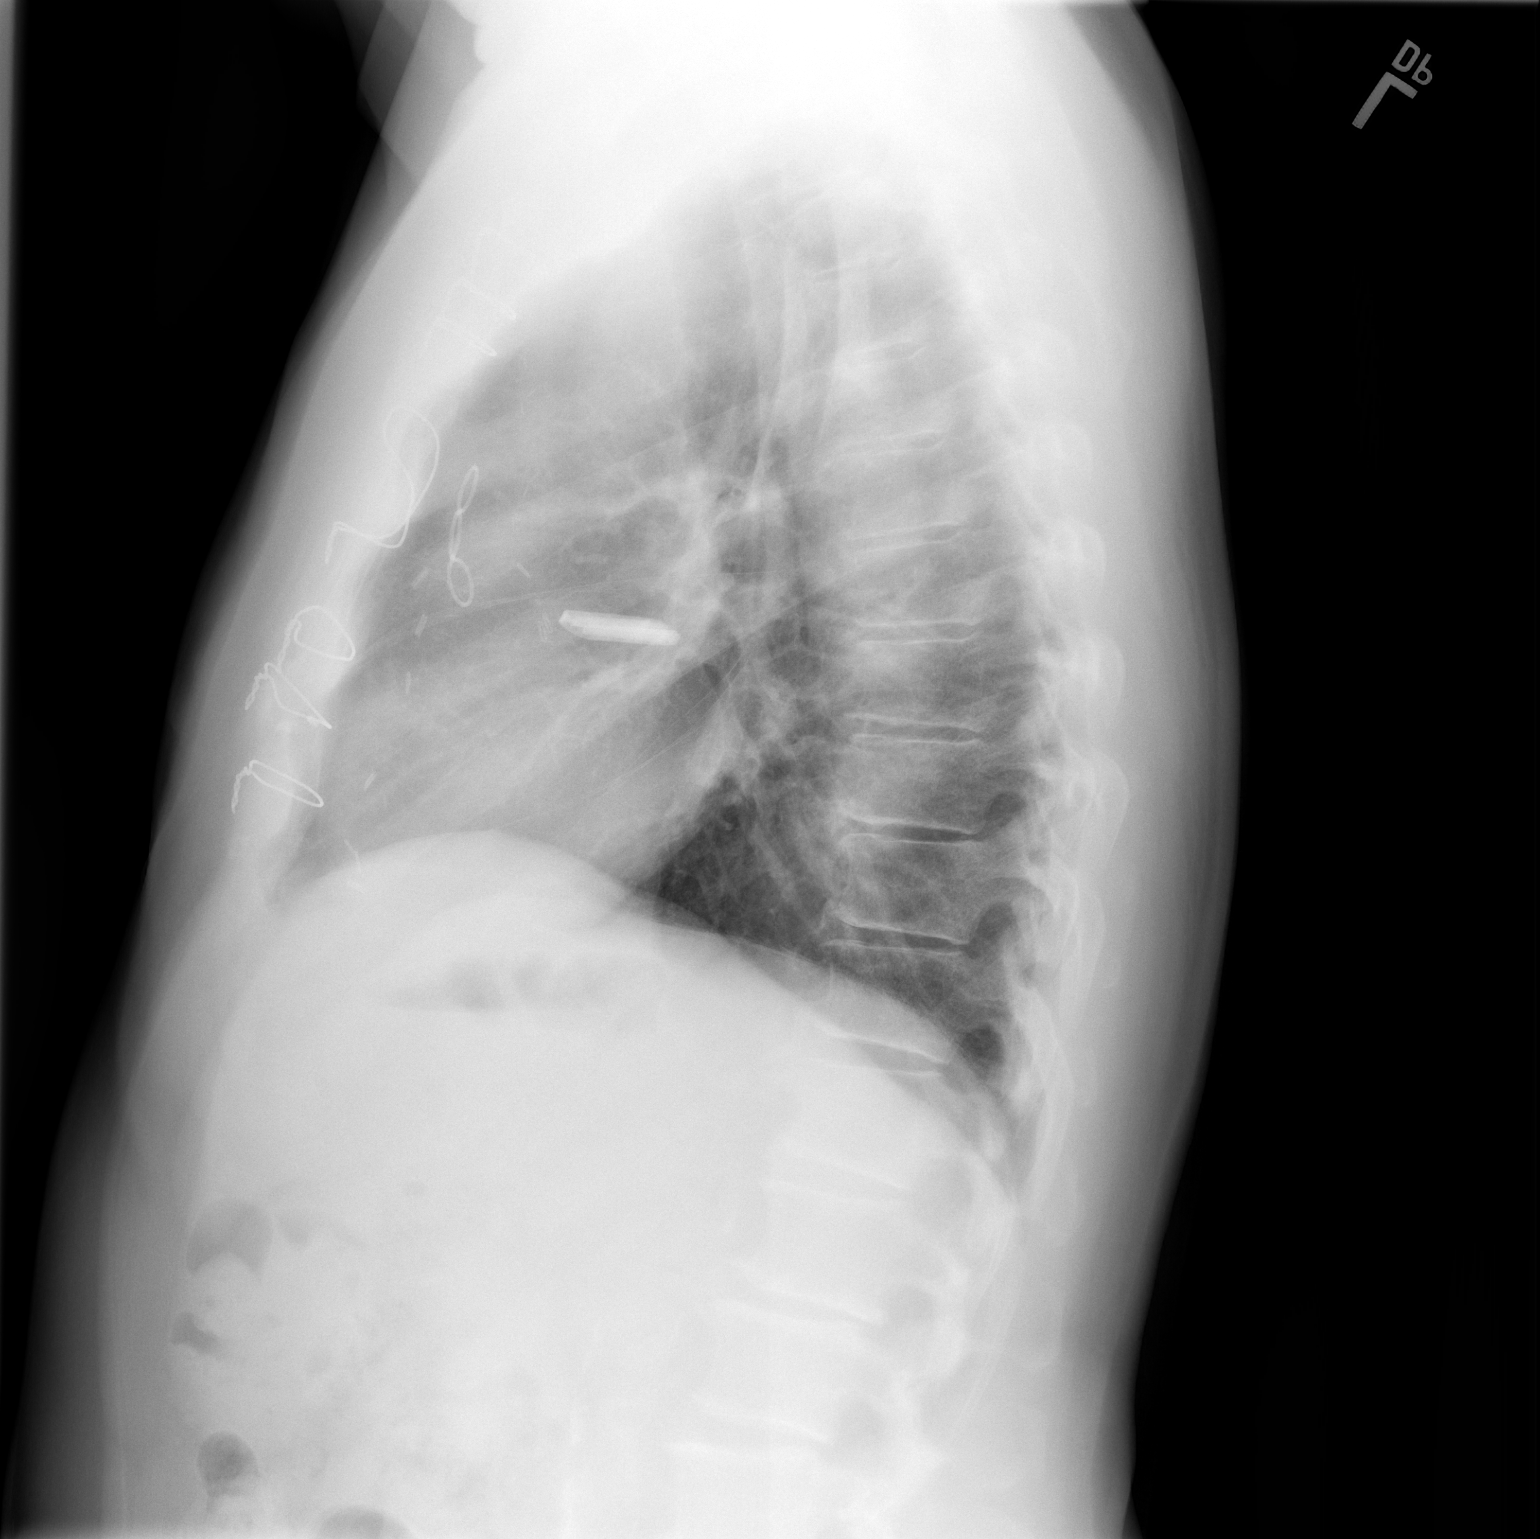

[2 of 2 positions shown; findings below may reference images not displayed]

FINDINGS: Since the previous exam, there has been interval removal of left
chest tube, mediastinal drain, and right IJ central venous catheter.
Postsurgical changes from median sternotomy with CABG and left
atrial appendage clipping. Heart size is normal. Lungs are clear. No
focal airspace consolidation, pleural effusion, or pneumothorax.
IMPRESSION: Interval removal of lines and tubes. No acute cardiopulmonary
process.

## 2023-08-07 ENCOUNTER — Ambulatory Visit: Admitting: Sports Medicine

## 2023-08-07 VITALS — BP 132/82 | Ht 66.0 in | Wt 165.0 lb

## 2023-08-07 DIAGNOSIS — M7741 Metatarsalgia, right foot: Secondary | ICD-10-CM

## 2023-08-07 DIAGNOSIS — Q6671 Congenital pes cavus, right foot: Secondary | ICD-10-CM

## 2023-08-07 DIAGNOSIS — Q6672 Congenital pes cavus, left foot: Secondary | ICD-10-CM

## 2023-08-07 DIAGNOSIS — M7742 Metatarsalgia, left foot: Secondary | ICD-10-CM

## 2023-08-07 NOTE — Progress Notes (Signed)
 PCP: Wendolyn Jenkins Jansky, MD  Subjective:   HPI: Patient is a 73 y.o. male here for follow-up of congenital cavus deformity of both feet.  He was last evaluated 6/19 by Dr. Harvey at which time he endorsed bilateralforefoot pain x 3 years as a result of a hiking injury.  He specifically endorsed fat pad pain distal to the metatarsal heads bilaterally with tenderness to palpation on exam.  He was fitted for custom orthotics and returns today for reassessment.  Today he says that his pain has significantly improved since starting orthotics.  He says that swelling has improved.  He does not experience pain in his feet when getting out of bed in the mornings and was even able to walk around his home without shoes earlier today.  Past Medical History:  Diagnosis Date   Acute respiratory failure with hypoxia (HCC)    Arthritis    Degenerative Disc Disease Cervical; s/p injection [facility].   Atypical mole 05/04/2013   Right Wrist-Severe   CAD (coronary artery disease)    Chest pain 05/06/2021   Colon polyps    Hyperlipidemia    Hypertension    Kidney stone    Meniere's disease of left ear    s/p ENT evaluation; acute hearing loss; chronic tinnitus L now.  Recovered hearing completely.   Sudden idiopathic hearing loss 11/20/2011   Seen by [facility] 10/2011      Current Outpatient Medications on File Prior to Visit  Medication Sig Dispense Refill   amLODipine  (NORVASC ) 10 MG tablet Take 1 tablet (10 mg total) by mouth daily. 90 tablet 3   aspirin  (ASPIRIN  81) 81 MG chewable tablet Chew 81 mg by mouth daily.     atorvastatin  (LIPITOR ) 40 MG tablet Take 1 tablet (40 mg total) by mouth daily. 90 tablet 3   clindamycin (CLEOCIN T) 1 % external solution As needed.     colchicine  0.6 MG tablet Take 2 tabs immediately, then 1 tab twice per day for the duration of the flare up to a max of 7 days (but discontinue for stomach pains or diarrhea) 14 tablet 0   ezetimibe  (ZETIA ) 10 MG tablet  Take 1 tablet (10 mg total) by mouth daily. 90 tablet 3   famotidine -calcium  carbonate-magnesium  hydroxide (PEPCID  COMPLETE) 10-800-165 MG chewable tablet Chew 1 tablet by mouth 2 (two) times daily as needed. 100 tablet 11   Ibuprofen-diphenhydrAMINE Cit (MOTRIN PM PO) Take by mouth.     losartan  (COZAAR ) 50 MG tablet Take 1 tablet (50 mg total) by mouth daily. 90 tablet 3   metoprolol  succinate (TOPROL  XL) 25 MG 24 hr tablet Take 1 tablet (25 mg total) by mouth daily. 90 tablet 1   omeprazole  (PRILOSEC) 10 MG capsule TAKE 1 CAPSULE BY MOUTH DAILY 90 capsule 3   sildenafil  (VIAGRA ) 100 MG tablet Take 1 tablet (100 mg total) by mouth daily as needed for erectile dysfunction. 10 tablet 3   Turmeric (QC TUMERIC COMPLEX PO) Take by mouth daily.     No current facility-administered medications on file prior to visit.    Past Surgical History:  Procedure Laterality Date   CLIPPING OF ATRIAL APPENDAGE Left 05/08/2021   Procedure: CLIPPING OF ATRIAL APPENDAGE;  Surgeon: Shyrl Linnie KIDD, MD;  Location: [facility] OR;  Service: Open Heart Surgery;  Laterality: Left;   CORONARY ARTERY BYPASS GRAFT N/A 05/08/2021   Procedure: CORONARY ARTERY BYPASS GRAFTING x4 USING LEFT INTERNAL MAMMORY ARTERY AND BILATERAL GREATER SAPHENOUS VEIN;  Surgeon: Shyrl,  Linnie KIDD, MD;  Location: [facility] OR;  Service: Open Heart Surgery;  Laterality: N/A;  atriclip placement.  flow trac   ENDOVEIN HARVEST OF GREATER SAPHENOUS VEIN Bilateral 05/08/2021   Procedure: ENDOVEIN HARVEST OF GREATER SAPHENOUS VEIN;  Surgeon: Shyrl Linnie KIDD, MD;  Location: [facility] OR;  Service: Open Heart Surgery;  Laterality: Bilateral;   LEFT HEART CATH AND CORONARY ANGIOGRAPHY N/A 05/07/2021   Procedure: LEFT HEART CATH AND CORONARY ANGIOGRAPHY;  Surgeon: Mady Bruckner, MD;  Location: [facility] INVASIVE [facility] LAB;  Service: Cardiovascular;  Laterality: N/A;   ROTATOR CUFF REPAIR Right    TEE WITHOUT CARDIOVERSION  05/08/2021   Procedure: TRANSESOPHAGEAL  ECHOCARDIOGRAM (TEE);  Surgeon: Shyrl Linnie KIDD, MD;  Location: [facility] OR;  Service: Open Heart Surgery;;   TONSILLECTOMY      No Known Allergies  BP 132/82   Ht 5' 6 (1.676 m)   Wt 165 lb (74.8 kg)   BMI 26.63 kg/m       No data to display              No data to display              Objective:  Physical Exam:  Gen: NAD, comfortable in exam room  Bilateral feet Cavus foot shape bilaterally There is no tenderness palpation distal to the metatarsal heads and adventitial fat pad   Assessment & Plan:  1. Forefoot Pain - chronic 2. Cavus foot   I observed and examined the patient with the [facility] resident and agree with assessment and plan.  Note reviewed and modified by me. Orthotic preparation listed under problem: Cavus foot.  PATRICE Haddock, MD

## 2023-08-07 NOTE — Assessment & Plan Note (Signed)
 After a good response to the custom orthotics with supersole forefoot padding we went ahead today to make him a second pair to use.  Patient was fitted for a : standard, cushioned, semi-rigid orthotic. The orthotic was heated and afterward the patient stood on the orthotic blank positioned on the orthotic stand. The patient was positioned in subtalar neutral position and 10 degrees of ankle dorsiflexion in a weight bearing stance. After completion of molding, a stable base was applied to the orthotic blank. The blank was ground to a stable position for weight bearing. Size: 11 Base: supercell forefoot padded EVA blank Posting: incorporated Additional orthotic padding: Large MT pads bilaterally

## 2023-08-20 ENCOUNTER — Other Ambulatory Visit (HOSPITAL_BASED_OUTPATIENT_CLINIC_OR_DEPARTMENT_OTHER): Payer: Self-pay | Admitting: Family

## 2023-08-20 DIAGNOSIS — I25118 Atherosclerotic heart disease of native coronary artery with other forms of angina pectoris: Secondary | ICD-10-CM

## 2023-08-28 ENCOUNTER — Encounter: Payer: Self-pay | Admitting: Dermatology

## 2023-08-28 ENCOUNTER — Ambulatory Visit: Payer: Medicare Other | Admitting: Dermatology

## 2023-08-28 VITALS — BP 146/81

## 2023-08-28 DIAGNOSIS — Z1283 Encounter for screening for malignant neoplasm of skin: Secondary | ICD-10-CM | POA: Diagnosis not present

## 2023-08-28 DIAGNOSIS — Z85828 Personal history of other malignant neoplasm of skin: Secondary | ICD-10-CM

## 2023-08-28 DIAGNOSIS — L570 Actinic keratosis: Secondary | ICD-10-CM | POA: Diagnosis not present

## 2023-08-28 DIAGNOSIS — L720 Epidermal cyst: Secondary | ICD-10-CM

## 2023-08-28 DIAGNOSIS — D229 Melanocytic nevi, unspecified: Secondary | ICD-10-CM

## 2023-08-28 DIAGNOSIS — L739 Follicular disorder, unspecified: Secondary | ICD-10-CM

## 2023-08-28 DIAGNOSIS — Z808 Family history of malignant neoplasm of other organs or systems: Secondary | ICD-10-CM

## 2023-08-28 DIAGNOSIS — L578 Other skin changes due to chronic exposure to nonionizing radiation: Secondary | ICD-10-CM

## 2023-08-28 DIAGNOSIS — L814 Other melanin hyperpigmentation: Secondary | ICD-10-CM

## 2023-08-28 DIAGNOSIS — D1801 Hemangioma of skin and subcutaneous tissue: Secondary | ICD-10-CM

## 2023-08-28 DIAGNOSIS — W908XXA Exposure to other nonionizing radiation, initial encounter: Secondary | ICD-10-CM | POA: Diagnosis not present

## 2023-08-28 DIAGNOSIS — L821 Other seborrheic keratosis: Secondary | ICD-10-CM

## 2023-08-28 MED ORDER — CLINDAMYCIN PHOSPHATE 1 % EX SOLN: CUTANEOUS | 5 refills | Status: AC | PRN

## 2023-08-28 MED ORDER — FLUOROURACIL 5 % EX CREA: TOPICAL_CREAM | Freq: Two times a day (BID) | CUTANEOUS | 0 refills | Status: DC

## 2023-08-28 NOTE — Progress Notes (Signed)
 Total Body Skin Exam (TBSE) Visit   Subjective  Nathaniel Bender. is a 73 y.o. male ESTABLISHED PATIENT who presents for the following:  Total Body Skin Exam (TBSE)  Patient was last evaluated for TBSE on 02/20/23 .  Patient does have spots of concern to be evaluated. he  does apply sunscreen and/or wears protective coverings. Reports Hx of Bx proven BCC on L ear Tx with MOHS 2024. Reports family Hx of skin cancers (mother - BCC).   The patient has spots, moles and lesions to be evaluated, some may be new or changing and the patient has concerns that these could be cancer.  The following portions of the chart were reviewed this encounter and updated as appropriate: medications, allergies, medical history  Review of Systems:  No other skin or systemic complaints except as noted in HPI or Assessment and Plan.  Objective  Well appearing patient in no apparent distress; mood and affect are within normal limits.  A full examination was performed including scalp, head, eyes, ears, nose, lips, neck, chest, axillae, abdomen, back, buttocks, bilateral upper extremities, bilateral lower extremities, hands, feet, fingers, toes, fingernails, and toenails. All findings within normal limits unless otherwise noted below.   Relevant physical exam findings are noted in the Assessment and Plan.  L ear, L temple, L cheek, forehead, R cheek, L arm, R arm, back (15) Erythematous thin papules/macules with gritty scale.   Assessment & Plan   LENTIGINES, SEBORRHEIC KERATOSES, HEMANGIOMAS - Benign normal skin lesions - Benign-appearing - Call for any changes  MELANOCYTIC NEVI - Tan-brown and/or pink-flesh-colored symmetric macules and papules - Benign appearing on exam today - Observation - Call clinic for new or changing moles - Recommend daily use of broad spectrum spf 30+ sunscreen to sun-exposed areas.   ACTINIC DAMAGE - Chronic condition, secondary to cumulative UV/sun exposure - diffuse scaly  erythematous macules with underlying dyspigmentation - Recommend daily broad spectrum sunscreen SPF 30+ to sun-exposed areas, reapply every 2 hours as needed.  - Staying in the shade or wearing long sleeves, sun glasses (UVA+UVB protection) and wide brim hats (4-inch brim around the entire circumference of the hat) are also recommended for sun protection.  - Call for new or changing lesions. - Rx sent for Efudex . Use BID for 2 weeks then stop.   FOLLICULITIS Exam: Controlled, Perifollicular erythematous papules and pustules  Treatment Plan: - Controlled with clindamycin  1% lotion. Will send refills today.  EPIDERMAL INCLUSION CYST Exam: Subcutaneous nodule at L shoulder  Benign-appearing. Exam most consistent with an epidermal inclusion cyst. Discussed that a cyst is a benign growth that can grow over time and sometimes get irritated or inflamed. Recommend observation if it is not bothersome. Discussed option of surgical excision to remove it if it is growing, symptomatic, or other changes noted. Please call for new or changing lesions so they can be evaluated.    SKIN CANCER SCREENING PERFORMED TODAY.   FOLLICULITIS   Related Medications clindamycin  (CLEOCIN  T) 1 % external solution Apply topically as needed. AK (ACTINIC KERATOSIS) (15) L ear, L temple, L cheek, forehead, R cheek, L arm, R arm, back (15) Destruction of lesion - L ear, L temple, L cheek, forehead, R cheek, L arm, R arm, back (15) Complexity: simple   Destruction method: cryotherapy   Informed consent: discussed and consent obtained   Timeout:  patient name, date of birth, surgical site, and procedure verified Lesion destroyed using liquid nitrogen: Yes   Region frozen until ice  ball extended beyond lesion: Yes   Outcome: patient tolerated procedure well with no complications   Post-procedure details: wound care instructions given    Return in about 1 year (around 08/27/2024) for TBSE.   Documentation: I have  reviewed the above documentation for accuracy and completeness, and I agree with the above.  I, Shirron Maranda, CMA, am acting as scribe for [employer], DO.   Delon Lenis, DO

## 2023-08-28 NOTE — Patient Instructions (Signed)

## 2023-09-01 ENCOUNTER — Encounter (HOSPITAL_BASED_OUTPATIENT_CLINIC_OR_DEPARTMENT_OTHER): Payer: Self-pay

## 2023-09-01 NOTE — Telephone Encounter (Signed)
 OV spot on 09/10/23 held for patient - if he is agreeable will need to schedule.   Malin Cervini S Marko Skalski, NP

## 2023-09-09 NOTE — Progress Notes (Signed)
 Cardiology Office Note:  .   Date:  09/10/2023  ID:  Nathaniel Bender., DOB 07-11-1950, MRN [id number] PCP: Wendolyn Jenkins Jansky, MD  [facility] Providers Cardiologist:  Shelda Bruckner, MD {  History of Present Illness: .   Nathaniel Bender. is a 73 y.o. male with a hx of CAD s/p CABG, HTN, HLD, glucose intolerance, single episode of afib at time of NSTEMI who is here for follow up.   Pertinent CV history: He presented to the ED on 05/06/2021 for exertional chest pain and shortness of breath with a near syncopal episode lasting 15 minutes before resolution. The ED workup revealed Atrial fibrillation with RVR. His initial Troponin level was 31. He was admitted to the hospital. Ruled in for NSTEMI. CABG was discussed and agreed upon. Pre-operative carotid duplex US  showed no significant internal carotid artery stenosis bilaterally. On 05/08/21 he underwent CABG x 4. Lopressor  was increased for PVCs on 4/20. He had increasing blood pressure and was gradually resumed on his home medications, including Cozaar  and Norvasc . He was discharged in stable condition and referred to cardiac rehabilitation.   Today: Last seen by Reche Finder 07/2022. He sent a message 09/01/23 noting elevated blood pressure readings for about a week. Blood pressure today is at goal. Unclear trigger; he had eaten out the day before, but elevated numbers lasted for several days. Was otherwise feeling well. No other symptoms.  At home has been back in the 120s systolic.  Not sleeping well as dog is ill and is up at night.   No palpitations. Still with mild dyspnea on exertion with hills.  Rides motorcycles. 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   ROS: Denies chest pain, shortness of breath at rest or with normal exertion. No PND, orthopnea, LE edema or unexpected weight gain. No syncope or palpitations. ROS otherwise negative except as noted.   Studies Reviewed: SABRA     EKG:  EKG Interpretation Date/Time:  Wednesday September 10 2023 14:14:06 EDT Ventricular Rate:  42 PR Interval:  178 QRS Duration:  98 QT Interval:  438 QTC Calculation: 365 R Axis:   -58  Text Interpretation: Marked sinus bradycardia Left axis deviation Possible Anterior infarct , age undetermined Confirmed by Bruckner Shelda [phone]) on 09/10/2023 2:21:01 PM    Physical Exam:   VS:  BP 124/64 (BP Location: Left Arm, Patient Position: Sitting, Cuff Size: Normal)   Pulse (!) 42   Ht 5' 6 (1.676 m)   Wt 167 lb (75.8 kg)   SpO2 97%   BMI 26.95 kg/m    Wt Readings from Last 3 Encounters:  09/10/23 167 lb (75.8 kg)  08/07/23 165 lb (74.8 kg)  07/10/23 165 lb (74.8 kg)    GEN: Well nourished, well developed in no acute distress HEENT: Normal, moist mucous membranes NECK: No JVD CARDIAC: regular rhythm, normal S1 and S2, no rubs or gallops. No murmur. VASCULAR: Radial and DP pulses 2+ bilaterally. No carotid bruits RESPIRATORY:  Clear to auscultation without rales, wheezing or rhonchi  ABDOMEN: Soft, non-tender, non-distended MUSCULOSKELETAL:  Ambulates independently SKIN: Warm and dry, no edema NEUROLOGIC:  Alert and oriented x 3. No focal neuro deficits noted. PSYCHIATRIC:  Normal affect    ASSESSMENT AND PLAN: .    Hypertension: -continue amlodipine  10 mg, losartan  50 mg daily -at goal today  CAD s/p CABG 04/2021 Hypercholesterolemia -CABG X 4 (LIMA-LAD, reverse SVG-RI, OM3, PDA and 45mm  atrial clip placement) 04/2021 -continue aspirin , atorvastatin , ezetimibe  -on metoprolol  -LDL 58 recently.    Dyspnea on exertion -able to be very active, notices symptoms predominantly with hills, does not stop him -he will contact me if exercise tolerance or symptoms change   PVCs Atrial fibrillation, single event Sinus bradycardia -afib at time of NSTEMI, not noted on prior monitor, no symptoms/events since. No indication for anticoagulation unless afib recurs -on  metoprolol  for PVCs, with sinus bradycardia, asymptomatic   CV risk counseling and prevention -recommend heart healthy/Mediterranean diet, with whole grains, fruits, vegetable, fish, lean meats, nuts, and olive oil. Limit salt. -recommend moderate walking, 3-5 times/week for 30-50 minutes each session. Aim for at least 150 minutes.week. Goal should be pace of 3 miles/hours, or walking 1.5 miles in 30 minutes -recommend avoidance of tobacco products. Avoid excess alcohol.  Dispo: 1 year or sooner as needed  Signed, Shelda Bruckner, MD   Shelda Bruckner, MD, PhD, [facility]  [facility]  [place]  [facility] at [facility] at [facility] [address] [place], [place] [address] [phone]

## 2023-09-10 ENCOUNTER — Encounter (HOSPITAL_BASED_OUTPATIENT_CLINIC_OR_DEPARTMENT_OTHER): Payer: Self-pay | Admitting: Cardiology

## 2023-09-10 ENCOUNTER — Ambulatory Visit (HOSPITAL_BASED_OUTPATIENT_CLINIC_OR_DEPARTMENT_OTHER): Admitting: Cardiology

## 2023-09-10 VITALS — BP 124/64 | HR 42 | Ht 66.0 in | Wt 167.0 lb

## 2023-09-10 DIAGNOSIS — Z951 Presence of aortocoronary bypass graft: Secondary | ICD-10-CM

## 2023-09-10 DIAGNOSIS — I2510 Atherosclerotic heart disease of native coronary artery without angina pectoris: Secondary | ICD-10-CM

## 2023-09-10 DIAGNOSIS — E785 Hyperlipidemia, unspecified: Secondary | ICD-10-CM | POA: Diagnosis not present

## 2023-09-10 DIAGNOSIS — I10 Essential (primary) hypertension: Secondary | ICD-10-CM

## 2023-09-10 DIAGNOSIS — I493 Ventricular premature depolarization: Secondary | ICD-10-CM

## 2023-09-10 DIAGNOSIS — R0609 Other forms of dyspnea: Secondary | ICD-10-CM

## 2023-09-10 MED ORDER — METOPROLOL SUCCINATE ER 25 MG PO TB24
25.0000 mg | ORAL_TABLET | Freq: Every day | ORAL | 3 refills | Status: AC
Start: 2023-09-10 — End: ?

## 2023-09-10 NOTE — Patient Instructions (Signed)
 Medication Instructions:  Your physician recommends that you continue on your current medications as directed. Please refer to the Current Medication list given to you today.  *If you need a refill on your cardiac medications before your next appointment, please call your pharmacy*  Lab Work: NONE  Testing/Procedures: NONE  Follow-Up: At [facility], you and your health needs are our priority.  As part of our continuing mission to provide you with exceptional heart care, we have created designated Provider Care Teams.  These Care Teams include your primary Cardiologist (physician) and Advanced Practice Providers (APPs -  Physician Assistants and Nurse Practitioners) who all work together to provide you with the care you need, when you need it.  We recommend signing up for the patient portal called MyChart.  Sign up information is provided on this After Visit Summary.  MyChart is used to connect with patients for Virtual Visits (Telemedicine).  Patients are able to view lab/test results, encounter notes, upcoming appointments, etc.  Non-urgent messages can be sent to your provider as well.   To learn more about what you can do with MyChart, go to [email or web address].    Your next appointment:   12 month(s)  The format for your next appointment:   In Person  Provider:   Dr Lonni, Reche ORN NP, or Rosaline RAMAN NP

## 2023-09-14 ENCOUNTER — Encounter (HOSPITAL_BASED_OUTPATIENT_CLINIC_OR_DEPARTMENT_OTHER): Payer: Self-pay

## 2023-09-18 ENCOUNTER — Ambulatory Visit (HOSPITAL_BASED_OUTPATIENT_CLINIC_OR_DEPARTMENT_OTHER): Payer: Medicare Other

## 2023-09-18 DIAGNOSIS — I6523 Occlusion and stenosis of bilateral carotid arteries: Secondary | ICD-10-CM | POA: Diagnosis not present

## 2023-09-22 ENCOUNTER — Other Ambulatory Visit: Payer: Self-pay | Admitting: Family Medicine

## 2023-09-23 ENCOUNTER — Ambulatory Visit (HOSPITAL_BASED_OUTPATIENT_CLINIC_OR_DEPARTMENT_OTHER): Payer: Self-pay | Admitting: Family

## 2023-09-23 DIAGNOSIS — I6523 Occlusion and stenosis of bilateral carotid arteries: Secondary | ICD-10-CM

## 2023-09-23 NOTE — Telephone Encounter (Signed)
-----   Message from Reche GORMAN Finder sent at 09/23/2023  7:40 AM EDT ----- Right carotid with 40-59% stenosis in left carotid with 1-39% stenosis.  This is mild and stable from previous.  Repeat carotid duplex in 2 years for monitoring.  Continue atorvastatin , aspirin  to  prevent progression. ----- Message ----- From: Interface, Three One Seven Sent: 09/18/2023  12:09 PM EDT To: Reche GORMAN Finder, NP

## 2023-09-23 NOTE — Telephone Encounter (Signed)
 Left the pt a message to call the office back to endorse carotid US  results per Reche Finder, NP.   Will go ahead and place the 2 year carotid US  order in the system and have our PV Scheduler reach out to the pt closer to that time frame to arrange that appt.   Pt aware in the message that results were also sent to her mychart account to review, if he chooses to view vs calling back for results.

## 2023-10-02 ENCOUNTER — Ambulatory Visit: Admitting: Sports Medicine

## 2023-10-02 VITALS — BP 130/60 | Ht 66.0 in | Wt 165.0 lb

## 2023-10-02 DIAGNOSIS — Q6672 Congenital pes cavus, left foot: Secondary | ICD-10-CM

## 2023-10-02 DIAGNOSIS — Q6671 Congenital pes cavus, right foot: Secondary | ICD-10-CM | POA: Diagnosis not present

## 2023-10-02 NOTE — Assessment & Plan Note (Signed)
 His specialized orthopedic inserts have been helping considerably Because he has multiple shoes he asked for a second pair today  Patient was fitted for a : standard, cushioned, semi-rigid orthotic. The orthotic was heated and afterward the patient stood on the orthotic blank positioned on the orthotic stand. The patient was positioned in subtalar neutral position and 10 degrees of ankle dorsiflexion in a weight bearing stance. After completion of molding, a stable base was applied to the orthotic blank. The blank was ground to a stable position for weight bearing. Size: 10 supercell forefoot padded Base:Incorporated Posting: none Additional orthotic padding: uses medium MT pad  He will adjust these to his shoes and we will make any corrections as needed  To take pressure off of his gastrocnemius strain I gave him heel lifts to use bilaterally and started him on gentle calf raise exercises to help strengthen this area  He will return pending response

## 2023-10-02 NOTE — Progress Notes (Signed)
 CC: bilat. Forefoot pain  Patient was put into custom orthotics with supercell padding for adventitial bursitis He has done very well with the orthotics His foot pain is less and he is walking about 10,000 steps per day He does have gout that has been strictly limited to his great toe Unclear whether sometimes his pain is triggered by this type of inflammation but he has rare bouts of gout His father also had gout  Another issue is a gastrocnemius tear of his right calf He was seen at [facility] He has a compression wrap and is gradually making some progress He has not started any rehabilitation exercises  Physical exam Pleasant male in no acute distress Blood pressure 130/60, height 5' 6 (1.676 m), weight 165 lb (74.8 kg).  Feet show cavus shape There is much less tenderness on the distal forefoot from the metatarsal heads distally toward the toes This area does tend to remain somewhat puffy Right calf has some tenderness right over the medial gastroc and has a good compression wrap

## 2023-10-20 ENCOUNTER — Ambulatory Visit (INDEPENDENT_AMBULATORY_CARE_PROVIDER_SITE_OTHER): Payer: Medicare Other

## 2023-10-20 VITALS — Wt 165.0 lb

## 2023-10-20 DIAGNOSIS — Z0000 Encounter for general adult medical examination without abnormal findings: Secondary | ICD-10-CM | POA: Diagnosis not present

## 2023-10-20 NOTE — Progress Notes (Signed)
 Subjective:   Nathaniel Bender. is a 73 y.o. who presents for a Medicare Wellness preventive visit.  As a reminder, Annual Wellness Visits don't include a physical exam, and some assessments may be limited, especially if this visit is performed virtually. We may recommend an in-person follow-up visit with your provider if needed.  Visit Complete: Virtual I connected with  Nathaniel Bender. on 10/20/23 by a video and audio enabled telemedicine application and verified that I am speaking with the correct person using two identifiers.  Patient Location: Home  Provider Location: Office/Clinic  I discussed the limitations of evaluation and management by telemedicine. The patient expressed understanding and agreed to proceed.  Vital Signs: Because this visit was a virtual/telehealth visit, some criteria may be missing or patient reported. Any vitals not documented were not able to be obtained and vitals that have been documented are patient reported.    Persons Participating in Visit: Patient.  AWV Questionnaire: No: Patient Medicare AWV questionnaire was not completed prior to this visit.  Cardiac Risk Factors include: advanced age (>38men, >58 women);dyslipidemia;hypertension;male gender     Objective:    Today's Vitals   10/20/23 1055  Weight: 165 lb (74.8 kg)   Body mass index is 26.63 kg/m.     10/20/2023   11:01 AM 10/15/2022   11:53 AM 07/02/2021    1:10 PM 05/06/2021    8:00 PM 05/06/2021    1:07 PM 08/06/2019   10:43 AM 01/01/2018    9:35 AM  Advanced Directives  Does Patient Have a Medical Advance Directive? Yes Yes Yes Yes Yes Yes Yes   Type of Estate agent of [place];Living will Healthcare Power of [place];Living will Healthcare Power of [place];Living will Healthcare Power of [place];Living will   Healthcare Power of [place];Living will  Does patient want to make changes to medical advance directive?   No - Patient declined No - Patient  declined No - Patient declined Yes (Inpatient - patient defers changing a medical advance directive and declines information at this time) Yes (ED - Information included in AVS)   Copy of Healthcare Power of Attorney in Chart? No - copy requested No - copy requested No - copy requested No - copy requested   No - copy requested      Data saved with a previous flowsheet row definition    Current Medications (verified) Outpatient Encounter Medications as of 10/20/2023  Medication Sig   amLODipine  (NORVASC ) 10 MG tablet Take 1 tablet (10 mg total) by mouth daily.   aspirin  (ASPIRIN  81) 81 MG chewable tablet Chew 81 mg by mouth daily.   atorvastatin  (LIPITOR ) 40 MG tablet Take 1 tablet (40 mg total) by mouth daily.   clindamycin  (CLEOCIN  T) 1 % external solution Apply topically as needed.   ezetimibe  (ZETIA ) 10 MG tablet Take 1 tablet (10 mg total) by mouth daily.   famotidine -calcium  carbonate-magnesium  hydroxide (PEPCID  COMPLETE) 10-800-165 MG chewable tablet Chew 1 tablet by mouth 2 (two) times daily as needed.   Ibuprofen-diphenhydrAMINE Cit (MOTRIN PM PO) Take by mouth.   losartan  (COZAAR ) 50 MG tablet Take 1 tablet (50 mg total) by mouth daily.   metoprolol  succinate (TOPROL -XL) 25 MG 24 hr tablet Take 1 tablet (25 mg total) by mouth daily.   omeprazole  (PRILOSEC) 10 MG capsule TAKE 1 CAPSULE BY MOUTH DAILY   sildenafil  (VIAGRA ) 100 MG tablet Take 1 tablet (100 mg total) by mouth daily as needed for erectile dysfunction.   Turmeric (QC  TUMERIC COMPLEX PO) Take by mouth daily.   [DISCONTINUED] colchicine  0.6 MG tablet Take 2 tabs immediately, then 1 tab twice per day for the duration of the flare up to a max of 7 days (but discontinue for stomach pains or diarrhea)   [DISCONTINUED] fluorouracil  (EFUDEX ) 5 % cream Apply topically 2 (two) times daily. Use for 2 weeks then stop.   No facility-administered encounter medications on file as of 10/20/2023.    Allergies (verified) Patient has no  known allergies.   History: Past Medical History:  Diagnosis Date   Acute respiratory failure with hypoxia (HCC)    Arthritis    Degenerative Disc Disease Cervical; s/p injection [facility].   Atypical mole 05/04/2013   Right Wrist-Severe   CAD (coronary artery disease)    Chest pain 05/06/2021   Colon polyps    Hyperlipidemia    Hypertension    Kidney stone    Meniere's disease of left ear    s/p ENT evaluation; acute hearing loss; chronic tinnitus L now.  Recovered hearing completely.   Sudden idiopathic hearing loss 11/20/2011   Seen by [facility]NT 10/2011     Past Surgical History:  Procedure Laterality Date   CLIPPING OF ATRIAL APPENDAGE Left 05/08/2021   Procedure: CLIPPING OF ATRIAL APPENDAGE;  Surgeon: Shyrl Linnie KIDD, MD;  Location: [facility] OR;  Service: Open Heart Surgery;  Laterality: Left;   CORONARY ARTERY BYPASS GRAFT N/A 05/08/2021   Procedure: CORONARY ARTERY BYPASS GRAFTING x4 USING LEFT INTERNAL MAMMORY ARTERY AND BILATERAL GREATER SAPHENOUS VEIN;  Surgeon: Shyrl Linnie KIDD, MD;  Location: [facility] OR;  Service: Open Heart Surgery;  Laterality: N/A;  atriclip placement.  flow trac   ENDOVEIN HARVEST OF GREATER SAPHENOUS VEIN Bilateral 05/08/2021   Procedure: ENDOVEIN HARVEST OF GREATER SAPHENOUS VEIN;  Surgeon: Shyrl Linnie KIDD, MD;  Location: [facility] OR;  Service: Open Heart Surgery;  Laterality: Bilateral;   LEFT HEART CATH AND CORONARY ANGIOGRAPHY N/A 05/07/2021   Procedure: LEFT HEART CATH AND CORONARY ANGIOGRAPHY;  Surgeon: Mady Bruckner, MD;  Location: [facility] INVASIVE CV LAB;  Service: Cardiovascular;  Laterality: N/A;   ROTATOR CUFF REPAIR Right    TEE WITHOUT CARDIOVERSION  05/08/2021   Procedure: TRANSESOPHAGEAL ECHOCARDIOGRAM (TEE);  Surgeon: Shyrl Linnie KIDD, MD;  [facility];  Service: Open Heart Surgery;;   TONSILLECTOMY     Family History  Problem Relation Age of Onset   Hyperlipidemia Mother    Hypertension Mother    Arthritis Mother 73        DDD lumbar spine   Diabetes Father    Heart disease Father 42       CAD/CABG   Hyperlipidemia Father    Hypertension Father    Arthritis Sister    Hyperlipidemia Sister    Hypertension Sister    Arthritis Maternal Grandmother    Heart disease Maternal Grandfather    Arthritis Paternal Grandmother    Mental illness Paternal Grandmother    Heart disease Paternal Grandfather    Stroke Paternal Grandfather    Social History   Socioeconomic History   Marital status: Married    Spouse name: Not on file   Number of children: 0   Years of education: Not on file   Highest education level: Bachelor's degree (e.g., BA, AB, BS)  Occupational History   Occupation: Sales  Tobacco Use   Smoking status: Never    Passive exposure: Never   Smokeless tobacco: Never  Vaping Use   Vaping status: Never Used  Substance and Sexual Activity   Alcohol use: Yes    Alcohol/week: 12.0 standard drinks of alcohol    Types: 10 Glasses of wine, 2 Shots of liquor per week    Comment: 2/d   Drug use: Not Currently    Types: Cocaine, Marijuana, LSD   Sexual activity: Yes    Birth control/protection: None  Other Topics Concern   Not on file  Social History Narrative   Marital:  Married. X 26 years.        Children: none      Lives: with wife       Education: College/Other. Exercise: Yes.      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      Tobacco: never      Alcohol:  2-3 per day; more on weekends; wine.      Exercise:  Walking 2 days per week; 2-4 miles.      Seatbelt:  100%      Guns: 12 gauge shot gun.      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     Plays in band,  board of non-profit-[employer]   Social Drivers of Health   Financial Resource Strain: Low Risk  (10/20/2023)   Overall Financial Resource Strain (CARDIA)    Difficulty of Paying Living Expenses: Not  hard at all  Food Insecurity: No Food Insecurity (10/20/2023)   Hunger Vital Sign    Worried About Running Out of Food in the Last Year: Never true    Ran Out of Food in the Last Year: Never true  Transportation Needs: No Transportation Needs (10/20/2023)   PRAPARE - Administrator, Civil Service (Medical): No    Lack of Transportation (Non-Medical): No  Physical Activity: Sufficiently Active (10/20/2023)   Exercise Vital Sign    Days of Exercise per Week: 6 days    Minutes of Exercise per Session: 60 min  Stress: No Stress Concern Present (10/20/2023)   [employer] of Occupational Health - Occupational Stress Questionnaire    Feeling of Stress: Not at all  Social Connections: Moderately Integrated (10/20/2023)   Social Connection and Isolation Panel    Frequency of Communication with Friends and Family: More than three times a week    Frequency of Social Gatherings with Friends and Family: More than three times a week    Attends Religious Services: Never    Database administrator or Organizations: Yes    Attends Engineer, structural: 1 to 4 times per year    Marital Status: Married    Tobacco Counseling Counseling given: Not Answered    Clinical Intake:  Pre-visit preparation completed: Yes  Pain : No/denies pain     BMI - recorded: 26.63 Nutritional Status: BMI 25 -29 Overweight Nutritional Risks: None Diabetes: No  Lab Results  Component Value Date   HGBA1C 6.2 07/09/2023   HGBA1C 6.3 12/26/2022   HGBA1C 6.1 (H) 05/08/2021     How often do you need to have someone help you when you read instructions, pamphlets, or other written materials from your doctor or pharmacy?: 1 - Never  Interpreter Needed?: No  Information entered by :: Ellouise Haws, LPN   Activities of Daily Living     10/20/2023   10:59 AM  In your present state of health, do you have any difficulty performing  the following activities:  Hearing? 0  Vision? 0   Difficulty concentrating or making decisions? 0  Walking or climbing stairs? 0  Dressing or bathing? 0  Doing errands, shopping? 0  Preparing Food and eating ? N  Using the Toilet? N  In the past six months, have you accidently leaked urine? N  Do you have problems with loss of bowel control? N  Managing your Medications? N  Managing your Finances? N  Housekeeping or managing your Housekeeping? N    Patient Care Team: Wendolyn Jenkins Jansky, MD as PCP - General (Family Medicine) Lonni Slain, MD as PCP - Cardiology (Cardiology) Porter Andrez SAUNDERS, PA-C (Inactive) as Physician Assistant (Dermatology) Alm Delon SAILOR, DO as Consulting Physician (Dermatology)  I have updated your Care Teams any recent Medical Services you may have received from other providers in the past year.     Assessment:   This is a routine wellness examination for Nathaniel.  Hearing/Vision screen Hearing Screening - Comments:: Pt denies any hearing issues  Vision Screening - Comments:: Wears rx glasses - up to date with routine eye exams with Dr [facility]    Goals Addressed             This Visit's Progress    Patient Stated       Start new exercise plan  yoga        Depression Screen     10/20/2023   11:02 AM 10/15/2022   11:53 AM 08/19/2022    1:30 PM 07/02/2021   12:39 PM 08/06/2019   10:39 AM 02/05/2019    1:43 PM 07/03/2018    9:56 AM  PHQ 2/9 Scores  PHQ - 2 Score 0 0 0 0 0 0 0  PHQ- 9 Score    4       Fall Risk     10/20/2023   11:03 AM 10/15/2022   11:55 AM 08/19/2022    1:30 PM 07/02/2021   12:37 PM 08/06/2019   10:38 AM  Fall Risk   Falls in the past year? 0 0 0 0 0  Number falls in past yr: 0 0 0 0 0  Injury with Fall? 0 0 0 0 0  Risk for fall due to : No Fall Risks Impaired vision No Fall Risks No Fall Risks No Fall Risks  Follow up Falls prevention discussed Falls prevention discussed Falls evaluation completed Falls evaluation completed  Falls  evaluation completed      Data saved with a previous flowsheet row definition    MEDICARE RISK AT HOME:  Medicare Risk at Home Any stairs in or around the home?: Yes If so, are there any without handrails?: No Home free of loose throw rugs in walkways, pet beds, electrical cords, etc?: Yes Adequate lighting in your home to reduce risk of falls?: Yes Life alert?: No Use of a cane, walker or w/c?: No Grab bars in the bathroom?: Yes Shower chair or bench in shower?: Yes Elevated toilet seat or a handicapped toilet?: No  TIMED UP AND GO:  Was the test performed?  No  Cognitive Function: 6CIT completed        10/20/2023   11:04 AM 10/15/2022   11:57 AM 08/06/2019   10:39 AM 01/01/2018    9:33 AM  6CIT Screen  What Year? 0 points 0 points 0 points 0 points  What month? 0 points 0 points 0 points 0 points  What time? 0 points 0 points 0 points  0 points  Count back from 20 0 points 0 points 0 points 0 points  Months in reverse 0 points 0 points 0 points 0 points  Repeat phrase 0 points 0 points 0 points 0 points  Total Score 0 points 0 points 0 points 0 points    Immunizations Immunization History  Administered Date(s) Administered   INFLUENZA, HIGH DOSE SEASONAL PF 01/01/2018, 11/30/2018   Influenza Split 11/10/2011, 10/21/2012, 01/20/2016   Influenza,inj,Quad PF,6+ Mos 12/29/2013, 01/13/2015, 12/16/2016   Influenza,trivalent, recombinat, inj, PF 12/17/2022   Influenza-Unspecified 02/07/2020, 11/18/2020, 01/17/2022   PFIZER(Purple Top)SARS-COV-2 Vaccination 02/10/2019, 03/01/2019, 09/30/2019, 06/10/2020, 12/20/2020   PNEUMOCOCCAL CONJUGATE-20 02/02/2022   Pfizer(Comirnaty)Fall Seasonal Vaccine 12 years and older 09/30/2022   Pneumococcal Conjugate-13 06/02/2017   Respiratory Syncytial Virus Vaccine,Recomb Aduvanted(Arexvy) 10/17/2021   Td 01/22/2008   Tdap 03/13/2023   Zoster Recombinant(Shingrix) 02/02/2022   Zoster, Live 01/21/2010    Screening Tests Health  Maintenance  Topic Date Due   Zoster Vaccines- Shingrix (2 of 2) 03/30/2022   Influenza Vaccine  08/22/2023   COVID-19 Vaccine (7 - Pfizer risk 2024-25 season) 09/22/2023   Colonoscopy  04/17/2024   Medicare Annual Wellness (AWV)  10/19/2024   DTaP/Tdap/Td (3 - Td or Tdap) 03/12/2033   Pneumococcal Vaccine: 50+ Years  Completed   Hepatitis C Screening  Completed   HPV VACCINES  Aged Out   Meningococcal B Vaccine  Aged Out    Health Maintenance Items Addressed: See Nurse Notes at the end of this note  Additional Screening:  Vision Screening: Recommended annual ophthalmology exams for early detection of glaucoma and other disorders of the eye. Is the patient up to date with their annual eye exam?  Yes  Who is the provider or what is the name of the office in which the patient attends annual eye exams? Dr [facility]   Dental Screening: Recommended annual dental exams for proper oral hygiene  Community Resource Referral / Chronic Care Management: CRR required this visit?  No   CCM required this visit?  No   Plan:    I have personally reviewed and noted the following in the patient's chart:   Medical and social history Use of alcohol, tobacco or illicit drugs  Current medications and supplements including opioid prescriptions. Patient is not currently taking opioid prescriptions. Functional ability and status Nutritional status Physical activity Advanced directives List of other physicians Hospitalizations, surgeries, and ER visits in previous 12 months Vitals Screenings to include cognitive, depression, and falls Referrals and appointments  In addition, I have reviewed and discussed with patient certain preventive protocols, quality metrics, and best practice recommendations. A written personalized care plan for preventive services as well as general preventive health recommendations were provided to patient.   Ellouise VEAR Haws, LPN   [id number]    After Visit Summary: (MyChart) Due to this being a telephonic visit, the after visit summary with patients personalized plan was offered to patient via MyChart   Notes: Nothing significant to report at this time.

## 2023-10-20 NOTE — Patient Instructions (Signed)
 Mr. Nathaniel Bender,  Thank you for taking the time for your Medicare Wellness Visit. I appreciate your continued commitment to your health goals. Please review the care plan we discussed, and feel free to reach out if I can assist you further.  Medicare recommends these wellness visits once per year to help you and your care team stay ahead of potential health issues. These visits are designed to focus on prevention, allowing your provider to concentrate on managing your acute and chronic conditions during your regular appointments.  Please note that Annual Wellness Visits do not include a physical exam. Some assessments may be limited, especially if the visit was conducted virtually. If needed, we may recommend a separate in-person follow-up with your provider.  Ongoing Care Seeing your primary care provider every 3 to 6 months helps us  monitor your health and provide consistent, personalized care.   Referrals If a referral was made during today's visit and you haven't received any updates within two weeks, please contact the referred provider directly to check on the status.  Recommended Screenings:  Health Maintenance  Topic Date Due   Zoster (Shingles) Vaccine (2 of 2) 03/30/2022   Flu Shot  08/22/2023   COVID-19 Vaccine (7 - Pfizer risk 2024-25 season) 09/22/2023   Colon Cancer Screening  04/17/2024   Medicare Annual Wellness Visit  10/19/2024   DTaP/Tdap/Td vaccine (3 - Td or Tdap) 03/12/2033   Pneumococcal Vaccine for age over 35  Completed   Hepatitis C Screening  Completed   HPV Vaccine  Aged Out   Meningitis B Vaccine  Aged Out       10/20/2023   11:01 AM  Advanced Directives  Does Patient Have a Medical Advance Directive? Yes  Type of Estate agent of [place];Living will  Copy of Healthcare Power of Attorney in Chart? No - copy requested   Advance Care Planning is important because it: Ensures you receive medical care that aligns with your values, goals,  and preferences. Provides guidance to your family and loved ones, reducing the emotional burden of decision-making during critical moments.  Vision: Annual vision screenings are recommended for early detection of glaucoma, cataracts, and diabetic retinopathy. These exams can also reveal signs of chronic conditions such as diabetes and high blood pressure.  Dental: Annual dental screenings help detect early signs of oral cancer, gum disease, and other conditions linked to overall health, including heart disease and diabetes.  Please see the attached documents for additional preventive care recommendations.

## 2023-11-01 ENCOUNTER — Encounter: Payer: Self-pay | Admitting: Family Medicine

## 2023-12-02 ENCOUNTER — Ambulatory Visit (INDEPENDENT_AMBULATORY_CARE_PROVIDER_SITE_OTHER): Admitting: Dermatology

## 2023-12-02 ENCOUNTER — Encounter: Payer: Self-pay | Admitting: Dermatology

## 2023-12-02 VITALS — BP 106/88 | HR 60

## 2023-12-02 DIAGNOSIS — W908XXA Exposure to other nonionizing radiation, initial encounter: Secondary | ICD-10-CM | POA: Diagnosis not present

## 2023-12-02 DIAGNOSIS — D489 Neoplasm of uncertain behavior, unspecified: Secondary | ICD-10-CM

## 2023-12-02 DIAGNOSIS — L858 Other specified epidermal thickening: Secondary | ICD-10-CM | POA: Diagnosis not present

## 2023-12-02 DIAGNOSIS — W57XXXA Bitten or stung by nonvenomous insect and other nonvenomous arthropods, initial encounter: Secondary | ICD-10-CM | POA: Diagnosis not present

## 2023-12-02 DIAGNOSIS — L570 Actinic keratosis: Secondary | ICD-10-CM

## 2023-12-02 MED ORDER — FLUOROURACIL 5 % EX CREA
TOPICAL_CREAM | CUTANEOUS | 0 refills | Status: AC
Start: 2023-12-02 — End: ?

## 2023-12-02 NOTE — Progress Notes (Signed)
 Follow-Up Visit   Patient (and/or pt guardian) consented to the use of AI-assisted tools for note generation.  Subjective  Nathaniel Bender. is a 73 y.o. male who presents for the following: Tick bite that has previously been treated & AKs  Patient was last evaluated on 08/28/23.  Patient reports sxs are unchanged. Patient denies medication changes.  The following portions of the chart were reviewed this encounter and updated as appropriate: medications, allergies, medical history  Review of Systems:  No other skin or systemic complaints except as noted in HPI or Assessment and Plan.  Objective  Well appearing patient in no apparent distress; mood and affect are within normal limits.  A focused examination was performed of the following areas: Back  Relevant exam findings are noted in the Assessment and Plan.            Mid Back 8mm pink papule  Assessment & Plan   ACTINIC KERATOSIS Exam: Erythematous thin papules/macules with gritty scale at the face  Actinic keratoses are precancerous spots that appear secondary to cumulative UV radiation exposure/sun exposure over time. They are chronic with expected duration over 1 year. A portion of actinic keratoses will progress to squamous cell carcinoma of the skin. It is not possible to reliably predict which spots will progress to skin cancer and so treatment is recommended to prevent development of skin cancer.  Recommend daily broad spectrum sunscreen SPF 30+ to sun-exposed areas, reapply every 2 hours as needed.  Recommend staying in the shade or wearing long sleeves, sun glasses (UVA+UVB protection) and wide brim hats (4-inch brim around the entire circumference of the hat). Call for new or changing lesions.  Treatment Plan: Start 5-fluorouracil  cream twice a day for 14 days to affected areas  Reviewed course of treatment and expected reaction.  Patient advised to expect inflammation and crusting and advised that erosions are  possible.  Patient advised to be diligent with sun protection during and after treatment. Handout with details of how to apply medication and what to expect provided. Counseled to keep medication out of reach of children and pets.  Reviewed course of treatment and expected reaction.  Patient advised to expect inflammation and crusting and advised that erosions are possible.  Patient advised to be diligent with sun protection during and after treatment. Handout with details of how to apply medication and what to expect provided. Counseled to keep medication out of reach of children and pets.    NEOPLASM OF UNCERTAIN BEHAVIOR Mid Back Epidermal / dermal shaving  Lesion diameter (cm):  0.8 Informed consent: discussed and consent obtained   Timeout: patient name, date of birth, surgical site, and procedure verified   Procedure prep:  Patient was prepped and draped in usual sterile fashion Prep type:  Isopropyl alcohol Instrument used: DermaBlade   Hemostasis achieved with: aluminum chloride   Outcome: patient tolerated procedure well   Post-procedure details: wound care instructions given   Additional details:  8mm pink papule  Specimen A - Surgical pathology Differential Diagnosis: r/o BCC vs tick granuloma  Check Margins: No AK (ACTINIC KERATOSIS) (6) Left Temple (2), Left Zygomatic Area, Right Temple (2), Right Zygomatic Area Destruction of lesion - Left Temple (2), Left Zygomatic Area, Right Temple (2), Right Zygomatic Area  Destruction method: cryotherapy   Timeout:  patient name, date of birth, surgical site, and procedure verified Cryotherapy cycles:  6 Outcome: patient tolerated procedure well with no complications   Post-procedure details: wound care instructions given  Return for FBSE (Pt states it is already scheduled).  I, Lyle Cords, as acting as a neurosurgeon for [employer], DO .   Documentation: I have reviewed the above documentation for accuracy and  completeness, and I agree with the above.  Delon Lenis, DO

## 2023-12-02 NOTE — Patient Instructions (Addendum)

## 2023-12-03 LAB — SURGICAL PATHOLOGY

## 2023-12-04 ENCOUNTER — Ambulatory Visit: Admitting: Sports Medicine

## 2023-12-04 VITALS — BP 122/68 | Ht 66.0 in | Wt 160.0 lb

## 2023-12-04 DIAGNOSIS — Q6672 Congenital pes cavus, left foot: Secondary | ICD-10-CM | POA: Diagnosis not present

## 2023-12-04 DIAGNOSIS — Q6671 Congenital pes cavus, right foot: Secondary | ICD-10-CM | POA: Diagnosis not present

## 2023-12-04 NOTE — Assessment & Plan Note (Signed)
 He has responded well to custom orthotics with added forefoot padding MT pads are also helpful  Continue these and replace when cushion is not enough  Try massaging voltaren cream over the adventitial fat pad tid  F/U as needed

## 2023-12-04 NOTE — Progress Notes (Signed)
 CC: Bilat. Forefoot pain  Patient had limitation with walking from persistent forefoot pain We made supercell padded orthotics with MT pads Since then he has gradually increased his walking Still has some pain but now walking up to 1 to 2 miles at a time and before dreaded walking 400 yards Comes for recheck  PE Pleasant M in NAD BP 122/68   Ht 5' 6 (1.676 m)   Wt 160 lb (72.6 kg)   BMI 25.82 kg/m   Cavus foot shape With standing some transverse arch flattening at plantar plate No toe splaying or hammer toes Distal fat pad at forefoot is puffy but less than before No pain over MT diaphysis Minimal pain on palpation of fat pad Walks with no limp

## 2023-12-10 ENCOUNTER — Ambulatory Visit: Payer: Self-pay | Admitting: Dermatology

## 2023-12-15 ENCOUNTER — Encounter: Payer: Self-pay | Admitting: Family Medicine

## 2023-12-15 ENCOUNTER — Other Ambulatory Visit: Payer: Self-pay

## 2023-12-15 DIAGNOSIS — I10 Essential (primary) hypertension: Secondary | ICD-10-CM

## 2023-12-15 MED ORDER — LOSARTAN POTASSIUM 50 MG PO TABS: 50.0000 mg | ORAL_TABLET | Freq: Every day | ORAL | 0 refills | Status: DC

## 2023-12-26 ENCOUNTER — Telehealth: Admitting: Family Medicine

## 2023-12-26 DIAGNOSIS — J069 Acute upper respiratory infection, unspecified: Secondary | ICD-10-CM

## 2023-12-26 MED ORDER — PREDNISONE 10 MG (21) PO TBPK: ORAL_TABLET | ORAL | 0 refills | Status: DC

## 2023-12-26 MED ORDER — PROMETHAZINE-DM 6.25-15 MG/5ML PO SYRP: 5.0000 mL | ORAL_SOLUTION | Freq: Four times a day (QID) | ORAL | 0 refills | Status: AC | PRN

## 2023-12-26 NOTE — Progress Notes (Signed)

## 2024-01-02 ENCOUNTER — Other Ambulatory Visit: Payer: Self-pay | Admitting: Medical Genetics

## 2024-01-09 ENCOUNTER — Encounter: Admitting: Family Medicine

## 2024-01-16 ENCOUNTER — Encounter: Payer: Self-pay | Admitting: Family Medicine

## 2024-01-23 ENCOUNTER — Encounter: Payer: Self-pay | Admitting: Family Medicine

## 2024-01-23 ENCOUNTER — Ambulatory Visit (INDEPENDENT_AMBULATORY_CARE_PROVIDER_SITE_OTHER): Admitting: Family Medicine

## 2024-01-23 VITALS — BP 136/80 | HR 44 | Temp 97.2°F | Ht 66.0 in | Wt 166.8 lb

## 2024-01-23 DIAGNOSIS — Z0000 Encounter for general adult medical examination without abnormal findings: Secondary | ICD-10-CM

## 2024-01-23 DIAGNOSIS — R351 Nocturia: Secondary | ICD-10-CM | POA: Diagnosis not present

## 2024-01-23 DIAGNOSIS — I10 Essential (primary) hypertension: Secondary | ICD-10-CM | POA: Diagnosis not present

## 2024-01-23 DIAGNOSIS — I2510 Atherosclerotic heart disease of native coronary artery without angina pectoris: Secondary | ICD-10-CM

## 2024-01-23 DIAGNOSIS — Z1211 Encounter for screening for malignant neoplasm of colon: Secondary | ICD-10-CM | POA: Diagnosis not present

## 2024-01-23 DIAGNOSIS — R7303 Prediabetes: Secondary | ICD-10-CM | POA: Diagnosis not present

## 2024-01-23 DIAGNOSIS — E7800 Pure hypercholesterolemia, unspecified: Secondary | ICD-10-CM | POA: Diagnosis not present

## 2024-01-23 LAB — LIPID PANEL
Cholesterol: 131 mg/dL (ref 28–200)
HDL: 51.5 mg/dL
LDL Cholesterol: 48 mg/dL (ref 10–99)
NonHDL: 79.13
Total CHOL/HDL Ratio: 3
Triglycerides: 156 mg/dL — ABNORMAL HIGH (ref 10.0–149.0)
VLDL: 31.2 mg/dL (ref 0.0–40.0)

## 2024-01-23 LAB — COMPREHENSIVE METABOLIC PANEL WITH GFR
ALT: 26 U/L (ref 3–53)
AST: 20 U/L (ref 5–37)
Albumin: 4.7 g/dL (ref 3.5–5.2)
Alkaline Phosphatase: 72 U/L (ref 39–117)
BUN: 22 mg/dL (ref 6–23)
CO2: 30 meq/L (ref 19–32)
Calcium: 9.3 mg/dL (ref 8.4–10.5)
Chloride: 101 meq/L (ref 96–112)
Creatinine, Ser: 1.02 mg/dL (ref 0.40–1.50)
GFR: 73.71 mL/min
Glucose, Bld: 76 mg/dL (ref 70–99)
Potassium: 4.2 meq/L (ref 3.5–5.1)
Sodium: 140 meq/L (ref 135–145)
Total Bilirubin: 1 mg/dL (ref 0.2–1.2)
Total Protein: 7.2 g/dL (ref 6.0–8.3)

## 2024-01-23 LAB — CBC WITH DIFFERENTIAL/PLATELET
Basophils Absolute: 0.1 K/uL (ref 0.0–0.1)
Basophils Relative: 0.9 % (ref 0.0–3.0)
Eosinophils Absolute: 0.6 K/uL (ref 0.0–0.7)
Eosinophils Relative: 8.9 % — ABNORMAL HIGH (ref 0.0–5.0)
HCT: 47.4 % (ref 39.0–52.0)
Hemoglobin: 15.8 g/dL (ref 13.0–17.0)
Lymphocytes Relative: 31.9 % (ref 12.0–46.0)
Lymphs Abs: 2 K/uL (ref 0.7–4.0)
MCHC: 33.3 g/dL (ref 30.0–36.0)
MCV: 90 fl (ref 78.0–100.0)
Monocytes Absolute: 0.7 K/uL (ref 0.1–1.0)
Monocytes Relative: 10.8 % (ref 3.0–12.0)
Neutro Abs: 3 K/uL (ref 1.4–7.7)
Neutrophils Relative %: 47.5 % (ref 43.0–77.0)
Platelets: 134 K/uL — ABNORMAL LOW (ref 150.0–400.0)
RBC: 5.27 Mil/uL (ref 4.22–5.81)
RDW: 14 % (ref 11.5–15.5)
WBC: 6.3 K/uL (ref 4.0–10.5)

## 2024-01-23 LAB — TSH: TSH: 1.85 u[IU]/mL (ref 0.35–5.50)

## 2024-01-23 LAB — HEMOGLOBIN A1C: Hgb A1c MFr Bld: 6.3 % (ref 4.6–6.5)

## 2024-01-23 LAB — PSA: PSA: 2.33 ng/mL (ref 0.10–4.00)

## 2024-01-23 MED ORDER — LOSARTAN POTASSIUM 50 MG PO TABS: 50.0000 mg | ORAL_TABLET | Freq: Every day | ORAL | 3 refills | Status: AC

## 2024-01-23 MED ORDER — AMLODIPINE BESYLATE 10 MG PO TABS: 10.0000 mg | ORAL_TABLET | Freq: Every day | ORAL | 3 refills | Status: AC

## 2024-01-23 NOTE — Patient Instructions (Signed)

## 2024-01-23 NOTE — Progress Notes (Signed)
 " Phone: [phone]   Subjective:  Patient 74 y.o. male presenting for annual physical.  Chief Complaint  Patient presents with   Annual Exam    Here for annual physical with PCP. No questions or concern    Medication Refill    Discussed the use of AI scribe software for clinical note transcription with the patient, who gave verbal consent to proceed.  History of Present Illness Nathaniel Bender is a 74 year old male who presents for an annual physical exam.  He has hypertension and hyperlipidemia, managed with amlodipine  10 mg, losartan  50 mg, and metoprolol  25 mg for blood pressure, and atorvastatin  40 mg and Zetia  10 mg for cholesterol. Blood pressure is generally well-controlled, though he experiences occasional dizziness in the mornings. A significant increase in blood pressure was noted after consuming a high-salt meal, which returned to normal after a few days.  He recently completed a seven-day course of prednisone  for a cough, with symptoms now resolved. He occasionally experiences diarrhea, about once a month, with no identifiable cause. Diarrhea occurred the day before the visit, attributed to eating pizza.  No major headaches, dizziness, passing out, blurry vision, sore throat, hoarseness, trouble swallowing, chest pains, heart racing, vomiting, constipation, trouble urinating, depression, or suicidal thoughts. Muscle aches are present in the bottoms of his feet.  He is active, engaging in activities such as walking 10,000 steps a day, riding motorcycles, and taking voice and guitar lessons. 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  He has received both shingles vaccinations and is up to date on pneumonia, flu, tetanus, and RSV vaccinations. He has started taking a B12 supplement daily. His family has a history of shingles, with his sister having developed Bell's palsy as a complication.    See problem oriented charting- ROS-  ROS: Gen: no fever, chills  Skin: no rash, itching ENT: no ear pain, ear drainage, nasal congestion, rhinorrhea, sinus pressure, sore throat Eyes: no blurry vision, double vision Resp: no cough, wheeze,SOB CV: no CP, palpitations, LE edema,  GI: no heartburn, n/v/d/c, abd pain GU: no dysuria, urgency, frequency, hematuria MSK: no joint pain, myalgias, back pain Neuro: no dizziness, headache, weakness, vertigo Psych: no depression, anxiety, insomnia, SI   The following were reviewed and entered/updated in epic: Past Medical History:  Diagnosis Date   Acute respiratory failure with hypoxia (HCC)    Arthritis    Degenerative Disc Disease Cervical; s/p injection [facility].   Atypical mole 05/04/2013   Right Wrist-Severe   CAD (coronary artery disease)    Chest pain 05/06/2021   Colon polyps    Hyperlipidemia    Hypertension    Kidney stone    Meniere's disease of left ear    s/p ENT evaluation; acute hearing loss; chronic tinnitus L now.  Recovered hearing completely.   Sudden idiopathic hearing loss 11/20/2011   Seen by [facility] 10/2011     Patient Active Problem List   Diagnosis Date Noted   Congenital cavus deformity of both feet 07/10/2023   Thrombocytopenia 12/29/2022   Actinic skin damage 08/20/2022   Metatarsalgia of both feet 02/21/2022   PVC (premature ventricular contraction) 10/08/2021   S/P CABG x 4 05/08/2021   Coronary artery disease 05/08/2021   History of non-ST elevation myocardial infarction (NSTEMI) 05/06/2021   DOE (dyspnea on exertion) 04/05/2021   Hypogonadism in male 01/13/2015   Prediabetes 01/13/2015   Gastroesophageal reflux disease without esophagitis 03/29/2014  Erectile dysfunction due to diseases classified elsewhere 03/29/2014   Hypertension 01/19/2014   Hyperlipidemia 01/19/2014   Past Surgical History:  Procedure Laterality Date   CLIPPING OF ATRIAL APPENDAGE Left 05/08/2021   Procedure: CLIPPING OF ATRIAL APPENDAGE;   Surgeon: Shyrl Linnie KIDD, MD;  Location: [facility] OR;  Service: Open Heart Surgery;  Laterality: Left;   CORONARY ARTERY BYPASS GRAFT N/A 05/08/2021   Procedure: CORONARY ARTERY BYPASS GRAFTING x4 USING LEFT INTERNAL MAMMORY ARTERY AND BILATERAL GREATER SAPHENOUS VEIN;  Surgeon: Shyrl Linnie KIDD, MD;  Location: [facility] OR;  Service: Open Heart Surgery;  Laterality: N/A;  atriclip placement.  flow trac   ENDOVEIN HARVEST OF GREATER SAPHENOUS VEIN Bilateral 05/08/2021   Procedure: ENDOVEIN HARVEST OF GREATER SAPHENOUS VEIN;  Surgeon: Shyrl Linnie KIDD, MD;  Location: [facility] OR;  Service: Open Heart Surgery;  Laterality: Bilateral;   LEFT HEART CATH AND CORONARY ANGIOGRAPHY N/A 05/07/2021   Procedure: LEFT HEART CATH AND CORONARY ANGIOGRAPHY;  Surgeon: Mady Bruckner, MD;  Location: [facility] INVASIVE CV LAB;  Service: Cardiovascular;  Laterality: N/A;   ROTATOR CUFF REPAIR Right    TEE WITHOUT CARDIOVERSION  05/08/2021   Procedure: TRANSESOPHAGEAL ECHOCARDIOGRAM (TEE);  Surgeon: Shyrl Linnie KIDD, MD;  Location: [facility] OR;  Service: Open Heart Surgery;;   TONSILLECTOMY      Family History  Problem Relation Age of Onset   Hyperlipidemia Mother    Hypertension Mother    Arthritis Mother 26       DDD lumbar spine   Diabetes Father    Heart disease Father 77       CAD/CABG   Hyperlipidemia Father    Hypertension Father    Arthritis Sister    Hyperlipidemia Sister    Hypertension Sister    Arthritis Maternal Grandmother    Heart disease Maternal Grandfather    Arthritis Paternal Grandmother    Mental illness Paternal Grandmother    Heart disease Paternal Grandfather    Stroke Paternal Grandfather     Medications- reviewed and updated Current Outpatient Medications  Medication Sig Dispense Refill   aspirin  (ASPIRIN  81) 81 MG chewable tablet Chew 81 mg by mouth daily.     atorvastatin  (LIPITOR ) 40 MG tablet Take 1 tablet (40 mg total) by mouth daily. 90 tablet 3   clindamycin  (CLEOCIN  T) 1 %  external solution Apply topically as needed. 30 mL 5   ezetimibe  (ZETIA ) 10 MG tablet Take 1 tablet (10 mg total) by mouth daily. 90 tablet 3   famotidine -calcium  carbonate-magnesium  hydroxide (PEPCID  COMPLETE) 10-800-165 MG chewable tablet Chew 1 tablet by mouth 2 (two) times daily as needed. 100 tablet 11   fluorouracil  (EFUDEX ) 5 % cream Apply topically twice daily for two weeks to affected areas 40 g 0   Ibuprofen-diphenhydrAMINE Cit (MOTRIN PM PO) Take by mouth.     metoprolol  succinate (TOPROL -XL) 25 MG 24 hr tablet Take 1 tablet (25 mg total) by mouth daily. 90 tablet 3   omeprazole  (PRILOSEC) 10 MG capsule TAKE 1 CAPSULE BY MOUTH DAILY 90 capsule 3   sildenafil  (VIAGRA ) 100 MG tablet Take 1 tablet (100 mg total) by mouth daily as needed for erectile dysfunction. 10 tablet 3   Turmeric (QC TUMERIC COMPLEX PO) Take by mouth daily.     amLODipine  (NORVASC ) 10 MG tablet Take 1 tablet (10 mg total) by mouth daily. 90 tablet 3   losartan  (COZAAR ) 50 MG tablet Take 1 tablet (50 mg total) by mouth daily. 90 tablet 3   No current  facility-administered medications for this visit.    Allergies-reviewed and updated Allergies[1]  Social History   Social History Narrative   Marital:  Married. X 26 years.        Children: none      Lives: with wife       Education: College/Other. Exercise: Yes.      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      Tobacco: never      Alcohol:  2-3 per day; more on weekends; wine.      Exercise:  Walking 2 days per week; 2-4 miles.      Seatbelt:  100%      Guns: 12 gauge shot gun.      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     Plays in band,  board of non-profit-[employer]   Objective  Objective:  BP 136/80 (Cuff Size: Normal)   Pulse (!) 44   Temp (!) 97.2 F (36.2 C) (Temporal)   Ht 5' 6 (1.676 m)   Wt 166 lb 12.8 oz (75.7  kg)   SpO2 98%   BMI 26.92 kg/m  Physical Exam  Gen: WDWN NAD HEENT: NCAT, conjunctiva not injected, sclera nonicteric TM WNL B, OP moist, no exudates  NECK:  supple, no thyromegaly, no nodes, no carotid bruits CARDIAC: brady RRR, S1S2+, no murmur. DP 2+B LUNGS: CTAB. No wheezes ABDOMEN:  BS+, soft, NTND, No HSM, no masses EXT:  no edema MSK: no gross abnormalities. MS 5/5 all 4 NEURO: A&O x3.  CN II-XII intact.  PSYCH: normal mood. Good eye contact     Assessment and Plan   Health Maintenance counseling: 1. Anticipatory guidance: Patient counseled regarding regular dental exams q6 months, eye exams yearly, avoiding smoking and second hand smoke, limiting alcohol to 2 beverages per day.   2. Risk factor reduction:  Advised patient of need for regular exercise and diet rich in fruits and vegetables to reduce risk of heart attack and stroke. Exercise- +.   Wt Readings from Last 3 Encounters:  01/23/24 166 lb 12.8 oz (75.7 kg)  12/04/23 160 lb (72.6 kg)  10/20/23 165 lb (74.8 kg)   3. Immunizations/screenings/ancillary studies Immunization History  Administered Date(s) Administered   Fluad Quad(high Dose 65+) 10/30/2023   INFLUENZA, HIGH DOSE SEASONAL PF 01/01/2018, 11/30/2018   Influenza Split 11/10/2011, 10/21/2012, 01/20/2016   Influenza,inj,Quad PF,6+ Mos 12/29/2013, 01/13/2015, 12/16/2016   Influenza,trivalent, recombinat, inj, PF 12/17/2022   Influenza-Unspecified 02/07/2020, 11/18/2020, 01/17/2022   PFIZER(Purple Top)SARS-COV-2 Vaccination 02/10/2019, 03/01/2019, 09/30/2019, 06/10/2020, 12/20/2020   PNEUMOCOCCAL CONJUGATE-20 02/02/2022   Pfizer(Comirnaty)Fall Seasonal Vaccine 12 years and older 09/30/2022, 10/30/2023   Pneumococcal Conjugate-13 06/02/2017   Respiratory Syncytial Virus Vaccine,Recomb Aduvanted(Arexvy) 10/17/2021   Td 01/22/2008   Tdap 03/13/2023   Zoster Recombinant(Shingrix) 02/02/2022   Zoster, Live 01/21/2010   Health Maintenance Due  Topic Date  Due   Zoster Vaccines- Shingrix (2 of 2) 03/30/2022   Colonoscopy  04/17/2024    4. Prostate cancer screening >55yo - risk factors?  Lab Results  Component Value Date   PSA 1.99 01/09/2023   PSA 1.33 08/01/2015   PSA 1.59 07/06/2014    5. Colon cancer screening:ordered 6. Skin cancer screening- Iadvised regular sunscreen use. Denies worrisome, changing, or new skin lesions.  7. Smoking associated screening (lung cancer screening, AAA screen 65-75, UA)- non smoker-  Wellness  examination  Essential hypertension, benign -     amLODIPine  Besylate; Take 1 tablet (10 mg total) by mouth daily.  Dispense: 90 tablet; Refill: 3 -     Losartan  Potassium; Take 1 tablet (50 mg total) by mouth daily.  Dispense: 90 tablet; Refill: 3  Coronary artery disease involving native coronary artery of native heart without angina pectoris  Pure hypercholesterolemia -     Lipid panel -     TSH -     Comprehensive metabolic panel with GFR  Primary hypertension -     TSH -     Comprehensive metabolic panel with GFR -     CBC with Differential/Platelet  Prediabetes -     TSH -     Comprehensive metabolic panel with GFR -     Hemoglobin A1c  Screen for colon cancer -     Ambulatory referral to Gastroenterology  Nocturia -     PSA    Assessment and Plan Assessment & Plan Essential hypertension   Blood pressure is generally well-controlled with occasional elevations due to dietary salt intake. He reports occasional morning dizziness, possibly related to a low pulse rate. Continue current antihypertensive regimen: amlodipine  10 mg, losartan  50 mg, metoprolol  25 mg. Refilled amlodipine  and losartan  prescriptions. Advised to avoid high-salt foods.  Coronary artery disease without angina   No current angina symptoms. Continues on atorvastatin  for cholesterol management. Continue atorvastatin  40 mg daily and zetia  10mg .  Pure hypercholesterolemia   Cholesterol management with atorvastatin  and Zetia   is ongoing. Continue atorvastatin  40 mg daily and Zetia  10 mg daily.  Prediabetes   A1c is slightly elevated, indicating prediabetes. Emphasis on dietary management to prevent progression. Advised on dietary modifications to manage blood sugar levels.  Gastroesophageal reflux disease   GERD symptoms are well-controlled with omeprazole  10 mg daily. No recent breakthrough symptoms. Continue omeprazole  10 mg daily. Consider weaning omeprazole  to every other day if symptoms remain controlled.  General Health Maintenance   Colonoscopy is due in March. Shingles vaccination records need updating. Pneumonia, flu, tetanus, and RSV vaccinations are up to date. Will schedule colonoscopy in March and update shingles vaccination records. Continue routine health maintenance and vaccinations.     Recommended follow up: Return in about 6 months (around 07/22/2024) for chronic follow-up.  Lab/Order associations:2 hrs cereal fasting   Jenkins CHRISTELLA Carrel, MD     [1] No Known Allergies  "

## 2024-01-25 ENCOUNTER — Ambulatory Visit: Payer: Self-pay | Admitting: Family Medicine

## 2024-01-25 NOTE — Progress Notes (Signed)
 Labs great except  Platelets stable Eosinophils a little high-probably allergies A1C(3 month average of sugars) is elevated.  This is considered PreDiabetes.  Work on diet-decrease sugars and starches and aim for 30 minutes of exercise 5 days/week to prevent progression to diabetes  Trigs a little up-could be from not fasting or the sugars-work on diet

## 2024-01-26 NOTE — Progress Notes (Signed)
Pt has read results.

## 2024-02-21 ENCOUNTER — Encounter (HOSPITAL_BASED_OUTPATIENT_CLINIC_OR_DEPARTMENT_OTHER): Payer: Self-pay | Admitting: Cardiology

## 2024-02-23 ENCOUNTER — Other Ambulatory Visit (HOSPITAL_BASED_OUTPATIENT_CLINIC_OR_DEPARTMENT_OTHER): Payer: Self-pay | Admitting: *Deleted

## 2024-02-23 DIAGNOSIS — I25118 Atherosclerotic heart disease of native coronary artery with other forms of angina pectoris: Secondary | ICD-10-CM

## 2024-02-23 MED ORDER — ATORVASTATIN CALCIUM 40 MG PO TABS: 40.0000 mg | ORAL_TABLET | Freq: Every day | ORAL | 3 refills | Status: AC

## 2024-02-24 ENCOUNTER — Encounter: Payer: Self-pay | Admitting: Dermatology

## 2024-02-27 ENCOUNTER — Ambulatory Visit

## 2024-02-27 DIAGNOSIS — M7742 Metatarsalgia, left foot: Secondary | ICD-10-CM

## 2024-02-27 DIAGNOSIS — M7741 Metatarsalgia, right foot: Secondary | ICD-10-CM

## 2024-03-01 ENCOUNTER — Ambulatory Visit: Admitting: Dermatology

## 2024-03-24 ENCOUNTER — Encounter: Admitting: Family Medicine

## 2024-07-13 ENCOUNTER — Ambulatory Visit: Admitting: Sports Medicine

## 2024-07-22 ENCOUNTER — Ambulatory Visit: Admitting: Family Medicine

## 2024-08-30 ENCOUNTER — Ambulatory Visit: Admitting: Dermatology

## 2024-10-25 ENCOUNTER — Ambulatory Visit
